# Patient Record
Sex: Male | Born: 1940 | Race: Black or African American | Hispanic: No | Marital: Married | Smoking: Former smoker
Health system: Southern US, Community
[De-identification: ages and names within clinical notes are randomized; demographics above are authoritative.]

## PROBLEM LIST (undated history)

## (undated) DIAGNOSIS — E785 Hyperlipidemia, unspecified: Secondary | ICD-10-CM

## (undated) DIAGNOSIS — I10 Essential (primary) hypertension: Secondary | ICD-10-CM

## (undated) DIAGNOSIS — I509 Heart failure, unspecified: Secondary | ICD-10-CM

## (undated) DIAGNOSIS — I2510 Atherosclerotic heart disease of native coronary artery without angina pectoris: Secondary | ICD-10-CM

## (undated) DIAGNOSIS — I4891 Unspecified atrial fibrillation: Secondary | ICD-10-CM

## (undated) DIAGNOSIS — D751 Secondary polycythemia: Secondary | ICD-10-CM

## (undated) DIAGNOSIS — I498 Other specified cardiac arrhythmias: Secondary | ICD-10-CM

## (undated) HISTORY — PX: CARDIAC CATHETERIZATION: SHX172

## (undated) HISTORY — DX: Atherosclerotic heart disease of native coronary artery without angina pectoris: I25.10

## (undated) HISTORY — PX: INSERT / REPLACE / REMOVE PACEMAKER: SUR710

## (undated) HISTORY — DX: Secondary polycythemia: D75.1

## (undated) HISTORY — DX: Unspecified atrial fibrillation: I48.91

## (undated) HISTORY — DX: Heart failure, unspecified: I50.9

## (undated) HISTORY — DX: Hyperlipidemia, unspecified: E78.5

## (undated) HISTORY — DX: Morbid (severe) obesity due to excess calories: E66.01

## (undated) HISTORY — DX: Other specified cardiac arrhythmias: I49.8

## (undated) HISTORY — DX: Essential (primary) hypertension: I10

---

## 2002-07-08 ENCOUNTER — Observation Stay (HOSPITAL_COMMUNITY): Admission: EM | Admit: 2002-07-08 | Discharge: 2002-07-08 | Payer: Self-pay | Admitting: Emergency Medicine

## 2002-07-08 ENCOUNTER — Encounter (INDEPENDENT_AMBULATORY_CARE_PROVIDER_SITE_OTHER): Payer: Self-pay | Admitting: Specialist

## 2002-07-08 ENCOUNTER — Encounter: Payer: Self-pay | Admitting: Emergency Medicine

## 2005-02-12 ENCOUNTER — Ambulatory Visit: Payer: Self-pay | Admitting: Internal Medicine

## 2005-02-19 ENCOUNTER — Ambulatory Visit: Payer: Self-pay

## 2005-03-06 ENCOUNTER — Ambulatory Visit: Payer: Self-pay | Admitting: Internal Medicine

## 2005-03-13 ENCOUNTER — Ambulatory Visit: Payer: Self-pay | Admitting: Cardiology

## 2005-03-20 ENCOUNTER — Ambulatory Visit (HOSPITAL_COMMUNITY): Admission: RE | Admit: 2005-03-20 | Discharge: 2005-03-20 | Payer: Self-pay | Admitting: Internal Medicine

## 2005-03-20 ENCOUNTER — Ambulatory Visit: Payer: Self-pay | Admitting: Internal Medicine

## 2005-03-28 ENCOUNTER — Ambulatory Visit: Payer: Self-pay | Admitting: Internal Medicine

## 2005-03-28 ENCOUNTER — Ambulatory Visit (HOSPITAL_BASED_OUTPATIENT_CLINIC_OR_DEPARTMENT_OTHER): Admission: RE | Admit: 2005-03-28 | Discharge: 2005-03-28 | Payer: Self-pay | Admitting: Internal Medicine

## 2005-04-02 ENCOUNTER — Ambulatory Visit: Payer: Self-pay | Admitting: Cardiology

## 2005-05-14 ENCOUNTER — Ambulatory Visit: Payer: Self-pay | Admitting: Internal Medicine

## 2005-05-28 ENCOUNTER — Ambulatory Visit: Payer: Self-pay | Admitting: Cardiology

## 2005-06-04 ENCOUNTER — Ambulatory Visit: Payer: Self-pay | Admitting: Internal Medicine

## 2005-06-10 ENCOUNTER — Inpatient Hospital Stay (HOSPITAL_COMMUNITY): Admission: RE | Admit: 2005-06-10 | Discharge: 2005-06-11 | Payer: Self-pay | Admitting: Internal Medicine

## 2005-06-11 ENCOUNTER — Ambulatory Visit: Payer: Self-pay | Admitting: Internal Medicine

## 2005-06-12 ENCOUNTER — Ambulatory Visit: Payer: Self-pay | Admitting: Internal Medicine

## 2005-06-19 ENCOUNTER — Ambulatory Visit: Payer: Self-pay

## 2005-06-25 ENCOUNTER — Ambulatory Visit: Payer: Self-pay | Admitting: Cardiology

## 2005-07-01 ENCOUNTER — Ambulatory Visit: Payer: Self-pay

## 2005-07-23 ENCOUNTER — Ambulatory Visit: Payer: Self-pay | Admitting: Internal Medicine

## 2005-08-13 ENCOUNTER — Ambulatory Visit: Payer: Self-pay | Admitting: Internal Medicine

## 2005-09-10 ENCOUNTER — Ambulatory Visit: Payer: Self-pay | Admitting: Internal Medicine

## 2005-10-04 ENCOUNTER — Ambulatory Visit: Payer: Self-pay | Admitting: Internal Medicine

## 2005-10-29 ENCOUNTER — Ambulatory Visit: Payer: Self-pay | Admitting: Internal Medicine

## 2006-01-28 ENCOUNTER — Ambulatory Visit: Payer: Self-pay | Admitting: Internal Medicine

## 2006-04-10 ENCOUNTER — Ambulatory Visit: Payer: Self-pay | Admitting: Internal Medicine

## 2006-04-23 ENCOUNTER — Ambulatory Visit: Payer: Self-pay

## 2006-04-23 ENCOUNTER — Encounter: Payer: Self-pay | Admitting: Cardiovascular Disease

## 2006-06-24 ENCOUNTER — Ambulatory Visit: Payer: Self-pay | Admitting: Internal Medicine

## 2006-07-01 ENCOUNTER — Ambulatory Visit: Payer: Self-pay | Admitting: Internal Medicine

## 2006-07-15 ENCOUNTER — Ambulatory Visit: Payer: Self-pay | Admitting: Internal Medicine

## 2006-08-12 ENCOUNTER — Ambulatory Visit: Payer: Self-pay | Admitting: Internal Medicine

## 2006-08-29 ENCOUNTER — Ambulatory Visit: Payer: Self-pay

## 2006-09-19 ENCOUNTER — Ambulatory Visit: Payer: Self-pay | Admitting: Cardiology

## 2006-11-17 ENCOUNTER — Ambulatory Visit: Payer: Self-pay | Admitting: Cardiology

## 2007-01-20 ENCOUNTER — Ambulatory Visit: Payer: Self-pay | Admitting: Cardiology

## 2007-01-20 LAB — CONVERTED CEMR LAB
ALT: 20 units/L (ref 0–40)
AST: 17 units/L (ref 0–37)
Albumin: 3.5 g/dL (ref 3.5–5.2)
Alkaline Phosphatase: 65 units/L (ref 39–117)
Calcium: 9.1 mg/dL (ref 8.4–10.5)
Chloride: 107 meq/L (ref 96–112)
Creatinine, Ser: 1.3 mg/dL (ref 0.4–1.5)
GFR calc non Af Amer: 59 mL/min
HDL: 44 mg/dL (ref >39.0)
Pro B Natriuretic peptide (BNP): 201 pg/mL — ABNORMAL HIGH (ref 0.0–100.0)
Sodium: 144 meq/L (ref 135–145)
Total Bilirubin: 0.6 mg/dL (ref 0.3–1.2)
VLDL: 17 mg/dL (ref 0–40)

## 2007-02-16 ENCOUNTER — Ambulatory Visit: Payer: Self-pay | Admitting: Internal Medicine

## 2007-02-25 ENCOUNTER — Ambulatory Visit: Payer: Self-pay | Admitting: Internal Medicine

## 2007-02-25 LAB — CONVERTED CEMR LAB
CO2: 35 meq/L — ABNORMAL HIGH (ref 19–32)
Calcium: 8.6 mg/dL (ref 8.4–10.5)
Chloride: 103 meq/L (ref 96–112)
Creatinine, Ser: 1.2 mg/dL (ref 0.4–1.5)
Glucose, Bld: 247 mg/dL — ABNORMAL HIGH (ref 70–99)
Sodium: 143 meq/L (ref 135–145)

## 2007-03-06 ENCOUNTER — Ambulatory Visit: Payer: Self-pay | Admitting: Internal Medicine

## 2007-03-06 LAB — CONVERTED CEMR LAB
CO2: 31 meq/L (ref 19–32)
Calcium: 8.6 mg/dL (ref 8.4–10.5)
Chloride: 104 meq/L (ref 96–112)
Creatinine, Ser: 1.4 mg/dL (ref 0.4–1.5)
GFR calc non Af Amer: 54 mL/min
Glucose, Bld: 190 mg/dL — ABNORMAL HIGH (ref 70–99)
Sodium: 142 meq/L (ref 135–145)

## 2007-05-19 ENCOUNTER — Ambulatory Visit: Payer: Self-pay | Admitting: Internal Medicine

## 2007-05-19 LAB — CONVERTED CEMR LAB
CO2: 30 meq/L (ref 19–32)
Chloride: 101 meq/L (ref 96–112)
Creatinine, Ser: 1.8 mg/dL — ABNORMAL HIGH (ref 0.4–1.5)
Potassium: 4 meq/L (ref 3.5–5.1)
Pro B Natriuretic peptide (BNP): 69 pg/mL (ref 0.0–100.0)
Sodium: 139 meq/L (ref 135–145)

## 2007-05-26 ENCOUNTER — Ambulatory Visit: Payer: Self-pay | Admitting: Internal Medicine

## 2007-05-26 ENCOUNTER — Ambulatory Visit (HOSPITAL_COMMUNITY): Admission: RE | Admit: 2007-05-26 | Discharge: 2007-05-26 | Payer: Self-pay | Admitting: Internal Medicine

## 2007-06-16 ENCOUNTER — Ambulatory Visit: Payer: Self-pay | Admitting: Internal Medicine

## 2007-06-17 ENCOUNTER — Ambulatory Visit: Payer: Self-pay | Admitting: Internal Medicine

## 2007-06-17 LAB — CONVERTED CEMR LAB
CO2: 31 meq/L (ref 19–32)
Calcium: 9 mg/dL (ref 8.4–10.5)
Chloride: 102 meq/L (ref 96–112)
Creatinine, Ser: 1.5 mg/dL (ref 0.4–1.5)
GFR calc non Af Amer: 50 mL/min
Glucose, Bld: 227 mg/dL — ABNORMAL HIGH (ref 70–99)
Sodium: 142 meq/L (ref 135–145)

## 2007-07-20 ENCOUNTER — Ambulatory Visit: Payer: Self-pay | Admitting: Internal Medicine

## 2007-07-21 ENCOUNTER — Ambulatory Visit: Payer: Self-pay | Admitting: Internal Medicine

## 2007-07-31 ENCOUNTER — Ambulatory Visit: Payer: Self-pay | Admitting: Cardiology

## 2007-08-07 ENCOUNTER — Ambulatory Visit: Payer: Self-pay | Admitting: Cardiology

## 2007-08-14 ENCOUNTER — Ambulatory Visit: Payer: Self-pay | Admitting: Cardiology

## 2007-08-20 ENCOUNTER — Ambulatory Visit: Payer: Self-pay

## 2007-08-26 ENCOUNTER — Ambulatory Visit: Payer: Self-pay | Admitting: Internal Medicine

## 2007-08-26 ENCOUNTER — Ambulatory Visit: Payer: Self-pay | Admitting: Cardiology

## 2007-09-08 ENCOUNTER — Encounter: Payer: Self-pay | Admitting: Internal Medicine

## 2007-09-08 ENCOUNTER — Ambulatory Visit: Payer: Self-pay

## 2007-09-09 ENCOUNTER — Ambulatory Visit: Payer: Self-pay | Admitting: Internal Medicine

## 2007-09-16 ENCOUNTER — Ambulatory Visit: Payer: Self-pay | Admitting: Cardiology

## 2007-10-14 ENCOUNTER — Ambulatory Visit: Payer: Self-pay | Admitting: Cardiovascular Disease

## 2007-10-20 ENCOUNTER — Ambulatory Visit: Payer: Self-pay | Admitting: Internal Medicine

## 2007-10-28 ENCOUNTER — Ambulatory Visit: Payer: Self-pay | Admitting: Internal Medicine

## 2007-11-18 ENCOUNTER — Ambulatory Visit: Payer: Self-pay | Admitting: Cardiology

## 2007-12-15 ENCOUNTER — Ambulatory Visit: Payer: Self-pay | Admitting: Internal Medicine

## 2008-01-05 ENCOUNTER — Ambulatory Visit: Payer: Self-pay | Admitting: Cardiology

## 2008-01-19 ENCOUNTER — Ambulatory Visit: Payer: Self-pay | Admitting: Internal Medicine

## 2008-01-26 ENCOUNTER — Ambulatory Visit: Payer: Self-pay | Admitting: Cardiology

## 2008-02-15 ENCOUNTER — Ambulatory Visit: Payer: Self-pay | Admitting: Cardiovascular Disease

## 2008-03-08 ENCOUNTER — Ambulatory Visit: Payer: Self-pay | Admitting: Cardiology

## 2008-03-29 ENCOUNTER — Ambulatory Visit: Payer: Self-pay | Admitting: Cardiology

## 2008-04-19 ENCOUNTER — Ambulatory Visit: Payer: Self-pay | Admitting: Internal Medicine

## 2008-04-19 ENCOUNTER — Ambulatory Visit: Payer: Self-pay | Admitting: Cardiology

## 2008-05-05 ENCOUNTER — Ambulatory Visit: Payer: Self-pay | Admitting: Internal Medicine

## 2008-05-17 ENCOUNTER — Ambulatory Visit: Payer: Self-pay | Admitting: Cardiology

## 2008-06-14 ENCOUNTER — Ambulatory Visit: Payer: Self-pay | Admitting: Cardiology

## 2008-07-12 ENCOUNTER — Ambulatory Visit: Payer: Self-pay | Admitting: Internal Medicine

## 2008-07-19 ENCOUNTER — Ambulatory Visit: Payer: Self-pay | Admitting: Internal Medicine

## 2008-08-09 ENCOUNTER — Ambulatory Visit: Payer: Self-pay | Admitting: Cardiology

## 2008-09-06 ENCOUNTER — Ambulatory Visit: Payer: Self-pay | Admitting: Cardiovascular Disease

## 2008-10-04 ENCOUNTER — Ambulatory Visit: Payer: Self-pay | Admitting: Cardiovascular Disease

## 2008-11-01 ENCOUNTER — Ambulatory Visit: Payer: Self-pay | Admitting: Cardiology

## 2008-11-22 ENCOUNTER — Encounter: Payer: Self-pay | Admitting: Internal Medicine

## 2008-11-22 ENCOUNTER — Ambulatory Visit: Payer: Self-pay | Admitting: Internal Medicine

## 2008-12-06 ENCOUNTER — Ambulatory Visit: Payer: Self-pay | Admitting: Cardiovascular Disease

## 2008-12-06 ENCOUNTER — Ambulatory Visit: Payer: Self-pay | Admitting: Internal Medicine

## 2008-12-06 ENCOUNTER — Encounter: Payer: Self-pay | Admitting: Internal Medicine

## 2008-12-12 ENCOUNTER — Encounter (INDEPENDENT_AMBULATORY_CARE_PROVIDER_SITE_OTHER): Payer: Self-pay | Admitting: *Deleted

## 2008-12-14 ENCOUNTER — Encounter: Payer: Self-pay | Admitting: Internal Medicine

## 2008-12-14 ENCOUNTER — Ambulatory Visit: Payer: Self-pay | Admitting: Internal Medicine

## 2008-12-14 DIAGNOSIS — I10 Essential (primary) hypertension: Secondary | ICD-10-CM | POA: Insufficient documentation

## 2008-12-14 DIAGNOSIS — I5022 Chronic systolic (congestive) heart failure: Secondary | ICD-10-CM

## 2008-12-14 DIAGNOSIS — I4891 Unspecified atrial fibrillation: Secondary | ICD-10-CM

## 2008-12-20 ENCOUNTER — Ambulatory Visit: Payer: Self-pay | Admitting: Cardiology

## 2008-12-23 ENCOUNTER — Ambulatory Visit: Payer: Self-pay

## 2008-12-23 ENCOUNTER — Encounter: Payer: Self-pay | Admitting: Internal Medicine

## 2009-01-03 ENCOUNTER — Ambulatory Visit: Payer: Self-pay | Admitting: Cardiovascular Disease

## 2009-01-17 ENCOUNTER — Ambulatory Visit: Payer: Self-pay | Admitting: Cardiovascular Disease

## 2009-02-01 ENCOUNTER — Ambulatory Visit: Payer: Self-pay | Admitting: Cardiology

## 2009-02-16 ENCOUNTER — Ambulatory Visit: Payer: Self-pay | Admitting: Cardiology

## 2009-03-07 ENCOUNTER — Ambulatory Visit: Payer: Self-pay | Admitting: Internal Medicine

## 2009-03-14 ENCOUNTER — Encounter: Payer: Self-pay | Admitting: *Deleted

## 2009-03-15 ENCOUNTER — Ambulatory Visit: Payer: Self-pay | Admitting: Cardiology

## 2009-03-15 LAB — CONVERTED CEMR LAB: POC INR: 2

## 2009-03-31 ENCOUNTER — Encounter: Payer: Self-pay | Admitting: Internal Medicine

## 2009-04-12 ENCOUNTER — Ambulatory Visit: Payer: Self-pay | Admitting: Cardiology

## 2009-04-12 LAB — CONVERTED CEMR LAB
POC INR: 3.3
Prothrombin Time: 22 s

## 2009-04-19 ENCOUNTER — Encounter: Payer: Self-pay | Admitting: *Deleted

## 2009-05-10 ENCOUNTER — Telehealth: Payer: Self-pay | Admitting: Internal Medicine

## 2009-05-10 ENCOUNTER — Ambulatory Visit: Payer: Self-pay | Admitting: Cardiovascular Disease

## 2009-05-10 LAB — CONVERTED CEMR LAB
POC INR: 3.4
Prothrombin Time: 22.3 s

## 2009-05-31 ENCOUNTER — Ambulatory Visit: Payer: Self-pay | Admitting: Internal Medicine

## 2009-06-06 ENCOUNTER — Ambulatory Visit: Payer: Self-pay | Admitting: Internal Medicine

## 2009-06-12 ENCOUNTER — Telehealth: Payer: Self-pay | Admitting: Internal Medicine

## 2009-06-21 ENCOUNTER — Encounter: Payer: Self-pay | Admitting: Internal Medicine

## 2009-06-21 ENCOUNTER — Ambulatory Visit: Payer: Self-pay

## 2009-07-19 ENCOUNTER — Ambulatory Visit: Payer: Self-pay | Admitting: Cardiology

## 2009-08-16 ENCOUNTER — Ambulatory Visit: Payer: Self-pay | Admitting: Cardiovascular Disease

## 2009-08-16 LAB — CONVERTED CEMR LAB: POC INR: 2.5

## 2009-09-12 ENCOUNTER — Ambulatory Visit: Payer: Self-pay | Admitting: Internal Medicine

## 2009-09-13 ENCOUNTER — Ambulatory Visit: Payer: Self-pay | Admitting: Cardiology

## 2009-09-18 ENCOUNTER — Encounter: Payer: Self-pay | Admitting: Internal Medicine

## 2009-09-21 ENCOUNTER — Ambulatory Visit: Payer: Self-pay | Admitting: Internal Medicine

## 2009-10-04 ENCOUNTER — Ambulatory Visit: Payer: Self-pay | Admitting: Cardiology

## 2009-10-04 ENCOUNTER — Ambulatory Visit: Payer: Self-pay

## 2009-10-04 ENCOUNTER — Encounter: Payer: Self-pay | Admitting: Internal Medicine

## 2009-10-04 ENCOUNTER — Ambulatory Visit (HOSPITAL_COMMUNITY): Admission: RE | Admit: 2009-10-04 | Discharge: 2009-10-04 | Payer: Self-pay | Admitting: Internal Medicine

## 2009-10-11 ENCOUNTER — Ambulatory Visit: Payer: Self-pay | Admitting: Cardiovascular Disease

## 2009-10-11 LAB — CONVERTED CEMR LAB: POC INR: 3

## 2009-11-01 ENCOUNTER — Encounter: Payer: Self-pay | Admitting: Internal Medicine

## 2009-11-02 ENCOUNTER — Ambulatory Visit: Payer: Self-pay | Admitting: Cardiology

## 2009-11-02 ENCOUNTER — Ambulatory Visit: Payer: Self-pay | Admitting: Internal Medicine

## 2009-11-02 LAB — CONVERTED CEMR LAB: POC INR: 1.9

## 2009-11-15 ENCOUNTER — Ambulatory Visit: Payer: Self-pay | Admitting: Internal Medicine

## 2009-11-15 ENCOUNTER — Encounter (INDEPENDENT_AMBULATORY_CARE_PROVIDER_SITE_OTHER): Payer: Self-pay

## 2009-11-15 LAB — CONVERTED CEMR LAB
BUN: 14 mg/dL (ref 6–23)
Basophils Absolute: 0.1 10*3/uL (ref 0.0–0.1)
CO2: 32 meq/L (ref 19–32)
Calcium: 8.4 mg/dL (ref 8.4–10.5)
Creatinine, Ser: 1.3 mg/dL (ref 0.4–1.5)
Eosinophils Absolute: 0.2 10*3/uL (ref 0.0–0.7)
Glucose, Bld: 237 mg/dL — ABNORMAL HIGH (ref 70–99)
INR: 1.9 — ABNORMAL HIGH (ref 0.8–1.0)
Lymphocytes Relative: 22.4 % (ref 12.0–46.0)
MCHC: 33 g/dL (ref 30.0–36.0)
MCV: 115 fL — ABNORMAL HIGH (ref 78.0–100.0)
Monocytes Absolute: 1 10*3/uL (ref 0.1–1.0)
Neutro Abs: 5.8 10*3/uL (ref 1.4–7.7)
Neutrophils Relative %: 63.4 % (ref 43.0–77.0)
RDW: 15.6 % — ABNORMAL HIGH (ref 11.5–14.6)
aPTT: 37.2 s — ABNORMAL HIGH (ref 21.7–28.8)

## 2009-11-22 ENCOUNTER — Ambulatory Visit (HOSPITAL_COMMUNITY): Admission: RE | Admit: 2009-11-22 | Discharge: 2009-11-23 | Payer: Self-pay | Admitting: Internal Medicine

## 2009-11-22 ENCOUNTER — Ambulatory Visit: Payer: Self-pay | Admitting: Internal Medicine

## 2009-11-30 ENCOUNTER — Ambulatory Visit: Payer: Self-pay | Admitting: Cardiovascular Disease

## 2009-12-06 ENCOUNTER — Encounter: Payer: Self-pay | Admitting: Internal Medicine

## 2009-12-06 ENCOUNTER — Ambulatory Visit: Payer: Self-pay

## 2009-12-28 ENCOUNTER — Ambulatory Visit: Payer: Self-pay | Admitting: Cardiology

## 2009-12-28 ENCOUNTER — Encounter (INDEPENDENT_AMBULATORY_CARE_PROVIDER_SITE_OTHER): Payer: Self-pay | Admitting: Cardiology

## 2010-01-25 ENCOUNTER — Ambulatory Visit: Payer: Self-pay | Admitting: Cardiology

## 2010-02-22 ENCOUNTER — Ambulatory Visit: Payer: Self-pay | Admitting: Internal Medicine

## 2010-02-22 LAB — CONVERTED CEMR LAB: POC INR: 2.2

## 2010-02-27 ENCOUNTER — Ambulatory Visit: Payer: Self-pay | Admitting: Internal Medicine

## 2010-02-27 DIAGNOSIS — Z950 Presence of cardiac pacemaker: Secondary | ICD-10-CM

## 2010-03-22 ENCOUNTER — Ambulatory Visit: Payer: Self-pay | Admitting: Cardiovascular Disease

## 2010-04-19 ENCOUNTER — Ambulatory Visit: Payer: Self-pay | Admitting: Internal Medicine

## 2010-04-19 LAB — CONVERTED CEMR LAB: POC INR: 2.1

## 2010-05-17 ENCOUNTER — Ambulatory Visit: Payer: Self-pay | Admitting: Internal Medicine

## 2010-05-17 LAB — CONVERTED CEMR LAB: POC INR: 2.7

## 2010-06-14 ENCOUNTER — Ambulatory Visit: Payer: Self-pay | Admitting: Cardiology

## 2010-06-14 LAB — CONVERTED CEMR LAB: POC INR: 2.2

## 2010-06-15 ENCOUNTER — Telehealth: Payer: Self-pay | Admitting: Internal Medicine

## 2010-06-20 ENCOUNTER — Telehealth: Payer: Self-pay | Admitting: Internal Medicine

## 2010-07-12 ENCOUNTER — Ambulatory Visit: Payer: Self-pay | Admitting: Cardiology

## 2010-07-18 ENCOUNTER — Ambulatory Visit: Payer: Self-pay | Admitting: Internal Medicine

## 2010-07-18 ENCOUNTER — Encounter: Payer: Self-pay | Admitting: Internal Medicine

## 2010-07-18 DIAGNOSIS — I959 Hypotension, unspecified: Secondary | ICD-10-CM

## 2010-07-19 ENCOUNTER — Encounter: Payer: Self-pay | Admitting: Internal Medicine

## 2010-08-09 ENCOUNTER — Ambulatory Visit: Payer: Self-pay | Admitting: Cardiology

## 2010-08-09 ENCOUNTER — Ambulatory Visit: Payer: Self-pay

## 2010-08-09 ENCOUNTER — Ambulatory Visit (HOSPITAL_COMMUNITY): Admission: RE | Admit: 2010-08-09 | Discharge: 2010-08-09 | Payer: Self-pay | Admitting: Cardiology

## 2010-08-09 ENCOUNTER — Encounter: Payer: Self-pay | Admitting: Cardiology

## 2010-08-16 ENCOUNTER — Ambulatory Visit: Payer: Self-pay | Admitting: Internal Medicine

## 2010-09-10 ENCOUNTER — Ambulatory Visit: Payer: Self-pay | Admitting: Internal Medicine

## 2010-09-10 LAB — CONVERTED CEMR LAB: POC INR: 2

## 2010-10-09 ENCOUNTER — Ambulatory Visit: Payer: Self-pay | Admitting: Cardiology

## 2010-10-09 LAB — CONVERTED CEMR LAB: POC INR: 2.5

## 2010-11-06 ENCOUNTER — Ambulatory Visit
Admission: RE | Admit: 2010-11-06 | Discharge: 2010-11-06 | Payer: Self-pay | Source: Home / Self Care | Attending: Cardiology | Admitting: Cardiology

## 2010-11-06 LAB — CONVERTED CEMR LAB: POC INR: 2.1

## 2010-11-11 LAB — CONVERTED CEMR LAB
Basophils Relative: 0.3 % (ref 0.0–3.0)
Calcium: 8.6 mg/dL (ref 8.4–10.5)
Creatinine, Ser: 1.4 mg/dL (ref 0.4–1.5)
Eosinophils Absolute: 0.2 10*3/uL (ref 0.0–0.7)
Eosinophils Relative: 1.8 % (ref 0.0–5.0)
GFR calc non Af Amer: 62.34 mL/min (ref >60)
Hemoglobin: 16.3 g/dL (ref 13.0–17.0)
Lymphocytes Relative: 20.6 % (ref 12.0–46.0)
Monocytes Relative: 11.2 % (ref 3.0–12.0)
Neutrophils Relative %: 66.1 % (ref 43.0–77.0)
Pro B Natriuretic peptide (BNP): 132.7 pg/mL — ABNORMAL HIGH (ref 0.0–100.0)
RBC: 4.17 M/uL — ABNORMAL LOW (ref 4.22–5.81)
Sodium: 140 meq/L (ref 135–145)
WBC: 11.4 10*3/uL — ABNORMAL HIGH (ref 4.5–10.5)

## 2010-11-15 ENCOUNTER — Encounter: Payer: Self-pay | Admitting: Internal Medicine

## 2010-11-15 ENCOUNTER — Ambulatory Visit: Admit: 2010-11-15 | Payer: Self-pay | Admitting: Internal Medicine

## 2010-11-15 NOTE — Medication Information (Signed)
Summary: rov/jb   Anticoagulant Therapy  Managed by: Louann Sjogren, PharmD Referring MD: Shirlee Limerick PCP: Augusto Garbe, MD Supervising MD: Eden Emms MD,Peter Indication 1: Atrial Fibrillation Lab Used: [facility] Site: [address] INR POC 2.1 INR RANGE 2 - 3  Dietary changes: no    Health status changes: no    Bleeding/hemorrhagic complications: no    Recent/future hospitalizations: no    Any changes in medication regimen? yes       Details: Cortisone shot 1 week ago in hip, did not stop coumadin  Recent/future dental: no  Any missed doses?: no       Is patient compliant with meds? yes       Allergies: No Known Drug Allergies  Anticoagulation Management History:      The patient is taking warfarin and comes in today for a routine follow up visit.  Positive risk factors for bleeding include an age of 70 years or older.  The bleeding index is 'intermediate risk'.  Positive CHADS2 values include History of CHF and History of HTN.  Negative CHADS2 values include Age > 31 years old.  The start date was 07/24/2007.  His last INR was 1.9 ratio and today's INR is 2.1.  Anticoagulation responsible provider: Eden Emms MD,Peter.  INR POC: 2.1.  Cuvette Lot#: [id number].  Exp: 09/2011.    Anticoagulation Management Assessment/Plan:      The patient's current anticoagulation dose is Coumadin 6 mg tabs: Take as directed by coumadin clinic..  The target INR is 2 - 3.  The next INR is due 12/04/2010.  Anticoagulation instructions were given to patient.  Results were reviewed/authorized by Louann Sjogren, PharmD.  He was notified by Louann Sjogren PharmD.         Prior Anticoagulation Instructions: Cont with current regimen Return to clinic on Jan 24th, at 845 am  Current Anticoagulation Instructions: INR 2.1 (goal 2-3)  Continue current regimen. Return to clinic in 4 weeks.

## 2010-11-15 NOTE — Medication Information (Signed)
Summary: [facility] Clinic  Anticoagulant Therapy  Managed by: Cloyde Reams, RN, BSN Referring MD: Shirlee Limerick PCP: Augusto Garbe, MD Supervising MD: Jens Som MD, Arlys John Indication 1: Atrial Fibrillation Lab Used: [facility] Site: [facility] INR POC 1.9 INR RANGE 2 - 3  Dietary changes: no    Health status changes: no    Bleeding/hemorrhagic complications: no    Recent/future hospitalizations: no    Any changes in medication regimen? no    Recent/future dental: no  Any missed doses?: no       Is patient compliant with meds? yes       Allergies: No Known Drug Allergies  Anticoagulation Management History:      The patient is taking warfarin and comes in today for a routine follow up visit.  Positive risk factors for bleeding include an age of 70 years or older.  The bleeding index is 'intermediate risk'.  Positive CHADS2 values include History of CHF and History of HTN.  Negative CHADS2 values include Age > 36 years old.  The start date was 07/24/2007.  His last INR was 1.9 ratio.  Anticoagulation responsible Candise Crabtree: Jens Som MD, Arlys John.  INR POC: 1.9.  Cuvette Lot#: [id number].  Exp: 09/2011.    Anticoagulation Management Assessment/Plan:      The patient's current anticoagulation dose is Coumadin 6 mg tabs: Take as directed by coumadin clinic..  The target INR is 2 - 3.  The next INR is due 09/10/2010.  Anticoagulation instructions were given to patient.  Results were reviewed/authorized by Cloyde Reams, RN, BSN.  He was notified by Cloyde Reams RN.         Prior Anticoagulation Instructions: INR 2.2  Continue taking 1 tablet everyday except take 1/2 tablet on Wednesdays. Recheck INR in 4 weeks.   Current Anticoagulation Instructions: INR 1.9  Take 1.5 tablets today, then resume same dosage 1 tablet daily except 1/2 tablet on Wednesdays.  Recheck in 4 weeks.

## 2010-11-15 NOTE — Letter (Signed)
Summary: Implantable Device Instructions  Architectural technologist, Main Office  [address]   [place], [place] [address]   Phone: [phone]  Fax: [phone]      Implantable Device Instructions  You are scheduled for:  Bi-V Pacemaker Change out  on 11/22/09 with Dr. Ladona Ridgel.  1.  Please arrive at the Short Stay Center at [facility] at 6:30am on the day of your procedure.  2.  Do not eat or drink after midnight the night before your procedure.  3.  Complete lab work on 11/15/09.  The lab at [facility] is open from 8:30 AM to 1:30 PM and from 2:30 PM to 5:00 PM.   You do not have to be fasting.  4.  Do NOT take these medications for the days of your procedure: Furosemide and Glipizide  Take your last dose of Coumadin on--will call if you need to hold  5.  Plan for an overnight stay.  Bring your insurance cards and a list of your medications.  6.  Wash your chest and neck with antibacterial soap (any brand) the evening before and the morning of your procedure.  Rinse well.  *If you have ANY questions after you get home, please call the office [phone].  Dennis Bast, RN  *Every attempt is made to prevent procedures from being rescheduled.  Due to the nauture of Electrophysiology, rescheduling can happen.  The physician is always aware and directs the staff when this occurs.

## 2010-11-15 NOTE — Medication Information (Signed)
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 no    Recent/future dental: no  Any missed doses?: no         Allergies: No Known Drug Allergies  Anticoagulation Management History:      The patient is taking warfarin and comes in today for a routine follow up visit.  Positive risk factors for bleeding include an age of 70 years or older.  The bleeding index is 'intermediate risk'.  Positive CHADS2 values include History of CHF and History of HTN.  Negative CHADS2 values include Age > 87 years old.  The start date was 07/24/2007.  His last INR was 1.9 ratio.  Anticoagulation responsible provider: Eden Emms MD, Theron Arista.  INR POC: 2.1.  Cuvette Lot#: [id number].  Exp: 01/2011.    Anticoagulation Management Assessment/Plan:      The patient's current anticoagulation dose is Coumadin 6 mg tabs: Take as directed by coumadin clinic..  The target INR is 2 - 3.  The next INR is due 12/28/2009.  Anticoagulation instructions were given to patient.  Results were reviewed/authorized by Eda Keys, PharmD.  He was notified by Eda Keys.         Prior Anticoagulation Instructions: INR 1.9 Today take 9mg ( 1.5 tablet ) then resume 6mg s everyday except 3mg s on Wednesdays. Recheck in 4 weeks.   Current Anticoagulation Instructions: INR 2.1  Continue current dosing schedule.  Take 1/2 tablet on Wednesday and take 1 tablet all other days.  Return to clinic in 4 weeks.

## 2010-11-15 NOTE — Medication Information (Signed)
Summary: rov/tm   Anticoagulant Therapy  Managed by: Weston Brass, PharmD Referring MD: Shirlee Limerick PCP: Augusto Garbe, MD Supervising MD: Jens Som MD, Arlys John Indication 1: Atrial Fibrillation Lab Used: [facility] Site: [facility] INR POC 2.2 INR RANGE 2 - 3  Dietary changes: no    Health status changes: no    Bleeding/hemorrhagic complications: no    Recent/future hospitalizations: no    Any changes in medication regimen? no    Recent/future dental: no  Any missed doses?: no       Is patient compliant with meds? yes       Allergies: No Known Drug Allergies  Anticoagulation Management History:      The patient is taking warfarin and comes in today for a routine follow up visit.  Positive risk factors for bleeding include an age of 70 years or older.  The bleeding index is 'intermediate risk'.  Positive CHADS2 values include History of CHF and History of HTN.  Negative CHADS2 values include Age > 59 years old.  The start date was 07/24/2007.  His last INR was 1.9 ratio.  Anticoagulation responsible Aarna Mihalko: Jens Som MD, Arlys John.  INR POC: 2.2.  Cuvette Lot#: [id number].  Exp: 08/2011.    Anticoagulation Management Assessment/Plan:      The patient's current anticoagulation dose is Coumadin 6 mg tabs: Take as directed by coumadin clinic..  The target INR is 2 - 3.  The next INR is due 08/09/2010.  Anticoagulation instructions were given to patient.  Results were reviewed/authorized by Weston Brass, PharmD.  He was notified by Harrel Carina, PharmD candidate.         Prior Anticoagulation Instructions: INR 2.2 Continue 6mg s everyday except 3mg s on Wednesdays. Recheck in 4 weeks.   Current Anticoagulation Instructions: INR 2.2  Continue taking 1 tablet everyday except take 1/2 tablet on Wednesdays. Recheck INR in 4 weeks.

## 2010-11-15 NOTE — Medication Information (Signed)
Summary: [name]   Anticoagulant Therapy  Managed by: Minerva Areola, RN Referring MD: Shirlee Limerick PCP: Augusto Garbe, MD Supervising MD: Eden Emms MD, Theron Arista Indication 1: Atrial Fibrillation Lab Used: [facility] Site: [address] INR POC 2.4 INR RANGE 2 - 3  Dietary changes: no    Health status changes: no    Bleeding/hemorrhagic complications: no    Recent/future hospitalizations: no    Any changes in medication regimen? no    Recent/future dental: no  Any missed doses?: no       Is patient compliant with meds? yes       Allergies: No Known Drug Allergies  Anticoagulation Management History:      The patient is taking warfarin and comes in today for a routine follow up visit.  Positive risk factors for bleeding include an age of 70 years or older.  The bleeding index is 'intermediate risk'.  Positive CHADS2 values include History of CHF and History of HTN.  Negative CHADS2 values include Age > 32 years old.  The start date was 07/24/2007.  His last INR was 1.9 ratio.  Anticoagulation responsible provider: Eden Emms MD, Theron Arista.  INR POC: 2.4.  Cuvette Lot#: [id number].  Exp: 05/2011.    Anticoagulation Management Assessment/Plan:      The patient's current anticoagulation dose is Coumadin 6 mg tabs: Take as directed by coumadin clinic..  The target INR is 2 - 3.  The next INR is due 04/19/2010.  Anticoagulation instructions were given to patient.  Results were reviewed/authorized by Minerva Areola, RN.  He was notified by Minerva Areola, RN, BSN.         Prior Anticoagulation Instructions: INR 2.2  Continue takign 1/2 tablet on Wednesday adn 1 tablet all other days.  Return to clinic in 4 weeks.    Current Anticoagulation Instructions: The patient is to continue with the same dose of coumadin.  This dosage includes: one tablet every day except a half a tablet on Wednesdays.

## 2010-11-15 NOTE — Medication Information (Signed)
Summary: rov/ewj  Anticoagulant Therapy  Managed by: Bethena Midget, RN, BSN Referring MD: Shirlee Limerick PCP: Augusto Garbe, MD Supervising MD: Jens Som MD, Arlys John Indication 1: Atrial Fibrillation Lab Used: [facility] Site: [address] INR POC 2.2 INR RANGE 2 - 3  Dietary changes: no    Health status changes: yes       Details: Severe left hip pain, Dx via X-[facility] at [facility] with Arthritis  Bleeding/hemorrhagic complications: no    Recent/future hospitalizations: no    Any changes in medication regimen? no    Recent/future dental: no  Any missed doses?: no       Is patient compliant with meds? yes       Allergies: No Known Drug Allergies  Anticoagulation Management History:      The patient is taking warfarin and comes in today for a routine follow up visit.  Positive risk factors for bleeding include an age of 70 years or older.  The bleeding index is 'intermediate risk'.  Positive CHADS2 values include History of CHF and History of HTN.  Negative CHADS2 values include Age > 71 years old.  The start date was 07/24/2007.  His last INR was 1.9 ratio.  Anticoagulation responsible provider: Jens Som MD, Arlys John.  INR POC: 2.2.  Cuvette Lot#: [id number].  Exp: 07/2011.    Anticoagulation Management Assessment/Plan:      The patient's current anticoagulation dose is Coumadin 6 mg tabs: Take as directed by coumadin clinic..  The target INR is 2 - 3.  The next INR is due 07/12/2010.  Anticoagulation instructions were given to patient.  Results were reviewed/authorized by Bethena Midget, RN, BSN.  He was notified by Bethena Midget, RN, BSN.         Prior Anticoagulation Instructions: INR 2.7  Continue on same dosage 1 tablet daily except 1/2 tablet on Wednesdays.  Recheck in 4 weeks.    Current Anticoagulation Instructions: INR 2.2 Continue 6mg s everyday except 3mg s on Wednesdays. Recheck in 4 weeks.

## 2010-11-15 NOTE — Medication Information (Signed)
Summary: rov/tm  Anticoagulant Therapy  Managed by: Bethena Midget, RN, BSN Referring MD: Shirlee Limerick Supervising MD: Riley Kill MD, Maisie Fus Indication 1: Atrial Fibrillation Lab Used: [facility] Site: [facility] INR POC 1.9 INR RANGE 2 - 3  Dietary changes: yes       Details: Has been eating more leafy green vegetables.   Health status changes: no    Bleeding/hemorrhagic complications: no    Recent/future hospitalizations: no    Any changes in medication regimen? no    Recent/future dental: no  Any missed doses?: no       Is patient compliant with meds? yes      Comments: Seeing Dr. Ladona Ridgel today.   Allergies: No Known Drug Allergies  Anticoagulation Management History:      The patient is taking warfarin and comes in today for a routine follow up visit.  Positive risk factors for bleeding include an age of 70 years or older.  The bleeding index is 'intermediate risk'.  Positive CHADS2 values include History of CHF and History of HTN.  Negative CHADS2 values include Age > 25 years old.  The start date was 07/24/2007.  His last INR was 2.3.  Anticoagulation responsible provider: Riley Kill MD, Maisie Fus.  INR POC: 1.9.  Cuvette Lot#: [id number].  Exp: 11/2010.    Anticoagulation Management Assessment/Plan:      The patient's current anticoagulation dose is Coumadin 6 mg tabs: Take as directed by coumadin clinic..  The target INR is 2 - 3.  The next INR is due 11/30/2009.  Anticoagulation instructions were given to patient.  Results were reviewed/authorized by Bethena Midget, RN, BSN.  He was notified by Bethena Midget, RN, BSN.         Prior Anticoagulation Instructions: INR 3.0 Continue 6mg s everyday except 3mg s on Wednesdays. Recheck in 4 weeks.   Current Anticoagulation Instructions: INR 1.9 Today take 9mg ( 1.5 tablet ) then resume 6mg s everyday except 3mg s on Wednesdays. Recheck in 4 weeks.

## 2010-11-15 NOTE — Medication Information (Signed)
Summary: rov/ewj   Anticoagulant Therapy  Managed by: Lyna Poser, PharmD Referring MD: Shirlee Limerick PCP: Augusto Garbe, MD Supervising MD: Tenny Craw MD,Paula Indication 1: Atrial Fibrillation Lab Used: [facility] Site: [address] INR POC 2 INR RANGE 2 - 3  Dietary changes: no    Health status changes: yes       Details: hip pain  Bleeding/hemorrhagic complications: no    Recent/future hospitalizations: no    Any changes in medication regimen? no    Recent/future dental: no  Any missed doses?: no       Is patient compliant with meds? yes       Allergies: No Known Drug Allergies  Anticoagulation Management History:      The patient is taking warfarin and comes in today for a routine follow up visit.  Positive risk factors for bleeding include an age of 70 years or older.  The bleeding index is 'intermediate risk'.  Positive CHADS2 values include History of CHF and History of HTN.  Negative CHADS2 values include Age > 78 years old.  The start date was 07/24/2007.  His last INR was 1.9 ratio.  Anticoagulation responsible provider: Tenny Craw MD,Paula.  INR POC: 2.  Cuvette Lot#: [id number].  Exp: 09/2011.    Anticoagulation Management Assessment/Plan:      The patient's current anticoagulation dose is Coumadin 6 mg tabs: Take as directed by coumadin clinic..  The target INR is 2 - 3.  The next INR is due 10/09/2010.  Anticoagulation instructions were given to patient.  Results were reviewed/authorized by Lyna Poser, PharmD.         Prior Anticoagulation Instructions: INR 1.9  Take 1.5 tablets today, then resume same dosage 1 tablet daily except 1/2 tablet on Wednesdays.  Recheck in 4 weeks.    Current Anticoagulation Instructions: INR 2 Continue taking a half tablet on wednesday. And 1 tablet all other days. Recheck in 4 weeks.

## 2010-11-15 NOTE — Medication Information (Signed)
Summary: rov/sp  Anticoagulant Therapy  Managed by: Eda Keys, PharmD Referring MD: Shirlee Limerick PCP: Augusto Garbe, MD Supervising MD: [name] MD, Reuel Boom Indication 1: Atrial Fibrillation Lab Used: [facility] Site: [address] INR POC 2.2 INR RANGE 2 - 3  Dietary changes: no    Health status changes: no    Bleeding/hemorrhagic complications: no    Recent/future hospitalizations: no    Any changes in medication regimen? no    Recent/future dental: no  Any missed doses?: no       Is patient compliant with meds? yes       Allergies: No Known Drug Allergies  Anticoagulation Management History:      The patient is taking warfarin and comes in today for a routine follow up visit.  Positive risk factors for bleeding include an age of 70 years or older.  The bleeding index is 'intermediate risk'.  Positive CHADS2 values include History of CHF and History of HTN.  Negative CHADS2 values include Age > 2 years old.  The start date was 07/24/2007.  His last INR was 1.9 ratio.  Anticoagulation responsible provider: Marsden Zaino MD, Reuel Boom.  INR POC: 2.2.  Cuvette Lot#: [id number].  Exp: 05/2011.    Anticoagulation Management Assessment/Plan:      The patient's current anticoagulation dose is Coumadin 6 mg tabs: Take as directed by coumadin clinic..  The target INR is 2 - 3.  The next INR is due 03/22/2010.  Anticoagulation instructions were given to patient.  Results were reviewed/authorized by Eda Keys, PharmD.  He was notified by Eda Keys.         Prior Anticoagulation Instructions: INR 2.1  Continue same dose of 1 tablet every day except 1/2 tablet on Wednesday   Current Anticoagulation Instructions: INR 2.2  Continue takign 1/2 tablet on Wednesday adn 1 tablet all other days.  Return to clinic in 4 weeks.    Appended Document: [facility]     CXR  Procedure date:  11/23/2009  Findings:        Clinical Data: Left ventricular dysfunction.  Post  pacemaker   insertion.    CHEST - 2 VIEW    Comparison: 06/11/2005.    Findings: In the interval since the prior exam, the pacemaker power   pack has been changed and an additional right ventricular apex   pacing lead has been placed.  Old AICD leads and coronary sinus   lead remain unchanged.  Right atrial appendage lead is also   unchanged.  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  Mild right basilar atelectasis.  Mediastinal contours   unchanged.  No pneumothorax.    IMPRESSION:   Uncomplicated revision of left subclavian pacemaker.  No   pneumothorax.    Read By:  Wynn Banker   Released By:  Wynn Banker

## 2010-11-15 NOTE — Assessment & Plan Note (Signed)
Summary: 1 YR F/U [employer]      Allergies Added: NKDA  Visit Type:  Follow-up Primary Provider:  Augusto Garbe, MD   History of Present Illness: [name] returns today for followup.  He is a pleasant 70 yo man with a h/o DCM, CHF, HTN, and obesity.  The patient initially had an EF of 20-25% but after BiV ICD implant and uptitration of meds, has an EF of 40-50%.  His CHF remains class 1-2.  He remains active.  He has experienced no intercurrent ICD therapies.  No peripheral edema. His ICD has reached ERI.  Current Medications (verified): 1)  Digoxin 0.125 Mg Tabs (Digoxin) .... Take 1  Tablet By Mouth Daily 2)  Carvedilol 25 Mg Tabs (Carvedilol) .... Take 2 Tablet By Mouth Twice A Day 3)  Aspirin 81 Mg Tbec (Aspirin) .... Take One Tablet By Mouth Daily 4)  Simvastatin 20 Mg Tabs (Simvastatin) .... Take One Tablet By Mouth Daily At Bedtime 5)  Furosemide 80 Mg Tabs (Furosemide) .... Take One Tablet By Mouth Two Times A Day 6)  Allopurinol 300 Mg Tabs (Allopurinol) .... Take 1 Tab Every Day 7)  Glipizide Xl 5 Mg Tb24 (Glipizide) .... 2  Tablet By Mouth Two Times A Day 8)  Benazepril Hcl 40 Mg  Tabs (Benazepril Hcl) .Marland Kitchen.. 1 Tablet By Mouth Daily 9)  Terazosin Hcl 2 Mg  Caps (Terazosin Hcl) .Marland Kitchen.. 1 By Mouth At Bedtime 10)  Coumadin 6 Mg Tabs (Warfarin Sodium) .... Take As Directed By Coumadin Clinic. 11)  Viagra 100 Mg Tabs (Sildenafil Citrate) .... As Needed  Allergies (verified): No Known Drug Allergies  Past History:  Past Medical History: Last updated: 09/21/2009  1. Congestive heart failure secondary to nonischemic cardiomyopathy       a. EF currently 50% by ECHO 11/08 (previous 30-35%)       b. status post Medtronict biventricular ICD placement.(with Sprint Fidelis lead)       c. CPX with peak VO2 9.5  2. Morbid obesity.  3. Hypertension.  4. Hyperlipidemia.  5. Diabetes.  6. 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  [facility]  7. History of wandering atrial pacemaker.  8. Chronic atrial fibrillation  Review of Systems  The patient denies chest pain, syncope, dyspnea on exertion, and peripheral edema.    Vital Signs:  Patient profile:   70 year old male Height:      75 inches Weight:      274 pounds BMI:     34.37 Pulse rate:   76 / minute BP sitting:   96 / 82  Vitals Entered By: Laurance Flatten CMA (November 02, 2009 11:24 AM)  Physical Exam  General:  Gen: well appearing. no resp difficulty HEENT: normal Neck: supple. no JVD. Carotids 2+ bilat; not bruits. No lymphadenopathy or thryomegaly appreciated. Cor: PMI nonpalpabe. Regular rate & rhythm. No rubs, gallops, murmur. Lungs: clear Abdomen: soft, nontender, nondistended. No hepatosplenomegaly. No bruits or masses. Good bowel sounds. Extremities: no cyanosis, clubbing, rash, edema Neuro: alert & orientedx3, cranial nerves grossly intact. moves all 4 extremities w/o difficulty. affect pleasant     ICD Specifications ICD Vendor:  Medtronic     ICD Model Number:  7304     ICD Serial Number:  [id number] H ICD DOI:  06/10/2005      Lead 1:    Location: RA  DOI: 06/10/2005     Model #: 2130     Serial #: [id number]     Status: active Lead 2:    Location: RV     DOI: 06/10/2005     Model #: 9528     Serial #: [id number]     Status: active Lead 3:    Location: LV     DOI: 06/10/2005     Model #: 7253     Serial #: [id number]     Status: active  Indications::  NICM  Explantation Comments: LIA [facility] MD Comments:  Review of old records demonstrates that his only ICD shock was for double counting of the T wave.  Impression & Recommendations:  Problem # 1:  AUTOMATIC IMPLANTABLE CARDIAC DEFIBRILLATOR SITU (ICD-V45.02) His device is working normally but is at [employer].  AS his LV function has almost normalized, his CHF is well controlled and he has had no sustained VT, I have recommended BiV PPM.  This will be  scheduled in several weeks.  Problem # 2:  ATRIAL FIBRILLATION (ICD-427.31) No syptoms and none seen on interogation of his device. His updated medication list for this problem includes:    Digoxin 0.125 Mg Tabs (Digoxin) .Marland Kitchen... Take 1  tablet by mouth daily    Carvedilol 25 Mg Tabs (Carvedilol) .Marland Kitchen... Take 2 tablet by mouth twice a day    Aspirin 81 Mg Tbec (Aspirin) .Marland Kitchen... Take one tablet by mouth daily    Coumadin 6 Mg Tabs (Warfarin sodium) .Marland Kitchen... Take as directed by coumadin clinic.  Problem # 3:  SYSTOLIC HEART FAILURE, CHRONIC (ICD-428.22) His symptoms are well controlled.  Will continue current meds and a low sodium diet is recommended. His updated medication list for this problem includes:    Digoxin 0.125 Mg Tabs (Digoxin) .Marland Kitchen... Take 1  tablet by mouth daily    Carvedilol 25 Mg Tabs (Carvedilol) .Marland Kitchen... Take 2 tablet by mouth twice a day    Aspirin 81 Mg Tbec (Aspirin) .Marland Kitchen... Take one tablet by mouth daily    Furosemide 80 Mg Tabs (Furosemide) .Marland Kitchen... Take one tablet by mouth two times a day    Benazepril Hcl 40 Mg Tabs (Benazepril hcl) .Marland Kitchen... 1 tablet by mouth daily    Coumadin 6 Mg Tabs (Warfarin sodium) .Marland Kitchen... Take as directed by coumadin clinic.

## 2010-11-15 NOTE — Assessment & Plan Note (Signed)
Summary: 9 mo f/u  Medications Added CARVEDILOL 25 MG TABS (CARVEDILOL) ONE TWICE A DAY FUROSEMIDE 20 MG TABS (FUROSEMIDE) ONE DAILY TRAMADOL HCL 50 MG TABS (TRAMADOL HCL) 1 by mouth as needed      Allergies Added: NKDA  Primary Provider:  Augusto Garbe, MD   History of Present Illness: [name] returns today for followup.  He is a pleasant 70 yo man with a h/o DCM, CHF, HTN, atrial arrythmias and obesity.  The patient initially had an EF of 20-25% but after BiV ICD implant and uptitration of meds, has an EF of 40-50%.  His CHF remains class 1-2. With improvement in his LV function, he had a BiV PM placed earlier this year.    Says overall he is doing well.  Denies c/p.  No significant edema. No PND or orthopnea. Wife says he is very sedentary and she thinks he is not doing as well. Main problems is pain in left hip. When he goes out he is using a wheelchair. No syncope. presyncope or palpitations.   Preventive Screening-Counseling & Management  Comments: BS 117 IV NS infusing into right anticubal without difficulty Repeat BP 129/88 with large cuff, left arm.  Current Medications (verified): 1)  Digoxin 0.125 Mg Tabs (Digoxin) .... Take 1  Tablet By Mouth Daily 2)  Carvedilol 25 Mg Tabs (Carvedilol) .... Take 2 Tablet By Mouth Twice A Day 3)  Aspirin 81 Mg Tbec (Aspirin) .... Take One Tablet By Mouth Daily 4)  Simvastatin 20 Mg Tabs (Simvastatin) .... Take One Tablet By Mouth Daily At Bedtime 5)  Furosemide 80 Mg Tabs (Furosemide) .... Take One Tablet By Mouth Two Times A Day 6)  Allopurinol 300 Mg Tabs (Allopurinol) .... Take 1 Tab Every Day 7)  Glipizide Xl 5 Mg Tb24 (Glipizide) .... 2  Tablet By Mouth Two Times A Day 8)  Benazepril Hcl 40 Mg  Tabs (Benazepril Hcl) .Marland Kitchen.. 1 Tablet By Mouth Daily 9)  Terazosin Hcl 2 Mg  Caps (Terazosin Hcl) .Marland Kitchen.. 1 By Mouth At Bedtime 10)  Coumadin 6 Mg Tabs (Warfarin Sodium) .... Take As Directed By Coumadin Clinic. 11)  Viagra 100 Mg Tabs (Sildenafil  Citrate) .... As Needed 12)  Bayer Breeze 2 Test  Disk (Glucose Blood) .... Test Sugar Three Times A Day 13)  Tramadol Hcl 50 Mg Tabs (Tramadol Hcl) .Marland Kitchen.. 1 By Mouth As Needed  Allergies (verified): No Known Drug Allergies  Past History:  Past Medical History: Last updated: 09/21/2009  1. Congestive heart failure secondary to nonischemic cardiomyopathy       a. EF currently 50% by ECHO 11/08 (previous 30-35%)       b. status post Medtronict biventricular ICD placement.(with Sprint Fidelis lead)       c. CPX with peak VO2 9.5  2. Morbid obesity.  3. Hypertension.  4. Hyperlipidemia.  5. Diabetes.  6. 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  [facility]  7. History of wandering atrial pacemaker.  8. Chronic atrial fibrillation  Review of Systems       As per HPI and past medical history; otherwise all systems negative.   Vital Signs:  Patient profile:   70 year old male Height:      75 inches Weight:      264 pounds BMI:     33.12 Pulse rate:   72 / minute Resp:  18 per minute BP sitting:   60 /   Vitals Entered By: Chad Coy, CNA (July 18, 2010 2:18 PM)   Serial Vital Signs/Assessments:  Time      Position  BP       Pulse  Resp  Temp     By                     60/                            Chad Coy, CNA  Comments: Dopplered By: Chad Coy, CNA    Physical Exam  General:  weak appearing. no resp difficulty. skin damp HEENT: normal Neck: supple. no JVD. Carotids 2+ bilat; not bruits. No lymphadenopathy or thryomegaly appreciated. Cor: PMI nonpalpabe. Regular rate & rhythm. No rubs, gallops, murmur. Lungs: clear. Abdomen: soft, nontender, nondistended. No hepatosplenomegaly. No bruits or masses. Good bowel sounds. Extremities: no cyanosis, clubbing, rash, edema Neuro: alert & orientedx3, cranial nerves grossly intact. moves all 4 extremities w/o  difficulty. affect pleasant    PPM Specifications Following MD:  Chad Bunting, MD     PPM Vendor:  St Jude     PPM Model Number:  [phone]     PPM Serial Number:  [id number] PPM DOI:  11/22/2009     PPM Implanting MD:  Chad Bunting, MD  Lead 1    Location: RA     DOI: 06/10/2005     Model #: 4782     Serial #: [id number]     Status: active Lead 2    Location: RV     DOI: 11/22/2009     Model #: [id number]     Serial #: [id number]     Status: active Lead 3    Location: LV     DOI: 06/10/2005     Model #: 4194     Serial #: [id number]     Status: active  Magnet Response Rate:  BOL 100 ERI  85  Indications:  CM   PPM Follow Up Pacer Dependent:  No      Episodes Coumadin:  Yes  Parameters Mode:  DDIR     Lower Rate Limit:  70     Upper Rate Limit:  120 Paced AV Delay:  170      ICD Specifications Following MD:  Chad Bunting, MD     ICD Vendor:  Medtronic     ICD Model Number:  7304     ICD Serial Number:  [id number] H ICD DOI:  06/10/2005      Lead 1:    Location: RA     DOI: 06/10/2005     Model #: 4259     Serial #: [id number]     Status: active Lead 2:    Location: RV     DOI: 06/10/2005     Model #: 2951     Serial #: [id number]     Status: active Lead 3:    Location: LV     DOI: 06/10/2005     Model #: 4194     Serial #: [id number]     Status: active  Indications::  NICM  Explantation Comments: 11/22/2009 Medtronic [id number] h explanted  Episodes Coumadin:  Yes  Brady Parameters Mode DDIR     Lower Rate Limit:  70     PAV 170     Impression & Recommendations:  Problem # 1:  HYPOTENSION, UNSPECIFIED (ICD-458.9) SBP in 60s by Doppler. Will place IV and give 2 liters NS. Cut lasix back from 40 once daily to 20 once daily. Decrease carvedilol to 25 two times a day. Check BMET and CBC. Will recehck echo.  Problem # 2:  SYSTOLIC HEART FAILURE, CHRONIC (ICD-428.22) EF improved. Hard to assess functional capacity due to ortho problems and his minimizing symptoms. suspect he is class  III.   Problem # 3:  ATRIAL FIBRILLATION (ICD-427.31) Chronic. Rate controlled. Continue coumadin.   Other Orders: EKG w/ Interpretation (93000) T-Digoxin (11914-78295) TLB-BNP (B-Natriuretic Peptide) (83880-BNPR) TLB-BMP (Basic Metabolic Panel-BMET) (80048-METABOL) TLB-CBC Platelet - w/Differential (85025-CBCD) Echocardiogram (Echo)  Patient Instructions: 1)  Your physician recommends that you schedule a follow-up appointment in: 1 month with Dr [name] 2)  Your physician recommends that you  have  lab work today BNP,BMP, dig level and CBC 3)  Your physician has recommended you make the following change in your medication: Decrease Furosemide to 20 mg a day  and carvediolol 25 mg twice a day 4)  Your physician has requested that you have an echocardiogram.  Echocardiography is a painless test that uses sound waves to create images of your heart. It provides your doctor with information about the size and shape of your heart and how well your heart's chambers and valves are working.  This procedure takes approximately one hour. There are no restrictions for this procedure. Prescriptions: FUROSEMIDE 20 MG TABS (FUROSEMIDE) ONE DAILY  #30 x 6   Entered by:   Charolotte Capuchin, RN   Authorized by:   Dolores Patty, MD, [facility]   Signed by:   Charolotte Capuchin, RN on 07/18/2010   Method used:   Electronically to        [facility] [phone]* (retail)       [address]       [place], [place]  [address]       Ph: [phone] or [phone]       Fax: [phone]   RxID:   [id number]

## 2010-11-15 NOTE — Medication Information (Signed)
Summary: rov/ln  Anticoagulant Therapy  Managed by: Cloyde Reams, RN, BSN Referring MD: Shirlee Limerick PCP: Augusto Garbe, MD Supervising MD: Tenny Craw MD, Gunnar Fusi Indication 1: Atrial Fibrillation Lab Used: [facility] Site: [facility] INR POC 2.7 INR RANGE 2 - 3  Dietary changes: no    Health status changes: no    Bleeding/hemorrhagic complications: no    Recent/future hospitalizations: no    Any changes in medication regimen? no    Recent/future dental: no  Any missed doses?: no       Is patient compliant with meds? yes       Allergies: No Known Drug Allergies  Anticoagulation Management History:      The patient is taking warfarin and comes in today for a routine follow up visit.  Positive risk factors for bleeding include an age of 71 years or older.  The bleeding index is 'intermediate risk'.  Positive CHADS2 values include History of CHF and History of HTN.  Negative CHADS2 values include Age > 71 years old.  The start date was 07/24/2007.  His last INR was 1.9 ratio.  Anticoagulation responsible Yunis Voorheis: Tenny Craw MD, Gunnar Fusi.  INR POC: 2.7.  Cuvette Lot#: [id number].  Exp: 07/2011.    Anticoagulation Management Assessment/Plan:      The patient's current anticoagulation dose is Coumadin 6 mg tabs: Take as directed by coumadin clinic..  The target INR is 2 - 3.  The next INR is due 06/14/2010.  Anticoagulation instructions were given to patient.  Results were reviewed/authorized by Cloyde Reams, RN, BSN.  He was notified by Cloyde Reams RN.         Prior Anticoagulation Instructions: INR 2.1  Continue same dose of 0.5 tab on Wednesday and 1 tab all other days.    Current Anticoagulation Instructions: INR 2.7  Continue on same dosage 1 tablet daily except 1/2 tablet on Wednesdays.  Recheck in 4 weeks.

## 2010-11-15 NOTE — Progress Notes (Signed)
Summary: talk to nurse   Phone Note Call from Patient   Caller: Patient Reason for Call: Talk to Nurse Summary of Call: pt wants to add a med to his med list-tramadol 50 mg 1 tab 3 x a day-also wants a nurse call-pls call [phone] Initial call taken by: Glynda Jaeger,  June 15, 2010 9:43 AM  Follow-up for Phone Call        Spoke with pt. Pt called to make sure tramadol 50 mg is safe for him to take. Pt's PCP prescrived the medication yesterday for back pain. I re-assured pt. that Tramadol is a mild pain medication it works in the muscle pain and is safe to take. Pt. verbalized understanding Follow-up by: Ollen Gross, RN, BSN,  June 15, 2010 10:01 AM

## 2010-11-15 NOTE — Medication Information (Signed)
Summary: rov/eac  Anticoagulant Therapy  Managed by: [name], PharmD, BCPS, CPP Referring MD: Shirlee Limerick PCP: Augusto Garbe, MD Supervising MD: Shirlee Latch MD, Dalton Indication 1: Atrial Fibrillation Lab Used: [facility] Site: [facility] INR POC 2.2 INR RANGE 2 - 3  Dietary changes: no    Health status changes: no    Bleeding/hemorrhagic complications: no    Recent/future hospitalizations: no    Any changes in medication regimen? no    Recent/future dental: no  Any missed doses?: no       Is patient compliant with meds? yes       Allergies (verified): No Known Drug Allergies  Anticoagulation Management History:      The patient is taking warfarin and comes in today for a routine follow up visit.  Positive risk factors for bleeding include an age of 35 years or older.  The bleeding index is 'intermediate risk'.  Positive CHADS2 values include History of CHF and History of HTN.  Negative CHADS2 values include Age > 37 years old.  The start date was 07/24/2007.  His last INR was 1.9 ratio.  Anticoagulation responsible provider: Shirlee Latch MD, Dalton.  INR POC: 2.2.  Cuvette Lot#: [id number].  Exp: 02/2011.    Anticoagulation Management Assessment/Plan:      The patient's current anticoagulation dose is Coumadin 6 mg tabs: Take as directed by coumadin clinic..  The target INR is 2 - 3.  The next INR is due 01/25/2010.  Anticoagulation instructions were given to patient.  Results were reviewed/authorized by [name], PharmD, BCPS, CPP.  He was notified by [name] PharmD, BCPS, CPP.         Prior Anticoagulation Instructions: INR 2.1  Continue current dosing schedule.  Take 1/2 tablet on Wednesday and take 1 tablet all other days.  Return to clinic in 4 weeks.   Current Anticoagulation Instructions: INR 2.2  Take 1 tab daily except 0.5 tab Wednesday.  Recheck 4 weeks.

## 2010-11-15 NOTE — Medication Information (Signed)
Summary: rov  js  Anticoagulant Therapy  Managed by: Weston Brass, PharmD Referring MD: Shirlee Limerick PCP: Augusto Garbe, MD Supervising MD: Tenny Craw MD, Gunnar Fusi Indication 1: Atrial Fibrillation Lab Used: [facility] Site: [facility] INR POC 2.1 INR RANGE 2 - 3  Dietary changes: no    Health status changes: no    Bleeding/hemorrhagic complications: no    Recent/future hospitalizations: no    Any changes in medication regimen? no    Recent/future dental: no  Any missed doses?: no       Is patient compliant with meds? yes       Allergies: No Known Drug Allergies  Anticoagulation Management History:      Positive risk factors for bleeding include an age of 71 years or older.  The bleeding index is 'intermediate risk'.  Positive CHADS2 values include History of CHF and History of HTN.  Negative CHADS2 values include Age > 71 years old.  The start date was 07/24/2007.  His last INR was 1.9 ratio.  Anticoagulation responsible provider: Tenny Craw MD, Gunnar Fusi.  INR POC: 2.1.  Cuvette Lot#: [id number].  Exp: 06/2011.    Anticoagulation Management Assessment/Plan:      The patient's current anticoagulation dose is Coumadin 6 mg tabs: Take as directed by coumadin clinic..  The target INR is 2 - 3.  The next INR is due 05/17/2010.  Anticoagulation instructions were given to patient.  Results were reviewed/authorized by Weston Brass, PharmD.  He was notified by Weston Brass PharmD.         Prior Anticoagulation Instructions: The patient is to continue with the same dose of coumadin.  This dosage includes: one tablet every day except a half a tablet on Wednesdays.  Current Anticoagulation Instructions: INR 2.1  Continue same dose of 0.5 tab on Wednesday and 1 tab all other days.

## 2010-11-15 NOTE — Procedures (Signed)
Summary: Cardiology Device Clinic      Allergies Added: NKDA  Current Medications (verified): 1)  Digoxin 0.125 Mg Tabs (Digoxin) .... Take 1  Tablet By Mouth Daily 2)  Carvedilol 25 Mg Tabs (Carvedilol) .... Take 2 Tablet By Mouth Twice A Day 3)  Aspirin 81 Mg Tbec (Aspirin) .... Take One Tablet By Mouth Daily 4)  Simvastatin 20 Mg Tabs (Simvastatin) .... Take One Tablet By Mouth Daily At Bedtime 5)  Furosemide 20 Mg Tabs (Furosemide) .... One Daily 6)  Allopurinol 300 Mg Tabs (Allopurinol) .... Take 1 Tab Every Day 7)  Glipizide Xl 5 Mg Tb24 (Glipizide) .... 2  Tablet By Mouth Two Times A Day 8)  Benazepril Hcl 40 Mg  Tabs (Benazepril Hcl) .Marland Kitchen.. 1 Tablet By Mouth Daily 9)  Terazosin Hcl 2 Mg  Caps (Terazosin Hcl) .Marland Kitchen.. 1 By Mouth At Bedtime 10)  Coumadin 6 Mg Tabs (Warfarin Sodium) .... Take As Directed By Coumadin Clinic. 11)  Viagra 100 Mg Tabs (Sildenafil Citrate) .... As Needed 12)  Bayer Breeze 2 Test  Disk (Glucose Blood) .... Test Sugar Three Times A Day  Allergies (verified): No Known Drug Allergies  PPM Specifications Following MD:  Lewayne Bunting, MD     PPM Vendor:  St Jude     PPM Model Number:  [phone]     PPM Serial Number:  [id number] PPM DOI:  11/22/2009     PPM Implanting MD:  Lewayne Bunting, MD  Lead 1    Location: RA     DOI: 06/10/2005     Model #: 8413     Serial #: [id number]     Status: active Lead 2    Location: RV     DOI: 11/22/2009     Model #: [id number]     Serial #: [id number]     Status: active Lead 3    Location: LV     DOI: 06/10/2005     Model #: 3875     Serial #: [id number]     Status: active  Magnet Response Rate:  BOL 100 ERI  85  Indications:  CM   PPM Follow Up Battery Voltage:  2.96 V     Battery Est. Longevity:  6.6-6.9 yrs     Pacer Dependent:  No       PPM Device Measurements Atrium  Amplitude: 1.6 mV, Impedance: 580 ohms,  Right Ventricle  Amplitude: 12.0 mV, Impedance: 530 ohms, Threshold: 0.75 V at 0.5 msec Left Ventricle  Impedance: 730  ohms, Threshold: 1.25 V at 0.5 msec  Episodes MS Episodes:  1     Percent Mode Switch:  100%     Coumadin:  Yes Ventricular High Rate:  0     Atrial Pacing:  2.8%     Ventricular Pacing:  98%  Parameters Mode:  DDIR     Lower Rate Limit:  70     Upper Rate Limit:  120 Paced AV Delay:  170     Next Cardiology Appt Due:  11/14/2010 Tech Comments:  PT IN AF SINCE 02-27-10. + COUMADIN.  NORMAL DEVICE FUNCTION.  NO CHANGES MADE. ROV IN FEB 2012 W/GT. Vella Kohler  July 19, 2010 3:04 PM  ICD Specifications Following MD:  Lewayne Bunting, MD     ICD Vendor:  Medtronic     ICD Model Number:  7304     ICD Serial Number:  [id number] ICD DOI:  06/10/2005      Lead 1:  Location: RA     DOI: 06/10/2005     Model #: 1610     Serial #: [id number]     Status: active Lead 2:    Location: RV     DOI: 06/10/2005     Model #: 1914     Serial #: [id number]     Status: active Lead 3:    Location: LV     DOI: 06/10/2005     Model #: 4194     Serial #: [id number]     Status: active  Indications::  NICM  Explantation Comments: 11/22/2009 Medtronic [id number] h explanted  Episodes Coumadin:  Yes  Brady Parameters Mode DDIR     Lower Rate Limit:  70     PAV 170

## 2010-11-15 NOTE — Letter (Signed)
Summary: Handout Printed  Printed Handout:  - Coumadin Instructions-w/out Meds 

## 2010-11-15 NOTE — Assessment & Plan Note (Signed)
Summary: pc2/jml      Allergies Added: NKDA  Visit Type:  Follow-up Primary Provider:  Augusto Garbe, MD   History of Present Illness: [name] returns today for followup.  He is a pleasant 70 yo man with a h/o DCM, CHF, HTN, and obesity.  The patient initially had an EF of 20-25% but after BiV ICD implant and uptitration of meds, has an EF of 40-50%.  His CHF remains class 1-2. With improvement in his LV function, he had a BiV PM placed several months ago.  He denies c/p.  He has minimal peripheral edema. No PND or orthopnea.  Current Medications (verified): 1)  Digoxin 0.125 Mg Tabs (Digoxin) .... Take 1  Tablet By Mouth Daily 2)  Carvedilol 25 Mg Tabs (Carvedilol) .... Take 2 Tablet By Mouth Twice A Day 3)  Aspirin 81 Mg Tbec (Aspirin) .... Take One Tablet By Mouth Daily 4)  Simvastatin 20 Mg Tabs (Simvastatin) .... Take One Tablet By Mouth Daily At Bedtime 5)  Furosemide 80 Mg Tabs (Furosemide) .... Take One Tablet By Mouth Two Times A Day 6)  Allopurinol 300 Mg Tabs (Allopurinol) .... Take 1 Tab Every Day 7)  Glipizide Xl 5 Mg Tb24 (Glipizide) .... 2  Tablet By Mouth Two Times A Day 8)  Benazepril Hcl 40 Mg  Tabs (Benazepril Hcl) .Marland Kitchen.. 1 Tablet By Mouth Daily 9)  Terazosin Hcl 2 Mg  Caps (Terazosin Hcl) .Marland Kitchen.. 1 By Mouth At Bedtime 10)  Coumadin 6 Mg Tabs (Warfarin Sodium) .... Take As Directed By Coumadin Clinic. 11)  Viagra 100 Mg Tabs (Sildenafil Citrate) .... As Needed  Allergies (verified): No Known Drug Allergies  Past History:  Past Medical History: Last updated: 09/21/2009  1. Congestive heart failure secondary to nonischemic cardiomyopathy       a. EF currently 50% by ECHO 11/08 (previous 30-35%)       b. status post Medtronict biventricular ICD placement.(with Sprint Fidelis lead)       c. CPX with peak VO2 9.5  2. Morbid obesity.  3. Hypertension.  4. Hyperlipidemia.  5. Diabetes.  6. 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  [facility]  7. History of wandering atrial pacemaker.  8. Chronic atrial fibrillation  Review of Systems  The patient denies chest pain, syncope, dyspnea on exertion, and peripheral edema.    Vital Signs:  Patient profile:   70 year old male Height:      75 inches Weight:      272 pounds BMI:     34.12 Pulse rate:   71 / minute BP sitting:   122 / 80  (left arm)  Vitals Entered By: Laurance Flatten CMA (Feb 27, 2010 9:30 AM) Comments Pt thinks that he may have something in his eye.  Pt also wants his throat looked at.   Physical Exam  General:  Gen: well appearing. no resp difficulty HEENT: normal Neck: supple. no JVD. Carotids 2+ bilat; not bruits. No lymphadenopathy or thryomegaly appreciated. Cor: PMI nonpalpabe. Regular rate & rhythm. No rubs, gallops, murmur. Lungs: clear. Well healed PPM incision. Abdomen: soft, nontender, nondistended. No hepatosplenomegaly. No bruits or masses. Good bowel sounds. Extremities: no cyanosis, clubbing, rash, edema Neuro: alert & orientedx3, cranial nerves grossly intact. moves all 4 extremities w/o difficulty. affect pleasant    PPM Specifications Following MD:  Lewayne Bunting, MD  PPM Vendor:  St Jude     PPM Model Number:  [id number]     PPM Serial Number:  [id number] PPM DOI:  11/22/2009     PPM Implanting MD:  Lewayne Bunting, MD  Lead 1    Location: RA     DOI: 06/10/2005     Model #: 5784     Serial #: [id number]     Status: active Lead 2    Location: RV     DOI: 11/22/2009     Model #: [id number]     Serial #: [id number]     Status: active Lead 3    Location: LV     DOI: 06/10/2005     Model #: 4034     Serial #: [id number]     Status: active  Magnet Response Rate:  BOL 100 ERI  85  Indications:  CM   PPM Follow Up Pacer Dependent:  No      Episodes Coumadin:  Yes  Parameters Mode:  DDIR     Lower Rate Limit:  70     Upper Rate Limit:  120 Paced AV Delay:  170     MD Comments:  Agree  with above. 98% V. Paced.  ICD Specifications Following MD:  Lewayne Bunting, MD     ICD Vendor:  Medtronic     ICD Model Number:  7304     ICD Serial Number:  [id number] H ICD DOI:  06/10/2005      Lead 1:    Location: RA     DOI: 06/10/2005     Model #: 5076     Serial #: [id number]     Status: active Lead 2:    Location: RV     DOI: 06/10/2005     Model #: 0160     Serial #: [id number]     Status: active Lead 3:    Location: LV     DOI: 06/10/2005     Model #: 4194     Serial #: [id number]     Status: active  Indications::  NICM  Explantation Comments: 11/22/2009 Medtronic [id number] h explanted  ICD Follow Up Battery Voltage:  2.98 V     Battery Est. Longevity:  7.4-7.18yrs Underlying rhythm:  AFIB   ICD Device Measurements Atrium:  Amplitude: 3.1 mV, Impedance: 490 ohms,  Right Ventricle:  Amplitude: 12.0 mV, Impedance: 460 ohms, Threshold: 0.75 V at 0.5 msec Left Ventricle:  Impedance: 660 ohms, Threshold: 1.125 V at 0.5 msec  Episodes MS Episodes:  0     Percent Mode Switch:  0     Coumadin:  Yes Shock:  0     ATP:  0     Nonsustained:  0     Atrial Therapies:  0 Atrial Pacing:  2.8%     Ventricular Pacing:  98%  Brady Parameters Mode DDIR     Lower Rate Limit:  70     PAV 170     Next Cardiology Appt Due:  11/14/2010 Tech Comments:  NORMAL DEVICE FUNCTION.  NO CHANGES MADE.   PT IN AFIB. +COUMADIN.  NEXT CHECK IN FEB 2012. Vella Kohler  Feb 27, 2010 9:51 AM  Impression & Recommendations:  Problem # 1:  SYSTOLIC HEART FAILURE, CHRONIC (ICD-428.22) His CHF symptoms remain class 1-2. He is non-compliant with his diet and I have encouraged him to maintain a low sodium diet.  Will recheck in several months. His updated medication list for this problem includes:  Digoxin 0.125 Mg Tabs (Digoxin) .Marland Kitchen... Take 1  tablet by mouth daily    Carvedilol 25 Mg Tabs (Carvedilol) .Marland Kitchen... Take 2 tablet by mouth twice a day    Aspirin 81 Mg Tbec (Aspirin) .Marland Kitchen... Take one tablet by mouth  daily    Furosemide 80 Mg Tabs (Furosemide) .Marland Kitchen... Take one tablet by mouth two times a day    Benazepril Hcl 40 Mg Tabs (Benazepril hcl) .Marland Kitchen... 1 tablet by mouth daily    Coumadin 6 Mg Tabs (Warfarin sodium) .Marland Kitchen... Take as directed by coumadin clinic.  Problem # 2:  CARDIAC PACEMAKER IN SITU (ICD-V45.01) His device is working normally.  Will recheck in several months.  Problem # 3:  ATRIAL FIBRILLATION (ICD-427.31) At this point he will remain in atrial fib.  His rate is well controlled.  Continue current meds. His updated medication list for this problem includes:    Digoxin 0.125 Mg Tabs (Digoxin) .Marland Kitchen... Take 1  tablet by mouth daily    Carvedilol 25 Mg Tabs (Carvedilol) .Marland Kitchen... Take 2 tablet by mouth twice a day    Aspirin 81 Mg Tbec (Aspirin) .Marland Kitchen... Take one tablet by mouth daily    Coumadin 6 Mg Tabs (Warfarin sodium) .Marland Kitchen... Take as directed by coumadin clinic.  Patient Instructions: 1)  Your physician recommends that you schedule a follow-up appointment in: 12 months with Dr Ladona Ridgel

## 2010-11-15 NOTE — Procedures (Signed)
Summary: wound check      Allergies Added: NKDA  Current Medications (verified): 1)  Digoxin 0.125 Mg Tabs (Digoxin) .... Take 1  Tablet By Mouth Daily 2)  Carvedilol 25 Mg Tabs (Carvedilol) .... Take 2 Tablet By Mouth Twice A Day 3)  Aspirin 81 Mg Tbec (Aspirin) .... Take One Tablet By Mouth Daily 4)  Simvastatin 20 Mg Tabs (Simvastatin) .... Take One Tablet By Mouth Daily At Bedtime 5)  Furosemide 80 Mg Tabs (Furosemide) .... Take One Tablet By Mouth Two Times A Day 6)  Allopurinol 300 Mg Tabs (Allopurinol) .... Take 1 Tab Every Day 7)  Glipizide Xl 5 Mg Tb24 (Glipizide) .... 2  Tablet By Mouth Two Times A Day 8)  Benazepril Hcl 40 Mg  Tabs (Benazepril Hcl) .Marland Kitchen.. 1 Tablet By Mouth Daily 9)  Terazosin Hcl 2 Mg  Caps (Terazosin Hcl) .Marland Kitchen.. 1 By Mouth At Bedtime 10)  Coumadin 6 Mg Tabs (Warfarin Sodium) .... Take As Directed By Coumadin Clinic. 11)  Viagra 100 Mg Tabs (Sildenafil Citrate) .... As Needed  Allergies (verified): No Known Drug Allergies  PPM Specifications Following MD:  Lewayne Bunting, MD     PPM Vendor:  St Jude     PPM Model Number:  [phone]     PPM Serial Number:  [id number] PPM DOI:  11/22/2009     PPM Implanting MD:  Lewayne Bunting, MD  Lead 1    Location: RA     DOI: 06/10/2005     Model #: 9811     Serial #: [id number]     Status: active Lead 2    Location: RV     DOI: 11/22/2009     Model #: [id number]     Serial #: [id number]     Status: active Lead 3    Location: LV     DOI: 06/10/2005     Model #: 2841     Serial #: [id number]     Status: active  Magnet Response Rate:  BOL 100 ERI  85  Indications:  CM   PPM Follow Up Remote Check?  No Battery Voltage:  3.13 V     Battery Est. Longevity:  8.1 years     Pacer Dependent:  No       PPM Device Measurements Atrium  Amplitude: 0.6 mV, Impedance: 450 ohms,  Right Ventricle  Amplitude: 12 mV, Impedance: 580 ohms, Threshold: 0.625 V at 0.5 msec Left Ventricle  Impedance: 660 ohms, Threshold: 1.0 V at 0.5  msec  Episodes Percent Mode Switch:  100%     Coumadin:  Yes Atrial Pacing:  2.6%     Ventricular Pacing:  98%  Parameters Mode:  DDIR     Lower Rate Limit:  70     Upper Rate Limit:  120 Paced AV Delay:  170     Next Cardiology Appt Due:  02/11/2010 Tech Comments:  Steri strips removed, no redness, some edema noted.   A-fib , + coumadin.  No parameter changes.  Activity restrictions reviewed with the patient and he is in agreement.  ROV 3 months Dr. Ladona Ridgel. Altha Harm, LPN  December 06, 2009 11:56 AM   ICD Specifications Following MD:  Lewayne Bunting, MD     ICD Vendor:  Medtronic     ICD Model Number:  7304     ICD Serial Number:  [id number] ICD DOI:  06/10/2005      Lead 1:    Location: RA  DOI: 06/10/2005     Model #: 0454     Serial #: [id number]     Status: active Lead 2:    Location: RV     DOI: 06/10/2005     Model #: 2956     Serial #: [id number]     Status: active Lead 3:    Location: LV     DOI: 06/10/2005     Model #: 4194     Serial #: [id number]     Status: active  Indications::  NICM  Explantation Comments: 11/22/2009 Medtronic [id number] h explanted  ICD Follow Up Remote Check?  No

## 2010-11-15 NOTE — Progress Notes (Signed)
Summary: rx request   Phone Note Refill Request   Refills Requested: Medication #1:  DIGOXIN 0.125 MG TABS Take 1  tablet by mouth daily  Medication #2:  SIMVASTATIN 20 MG TABS Take one tablet by mouth daily at bedtime pt states [facility] [address] says they still do not have these refills    Method Requested: Telephone to Pharmacy Initial call taken by: Glynda Jaeger,  June 20, 2010 9:55 AM  Follow-up for Phone Call        spoke with Autumn at [facility] -- she says they did not recieve electronic response, refilled digoxin and simva x 3  told them to send the test strips to his PCP. They do not have one on file so I told her that when I talked to the Pt I would tell him he needs to call them in regards to this. Hardin Negus, RMA  June 20, 2010 1:27 PM   Pt says that we always give his test strips script, told him I would call Herbert Seta and ask her to call him back and let him know what we will do. Follow-up by: Hardin Negus, RMA,  June 20, 2010 1:35 PM  Additional Follow-up for Phone Call Additional follow up Details #1::        Will do breeze scripts Additional Follow-up by: Hardin Negus, RMA,  June 25, 2010 12:59 PM

## 2010-11-15 NOTE — Miscellaneous (Signed)
Summary: Sweaty in lobby, here  for labs, pt wants BS checked  On arrival here in lobby for pre-op lab work the patient complaining of feeling sweaty and wants his BS checked to make sure it isn't low. CBG checked was 203, and the patient had taken his insulin 3 hrs ago. The patient notified of his BS, and not having any other complaints. The triage nurse(Melanie) notified, and flag sent to Dr. Rosette Reveal. P. Kameisha Malicki,RN.

## 2010-11-15 NOTE — Medication Information (Signed)
Summary: [email or web address]  Anticoagulant Therapy  Managed by: Weston Brass, PharmD Referring MD: Shirlee Limerick PCP: Augusto Garbe, MD Supervising MD: Jens Som MD, Arlys John Indication 1: Atrial Fibrillation Lab Used: [facility] Site: [facility] INR POC 2.1 INR RANGE 2 - 3  Dietary changes: no    Health status changes: no    Bleeding/hemorrhagic complications: no    Recent/future hospitalizations: no    Any changes in medication regimen? no    Recent/future dental: no  Any missed doses?: no       Is patient compliant with meds? yes       Allergies: No Known Drug Allergies  Anticoagulation Management History:      The patient is taking warfarin and comes in today for a routine follow up visit.  Positive risk factors for bleeding include an age of 71 years or older.  The bleeding index is 'intermediate risk'.  Positive CHADS2 values include History of CHF and History of HTN.  Negative CHADS2 values include Age > 71 years old.  The start date was 07/24/2007.  His last INR was 1.9 ratio.  Anticoagulation responsible provider: Jens Som MD, Arlys John.  INR POC: 2.1.  Cuvette Lot#: [id number].  Exp: 02/2011.    Anticoagulation Management Assessment/Plan:      The patient's current anticoagulation dose is Coumadin 6 mg tabs: Take as directed by coumadin clinic..  The target INR is 2 - 3.  The next INR is due 02/22/2010.  Anticoagulation instructions were given to patient.  Results were reviewed/authorized by Weston Brass, PharmD.  He was notified by Weston Brass PharmD.         Prior Anticoagulation Instructions: INR 2.2  Take 1 tab daily except 0.5 tab Wednesday.  Recheck 4 weeks.    Current Anticoagulation Instructions: INR 2.1  Continue same dose of 1 tablet every day except 1/2 tablet on Wednesday

## 2010-11-15 NOTE — Cardiovascular Report (Signed)
Summary: Office Visit   Office Visit   Imported By: Roderic Ovens 07/26/2010 15:18:43  _____________________________________________________________________  External Attachment:    Type:   Image     Comment:   External Document

## 2010-11-15 NOTE — Assessment & Plan Note (Signed)
Summary: 1 month rov  with 2 D Echo 428.22      Allergies Added: NKDA  Primary Provider:  Augusto Garbe, MD   History of Present Illness: [name] returns today for followup.  He is a pleasant 70 yo man with a h/o DCM, CHF, HTN, atrial arrythmias (now chronic AF) and obesity.  The patient initially had an EF of 20-25% and single chamber ICD was implanted. EF then recovered to 50% and had ICD explanted and BiV pacer placed due to bradycardia.  We saw last month in clinic and was hypotenisve with SBP in 60s. Given IVFs and lasix and coreg cut in half.  Follow-up echo showed EF back down to 25-30%.   Says overall he is feelinng much better. Takes BP every day. SBP typically 120-130 but up as high as 170.  No significant edema. No PND or orthopnea. He remains very sedentary and uses wheelchair when he is up a long while. Hip some better.  No syncope. presyncope or palpitations.   Current Medications (verified): 1)  Digoxin 0.125 Mg Tabs (Digoxin) .... Take 1  Tablet By Mouth Daily 2)  Carvedilol 25 Mg Tabs (Carvedilol) .... One Twice A Day 3)  Aspirin 81 Mg Tbec (Aspirin) .... Take One Tablet By Mouth Daily 4)  Simvastatin 20 Mg Tabs (Simvastatin) .... Take One Tablet By Mouth Daily At Bedtime 5)  Furosemide 20 Mg Tabs (Furosemide) .... One Daily 6)  Allopurinol 300 Mg Tabs (Allopurinol) .... Take 1 Tab Every Day 7)  Glipizide Xl 5 Mg Tb24 (Glipizide) .... 2  Tablet By Mouth Two Times A Day 8)  Benazepril Hcl 40 Mg  Tabs (Benazepril Hcl) .Marland Kitchen.. 1 Tablet By Mouth Daily 9)  Terazosin Hcl 2 Mg  Caps (Terazosin Hcl) .Marland Kitchen.. 1 By Mouth At Bedtime 10)  Coumadin 6 Mg Tabs (Warfarin Sodium) .... Take As Directed By Coumadin Clinic. 11)  Viagra 100 Mg Tabs (Sildenafil Citrate) .... As Needed 12)  Bayer Breeze 2 Test  Disk (Glucose Blood) .... Test Sugar Three Times A Day 13)  Tramadol Hcl 50 Mg Tabs (Tramadol Hcl) .Marland Kitchen.. 1 By Mouth As Needed  Allergies (verified): No Known Drug Allergies  Past  History:  Past Medical History:  1. Congestive heart failure secondary to nonischemic cardiomyopathy       a. EF currently 50% by ECHO 11/08 (previous 30-35%)       b. status post Medtronict biventricular ICD placement.(with Sprint Fidelis lead)       c. CPX with peak VO2 9.5       d. ECHO 10/11 EF 25-30%  2. Morbid obesity.  3. Hypertension.  4. Hyperlipidemia.  5. Diabetes.  6. 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  [facility]  7. History of wandering atrial pacemaker.  8. Chronic atrial fibrillation  Review of Systems       As per HPI and past medical history; otherwise all systems negative.   Vital Signs:  Patient profile:   70 year old male Height:      75 inches Weight:      267 pounds BMI:     33.49 Pulse rate:   102 / minute Pulse rhythm:   regular Resp:     18 per minute BP sitting:   132 / 92  (right arm)  Vitals Entered By: Marrion Coy, CNA (August 16, 2010 9:44  AM)  Physical Exam  General:  well appearing. no resp difficulty. skin damp HEENT: normal Neck: supple. no JVD. Carotids 2+ bilat; not bruits. No lymphadenopathy or thryomegaly appreciated. Cor: PMI nonpalpabe. Regular rate & rhythm. No rubs, gallops, murmur. Lungs: clear. Abdomen: soft, nontender, nondistended. No hepatosplenomegaly. No bruits or masses. Good bowel sounds. Extremities: no cyanosis, clubbing, rash, edema Neuro: alert & orientedx3, cranial nerves grossly intact. moves all 4 extremities w/o difficulty. affect pleasant    PPM Specifications Following MD:  Lewayne Bunting, MD     PPM Vendor:  St Jude     PPM Model Number:  [id number]     PPM Serial Number:  [id number] PPM DOI:  11/22/2009     PPM Implanting MD:  Lewayne Bunting, MD  Lead 1    Location: RA     DOI: 06/10/2005     Model #: 5621     Serial #: [id number]     Status: active Lead 2    Location: RV     DOI: 11/22/2009     Model #: 6295MW      Serial #: [id number]     Status: active Lead 3    Location: LV     DOI: 06/10/2005     Model #: 4194     Serial #: [id number]     Status: active  Magnet Response Rate:  BOL 100 ERI  85  Indications:  CM   PPM Follow Up Pacer Dependent:  No      Episodes Coumadin:  Yes  Parameters Mode:  DDIR     Lower Rate Limit:  70     Upper Rate Limit:  120 Paced AV Delay:  170      ICD Specifications Following MD:  Lewayne Bunting, MD     ICD Vendor:  Medtronic     ICD Model Number:  7304     ICD Serial Number:  [id number] H ICD DOI:  06/10/2005      Lead 1:    Location: RA     DOI: 06/10/2005     Model #: 6387     Serial #: [id number]     Status: active Lead 2:    Location: RV     DOI: 06/10/2005     Model #: 8416     Serial #: [id number]     Status: active Lead 3:    Location: LV     DOI: 06/10/2005     Model #: 4194     Serial #: [id number]     Status: active  Indications::  NICM  Explantation Comments: 11/22/2009 Medtronic [id number] h explanted  Episodes Coumadin:  Yes  Brady Parameters Mode DDIR     Lower Rate Limit:  70     PAV 170     Impression & Recommendations:  Problem # 1:  SYSTOLIC HEART FAILURE, CHRONIC (ICD-428.22) EF back down for unclear reasons. I am wondering if it may be due to underlying AF but this seems unlikely as his ventricular rates have been well controlled but temporally the two seem correlated. Currently NYHA class III. Volume status ok. Remains on very good meds. Will repeat echo in 3 months and if EF remains depressed will need to consider upgrading to BiV ICD. Previous CPX showed Vo2 9.5 but overall fairly poor candidate for advanced therapies but if fx status drops dramatically may have to consider LVAD.  Problem # 2:  HYPERTENSION, BENIGN (ICD-401.1) BP labile. Will continue to follow. May need ambulatory BP  montior at some point.   Problem # 3:  ATRIAL FIBRILLATION (ICD-427.31) Chronic. Continue coumadin.   Patient Instructions: 1)  Your physician  recommends that you schedule a follow-up appointment in: 3 months 2)  Your physician recommends that you continue on your current medications as directed. Please refer to the Current Medication list given to you today.

## 2010-11-15 NOTE — Medication Information (Signed)
Summary: [name]      Allergies Added: NKDA Anticoagulant Therapy  Managed by: Samantha Crimes, PharmD Referring MD: Shirlee Limerick PCP: Augusto Garbe, MD Supervising MD: Myrtis Ser MD, Tinnie Gens Indication 1: Atrial Fibrillation Lab Used: [facility] Site: [address] INR POC 2.5 INR RANGE 2 - 3  Dietary changes: no    Health status changes: no    Bleeding/hemorrhagic complications: no    Recent/future hospitalizations: no    Any changes in medication regimen? no    Recent/future dental: no  Any missed doses?: no       Is patient compliant with meds? yes      Comments: possible hip surgery before next visit, will check with [place] after appointment in a week or so  Current Medications (verified): 1)  Digoxin 0.125 Mg Tabs (Digoxin) .... Take 1  Tablet By Mouth Daily 2)  Carvedilol 25 Mg Tabs (Carvedilol) .... One Twice A Day 3)  Aspirin 81 Mg Tbec (Aspirin) .... Take One Tablet By Mouth Daily 4)  Simvastatin 20 Mg Tabs (Simvastatin) .... Take One Tablet By Mouth Daily At Bedtime 5)  Furosemide 20 Mg Tabs (Furosemide) .... One Daily 6)  Allopurinol 300 Mg Tabs (Allopurinol) .... Take 1 Tab Every Day 7)  Glipizide Xl 5 Mg Tb24 (Glipizide) .... 2  Tablet By Mouth Two Times A Day 8)  Benazepril Hcl 40 Mg  Tabs (Benazepril Hcl) .Marland Kitchen.. 1 Tablet By Mouth Daily 9)  Terazosin Hcl 2 Mg  Caps (Terazosin Hcl) .Marland Kitchen.. 1 By Mouth At Bedtime 10)  Coumadin 6 Mg Tabs (Warfarin Sodium) .... Take As Directed By Coumadin Clinic. 11)  Viagra 100 Mg Tabs (Sildenafil Citrate) .... As Needed 12)  Bayer Breeze 2 Test  Disk (Glucose Blood) .... Test Sugar Three Times A Day 13)  Tramadol Hcl 50 Mg Tabs (Tramadol Hcl) .Marland Kitchen.. 1 By Mouth As Needed  Allergies (verified): No Known Drug Allergies  Anticoagulation Management History:      Positive risk factors for bleeding include an age of 72 years or older.  The bleeding index is 'intermediate risk'.  Positive CHADS2 values include History of CHF and History of HTN.   Negative CHADS2 values include Age > 4 years old.  The start date was 07/24/2007.  His last INR was 1.9 ratio.  Anticoagulation responsible provider: Myrtis Ser MD, Tinnie Gens.  INR POC: 2.5.  Exp: 09/2011.    Anticoagulation Management Assessment/Plan:      The patient's current anticoagulation dose is Coumadin 6 mg tabs: Take as directed by coumadin clinic..  The target INR is 2 - 3.  The next INR is due 11/06/2010.  Anticoagulation instructions were given to patient.  Results were reviewed/authorized by Samantha Crimes, PharmD.         Prior Anticoagulation Instructions: INR 2 Continue taking a half tablet on wednesday. And 1 tablet all other days. Recheck in 4 weeks.  Current Anticoagulation Instructions: Cont with current regimen Return to clinic on Jan 24th, at 845 am

## 2010-11-15 NOTE — Cardiovascular Report (Signed)
Summary: [facility]    Cardiology   Imported By: Roderic Ovens 03/16/2010 15:44:10  _____________________________________________________________________  External Attachment:    Type:   Image     Comment:   External Document

## 2010-11-15 NOTE — Cardiovascular Report (Signed)
Summary: Pre Op Orders  Pre Op Orders   Imported By: Roderic Ovens 11/08/2009 10:34:57  _____________________________________________________________________  External Attachment:    Type:   Image     Comment:   External Document

## 2010-11-15 NOTE — Cardiovascular Report (Signed)
Summary: Office Visit   Office Visit   Imported By: Roderic Ovens 12/22/2009 12:10:07  _____________________________________________________________________  External Attachment:    Type:   Image     Comment:   External Document

## 2010-11-15 NOTE — Cardiovascular Report (Signed)
Summary: Office Visit   Office Visit   Imported By: Roderic Ovens 11/08/2009 14:02:11  _____________________________________________________________________  External Attachment:    Type:   Image     Comment:   External Document

## 2010-11-21 ENCOUNTER — Encounter (INDEPENDENT_AMBULATORY_CARE_PROVIDER_SITE_OTHER): Payer: Medicare Other

## 2010-11-21 ENCOUNTER — Encounter: Payer: Self-pay | Admitting: Internal Medicine

## 2010-11-21 DIAGNOSIS — I428 Other cardiomyopathies: Secondary | ICD-10-CM

## 2010-12-04 ENCOUNTER — Encounter: Payer: Self-pay | Admitting: Cardiology

## 2010-12-04 ENCOUNTER — Encounter (INDEPENDENT_AMBULATORY_CARE_PROVIDER_SITE_OTHER): Payer: Medicare Other

## 2010-12-04 DIAGNOSIS — I4891 Unspecified atrial fibrillation: Secondary | ICD-10-CM

## 2010-12-04 DIAGNOSIS — Z7901 Long term (current) use of anticoagulants: Secondary | ICD-10-CM

## 2010-12-04 LAB — CONVERTED CEMR LAB: POC INR: 2.1

## 2010-12-05 ENCOUNTER — Encounter: Payer: Self-pay | Admitting: Internal Medicine

## 2010-12-05 ENCOUNTER — Ambulatory Visit (INDEPENDENT_AMBULATORY_CARE_PROVIDER_SITE_OTHER): Payer: Medicare Other | Admitting: Internal Medicine

## 2010-12-05 ENCOUNTER — Other Ambulatory Visit: Payer: Self-pay | Admitting: Internal Medicine

## 2010-12-05 DIAGNOSIS — I4891 Unspecified atrial fibrillation: Secondary | ICD-10-CM

## 2010-12-05 DIAGNOSIS — R0602 Shortness of breath: Secondary | ICD-10-CM

## 2010-12-05 DIAGNOSIS — I5022 Chronic systolic (congestive) heart failure: Secondary | ICD-10-CM

## 2010-12-05 LAB — CBC WITH DIFFERENTIAL/PLATELET
Basophils Absolute: 0.1 10*3/uL (ref 0.0–0.1)
Hemoglobin: 12.8 g/dL — ABNORMAL LOW (ref 13.0–17.0)
Lymphocytes Relative: 25.2 % (ref 12.0–46.0)
Monocytes Relative: 11 % (ref 3.0–12.0)
Neutro Abs: 4.6 10*3/uL (ref 1.4–7.7)
Neutrophils Relative %: 61.5 % (ref 43.0–77.0)
RDW: 16.9 % — ABNORMAL HIGH (ref 11.5–14.6)

## 2010-12-05 LAB — BASIC METABOLIC PANEL
BUN: 12 mg/dL (ref 6–23)
Calcium: 8.6 mg/dL (ref 8.4–10.5)
GFR: 77.6 mL/min (ref >60.00)
Potassium: 3.6 mEq/L (ref 3.5–5.1)
Sodium: 142 mEq/L (ref 135–145)

## 2010-12-05 LAB — BRAIN NATRIURETIC PEPTIDE: Pro B Natriuretic peptide (BNP): 634.1 pg/mL — ABNORMAL HIGH (ref 0.0–100.0)

## 2010-12-05 NOTE — Procedures (Signed)
Summary: pacer ck moved from [facility] schedule /mt      Allergies Added: NKDA  Current Medications (verified): 1)  Digoxin 0.125 Mg Tabs (Digoxin) .... Take 1  Tablet By Mouth Daily 2)  Carvedilol 25 Mg Tabs (Carvedilol) .... One Twice A Day 3)  Aspirin 81 Mg Tbec (Aspirin) .... Take One Tablet By Mouth Daily 4)  Simvastatin 20 Mg Tabs (Simvastatin) .... Take One Tablet By Mouth Daily At Bedtime 5)  Furosemide 20 Mg Tabs (Furosemide) .... One Daily 6)  Allopurinol 300 Mg Tabs (Allopurinol) .... Take 1 Tab Every Day 7)  Glipizide Xl 5 Mg Tb24 (Glipizide) .... 2  Tablet By Mouth Two Times A Day 8)  Benazepril Hcl 40 Mg  Tabs (Benazepril Hcl) .Marland Kitchen.. 1 Tablet By Mouth Daily 9)  Terazosin Hcl 2 Mg  Caps (Terazosin Hcl) .Marland Kitchen.. 1 By Mouth At Bedtime 10)  Coumadin 6 Mg Tabs (Warfarin Sodium) .... Take As Directed By [facility] Clinic. 11)  Viagra 100 Mg Tabs (Sildenafil Citrate) .... As Needed 12)  Bayer Breeze 2 Test  Disk (Glucose Blood) .... Test Sugar Three Times A Day 13)  Tramadol Hcl 50 Mg Tabs (Tramadol Hcl) .Marland Kitchen.. 1 By Mouth As Needed  Allergies (verified): No Known Drug Allergies  PPM Specifications Following MD:  Lewayne Bunting, MD     PPM Vendor:  St Jude     PPM Model Number:  [id number]     PPM Serial Number:  [id number] PPM DOI:  11/22/2009     PPM Implanting MD:  Lewayne Bunting, MD  Lead 1    Location: RA     DOI: 06/10/2005     Model #: [id number]     Serial #: [id number]     Status: active Lead 2    Location: RV     DOI: 11/22/2009     Model #: [id number]     Serial #: [id number]     Status: active Lead 3    Location: LV     DOI: 06/10/2005     Model #: 2841     Serial #: [id number]     Status: active  Magnet Response Rate:  BOL 100 ERI  85  Indications:  CM   PPM Follow Up Remote Check?  No Battery Voltage:  2.95 V     Battery Est. Longevity:  5.6 years     Pacer Dependent:  No       PPM Device Measurements Atrium  Amplitude: 1.0 mV, Impedance: 460 ohms,  Right Ventricle  Amplitude: 12 mV,  Impedance: 450 ohms, Threshold: 0.75 V at 0.5 msec Left Ventricle  Impedance: 600 ohms, Threshold: 1.125 V at 0.5 msec  Episodes Percent Mode Switch:  100%     Coumadin:  Yes Atrial Pacing:  2.7%     Ventricular Pacing:  98%  Parameters Mode:  DDIR     Lower Rate Limit:  70     Upper Rate Limit:  120 Paced AV Delay:  170     Next Cardiology Appt Due:  02/12/2011 Tech Comments:  No parameter changes. Device function normal.  A-fib 100%, + coumadin.  ROV 3 months with Dr. Ladona Ridgel. Altha Harm, LPN  November 21, 2010 3:54 PM   ICD Specifications Following MD:  Lewayne Bunting, MD     ICD Vendor:  Medtronic     ICD Model Number:  7304     ICD Serial Number:  [id number] ICD DOI:  06/10/2005      Lead 1:  Location: RA     DOI: 06/10/2005     Model #: 9629     Serial #: [id number]     Status: active Lead 2:    Location: RV     DOI: 06/10/2005     Model #: 1027     Serial #: [id number]     Status: active Lead 3:    Location: LV     DOI: 06/10/2005     Model #: 4194     Serial #: [id number]     Status: active  Indications::  NICM  Explantation Comments: 11/22/2009 Medtronic [id number] h explanted  Episodes Coumadin:  Yes  Brady Parameters Mode DDIR     Lower Rate Limit:  70     PAV 170

## 2010-12-06 ENCOUNTER — Ambulatory Visit (HOSPITAL_COMMUNITY): Payer: Medicare Other | Attending: Internal Medicine

## 2010-12-06 DIAGNOSIS — I4891 Unspecified atrial fibrillation: Secondary | ICD-10-CM | POA: Insufficient documentation

## 2010-12-06 DIAGNOSIS — E669 Obesity, unspecified: Secondary | ICD-10-CM | POA: Insufficient documentation

## 2010-12-06 DIAGNOSIS — I509 Heart failure, unspecified: Secondary | ICD-10-CM | POA: Insufficient documentation

## 2010-12-06 DIAGNOSIS — I10 Essential (primary) hypertension: Secondary | ICD-10-CM | POA: Insufficient documentation

## 2010-12-06 DIAGNOSIS — I059 Rheumatic mitral valve disease, unspecified: Secondary | ICD-10-CM | POA: Insufficient documentation

## 2010-12-06 DIAGNOSIS — I079 Rheumatic tricuspid valve disease, unspecified: Secondary | ICD-10-CM | POA: Insufficient documentation

## 2010-12-07 ENCOUNTER — Telehealth: Payer: Self-pay | Admitting: Internal Medicine

## 2010-12-11 NOTE — Medication Information (Signed)
Summary: rov/tm   Anticoagulant Therapy  Managed by: Weston Brass, PharmD Referring MD: Shirlee Limerick PCP: Augusto Garbe, MD Supervising MD: Patty Sermons Indication 1: Atrial Fibrillation Lab Used: [facility] Site: [facility] INR POC 2.1 INR RANGE 2 - 3  Dietary changes: no    Health status changes: no    Bleeding/hemorrhagic complications: no    Recent/future hospitalizations: no    Any changes in medication regimen? yes       Details: recent changes to diabetes medications but patient is not sure of exact changes off the top of his head  Recent/future dental: no  Any missed doses?: no       Is patient compliant with meds? yes       Allergies: No Known Drug Allergies  Anticoagulation Management History:      The patient is taking warfarin and comes in today for a routine follow up visit.  Positive risk factors for bleeding include an age of 71 years or older.  The bleeding index is 'intermediate risk'.  Positive CHADS2 values include History of CHF and History of HTN.  Negative CHADS2 values include Age > 13 years old.  The start date was 07/24/2007.  His last INR was 2.1.  Anticoagulation responsible provider: Trula Frede.  INR POC: 2.1.  Cuvette Lot#: [id number].  Exp: 10/2011.    Anticoagulation Management Assessment/Plan:      The patient's current anticoagulation dose is Coumadin 6 mg tabs: Take as directed by coumadin clinic..  The target INR is 2 - 3.  The next INR is due 01/01/2011.  Anticoagulation instructions were given to patient.  Results were reviewed/authorized by Weston Brass, PharmD.  He was notified by Margot Chimes PharmD Candidate.         Prior Anticoagulation Instructions: INR 2.1 (goal 2-3)  Continue current regimen. Return to clinic in 4 weeks.  Current Anticoagulation Instructions: INR 2.1   Continue to take 1 tablet everyday except on Wednesdays when you only take 1/2 tablet.  Recheck INR in 4 weeks.

## 2010-12-11 NOTE — Progress Notes (Addendum)
Summary: question re meds   Phone Note Call from Patient Call back at Home Phone [phone]   Caller: Patient Reason for Call: Talk to Nurse Summary of Call: pt has question re new meds. Initial call taken by: Roe Coombs,  December 07, 2010 3:46 PM  Follow-up for Phone Call        Phone Call Completed PT WANTING TO KNOW IF HE CAN TAKE OMEGA XL  WILL DISCUSS WITH DR [name] PT AWARE. Follow-up by: Scherrie Bateman, LPN,  December 07, 2010 4:07 PM     Appended Document: question re meds ok. especially if he likes to spend money.   Appended Document: question re meds pt aware

## 2010-12-12 ENCOUNTER — Encounter: Payer: Self-pay | Admitting: Internal Medicine

## 2010-12-12 ENCOUNTER — Telehealth: Payer: Self-pay | Admitting: Internal Medicine

## 2010-12-17 ENCOUNTER — Telehealth: Payer: Self-pay | Admitting: Internal Medicine

## 2010-12-18 ENCOUNTER — Telehealth: Payer: Self-pay | Admitting: Internal Medicine

## 2010-12-20 ENCOUNTER — Other Ambulatory Visit (INDEPENDENT_AMBULATORY_CARE_PROVIDER_SITE_OTHER): Payer: Medicare Other

## 2010-12-20 ENCOUNTER — Other Ambulatory Visit: Payer: Self-pay | Admitting: Internal Medicine

## 2010-12-20 ENCOUNTER — Encounter: Payer: Self-pay | Admitting: Internal Medicine

## 2010-12-20 DIAGNOSIS — I5022 Chronic systolic (congestive) heart failure: Secondary | ICD-10-CM

## 2010-12-20 DIAGNOSIS — Z01810 Encounter for preprocedural cardiovascular examination: Secondary | ICD-10-CM

## 2010-12-20 LAB — CBC WITH DIFFERENTIAL/PLATELET
Basophils Absolute: 0.1 10*3/uL (ref 0.0–0.1)
Basophils Relative: 0.6 % (ref 0.0–3.0)
Eosinophils Absolute: 0.1 10*3/uL (ref 0.0–0.7)
Eosinophils Relative: 0.9 % (ref 0.0–5.0)
HCT: 39.5 % (ref 39.0–52.0)
Hemoglobin: 12.6 g/dL — ABNORMAL LOW (ref 13.0–17.0)
Lymphocytes Relative: 19.2 % (ref 12.0–46.0)
Lymphs Abs: 1.5 10*3/uL (ref 0.7–4.0)
MCHC: 31.8 g/dL (ref 30.0–36.0)
MCV: 111.6 fl — ABNORMAL HIGH (ref 78.0–100.0)
Monocytes Absolute: 0.9 10*3/uL (ref 0.1–1.0)
Monocytes Relative: 11.3 % (ref 3.0–12.0)
Neutro Abs: 5.4 10*3/uL (ref 1.4–7.7)
Neutrophils Relative %: 68 % (ref 43.0–77.0)
Platelets: 370 10*3/uL (ref 150.0–400.0)
RBC: 3.54 Mil/uL — ABNORMAL LOW (ref 4.22–5.81)
RDW: 16.5 % — ABNORMAL HIGH (ref 11.5–14.6)
WBC: 8 10*3/uL (ref 4.5–10.5)

## 2010-12-20 LAB — PROTIME-INR: INR: 1.5 ratio — ABNORMAL HIGH (ref 0.8–1.0)

## 2010-12-20 LAB — BASIC METABOLIC PANEL
Chloride: 104 mEq/L (ref 96–112)
Potassium: 4.3 mEq/L (ref 3.5–5.1)
Sodium: 141 mEq/L (ref 135–145)

## 2010-12-20 NOTE — Cardiovascular Report (Signed)
Summary: Office Visit   Office Visit   Imported By: Roderic Ovens 12/10/2010 15:54:16  _____________________________________________________________________  External Attachment:    Type:   Image     Comment:   External Document

## 2010-12-20 NOTE — Assessment & Plan Note (Signed)
Summary: per checkout/sf  Medications Added GLIPIZIDE 10 MG TABS (GLIPIZIDE) take two times a day NOVOLIN N 100 UNIT/ML SUSP (INSULIN ISOPHANE HUMAN) take as directed      Allergies Added: NKDA  Visit Type:  Follow-up Primary Provider:  Augusto Garbe, MD  CC:  sob.  History of Present Illness: [name] returns today for followup.  He is a pleasant 70 yo man with a h/o DCM, CHF, HTN, atrial arrythmias (now chronic AF) and obesity.  The patient initially had an EF of 20-25% and single chamber ICD was implanted. EF then recovered to 50% and had ICD explanted and BiV pacer placed due to bradycardia.  Had routine follow-up echo in 08/09/10 and showed EF back down to 25-30%. At last visit was feeling OK. We planned to repeat echo at this visit.   Has gained 24 pounds as his blood sugar is running low and having to eat frequently. Now more SOB.  No CP. Still with severe hip arthritis pain. He gets SOB if lies on left side. No CP. SBP typically 120-130. No significant edema. He remains very sedentary and uses wheelchair when he is up a long while - hip is limiting factor.  No syncope, presyncope or palpitations. On ICD is in AF 100% of time with good rate control.    Current Medications (verified): 1)  Digoxin 0.125 Mg Tabs (Digoxin) .... Take 1  Tablet By Mouth Daily 2)  Carvedilol 25 Mg Tabs (Carvedilol) .... One Twice A Day 3)  Aspirin 81 Mg Tbec (Aspirin) .... Take One Tablet By Mouth Daily 4)  Simvastatin 20 Mg Tabs (Simvastatin) .... Take One Tablet By Mouth Daily At Bedtime 5)  Furosemide 20 Mg Tabs (Furosemide) .... One Daily 6)  Allopurinol 300 Mg Tabs (Allopurinol) .... Take 1 Tab Every Day 7)  Glipizide 10 Mg Tabs (Glipizide) .... Take Two Times A Day 8)  Benazepril Hcl 40 Mg  Tabs (Benazepril Hcl) .Marland Kitchen.. 1 Tablet By Mouth Daily 9)  Terazosin Hcl 2 Mg  Caps (Terazosin Hcl) .Marland Kitchen.. 1 By Mouth At Bedtime 10)  Coumadin 6 Mg Tabs (Warfarin Sodium) .... Take As Directed By Coumadin Clinic. 11)   Viagra 100 Mg Tabs (Sildenafil Citrate) .... As Needed 12)  Bayer Breeze 2 Test  Disk (Glucose Blood) .... Test Sugar Three Times A Day 13)  Tramadol Hcl 50 Mg Tabs (Tramadol Hcl) .Marland Kitchen.. 1 By Mouth As Needed 14)  Novolin N 100 Unit/ml Susp (Insulin Isophane Human) .... Take As Directed  Allergies (verified): No Known Drug Allergies  Past History:  Past Medical History:  1. Congestive heart failure secondary to nonischemic cardiomyopathy       a. EF currently 50% by ECHO 11/08 (previous 30-35%)       b. status post Medtronic biventricular ICD placement.(with Sprint Fidelis lead) -explanted             --now with St. Jude Anthem BivPM       c. CPX with peak VO2 9.5       d. ECHO 10/11 EF 25-30%       e. reported cardiac cath in late 90s without significant CAD  2. Morbid obesity.  3. Hypertension.  4. Hyperlipidemia.  5. Diabetes.  6. 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  [facility]  7. History of wandering atrial pacemaker.  8. Chronic atrial fibrillation  Review of  Systems       As per HPI and past medical history; otherwise all systems negative.   Vital Signs:  Patient profile:   70 year old male Height:      75 inches Weight:      291 pounds Pulse rate:   64 / minute Pulse rhythm:   regular BP sitting:   128 / 80  (right arm)  Vitals Entered By: Jacquelin Hawking, CMA (December 05, 2010 9:07 AM)  Physical Exam  General:  well appearing. no resp difficulty.  HEENT: normal Neck: supple. no obvious JVD (hard to exam given neck size). Carotids 2+ bilat; not bruits. No lymphadenopathy or thryomegaly appreciated. Cor: PMI nonpalpabe. Regular rate & rhythm. No rubs, gallops, murmur. Lungs: clear. Abdomen: soft, nontender, nondistended. No hepatosplenomegaly. No bruits or masses. Good bowel sounds. Extremities: no cyanosis, clubbing, rash, edema Neuro: alert & orientedx3, cranial  nerves grossly intact. moves all 4 extremities w/o difficulty. affect pleasant    PPM Specifications Following MD:  Chad Bunting, MD     PPM Vendor:  St Jude     PPM Model Number:  [id number]     PPM Serial Number:  [id number] PPM DOI:  11/22/2009     PPM Implanting MD:  Chad Bunting, MD  Lead 1    Location: RA     DOI: 06/10/2005     Model #: 8413     Serial #: [id number]     Status: active Lead 2    Location: RV     DOI: 11/22/2009     Model #: [id number]     Serial #: [id number]     Status: active Lead 3    Location: LV     DOI: 06/10/2005     Model #: 4194     Serial #: [id number]     Status: active  Magnet Response Rate:  BOL 100 ERI  85  Indications:  CM   PPM Follow Up Pacer Dependent:  No      Episodes Coumadin:  Yes  Parameters Mode:  DDIR     Lower Rate Limit:  70     Upper Rate Limit:  120 Paced AV Delay:  170      ICD Specifications Following MD:  Chad Bunting, MD     ICD Vendor:  Medtronic     ICD Model Number:  7304     ICD Serial Number:  [id number] ICD DOI:  06/10/2005      Lead 1:    Location: RA     DOI: 06/10/2005     Model #: 0630     Serial #: [id number]     Status: active Lead 2:    Location: RV     DOI: 06/10/2005     Model #: 5732     Serial #: [id number]     Status: active Lead 3:    Location: LV     DOI: 06/10/2005     Model #: 4194     Serial #: [id number]     Status: active  Indications::  NICM  Explantation Comments: 11/22/2009 Medtronic [id number] h explanted  Episodes Coumadin:  Yes  Brady Parameters Mode DDIR     Lower Rate Limit:  70     PAV 170     Impression & Recommendations:  Problem # 1:  SYSTOLIC HEART FAILURE, CHRONIC (ICD-428.22) I suspect his increased dyspnea is more related to weight gain. No obvious fluid overload. Functional capacity limited mostly by hip pain.  Will recheck echo. If EF remains depressd may need to consider R and LHC (vs nuke study) to further evaluate. Emphasized need for weight loss.  Check BMET and BNP.  Problem #  2:  ATRIAL FIBRILLATION (ICD-427.31) Chronic. Continue coumadin.   Other Orders: EKG w/ Interpretation (93000) TLB-BMP (Basic Metabolic Panel-BMET) (80048-METABOL) TLB-BNP (B-Natriuretic Peptide) (83880-BNPR) TLB-CBC Platelet - w/Differential (85025-CBCD) Echocardiogram (Echo)  Patient Instructions: 1)  Labs today 2)  Your physician has requested that you have an echocardiogram.  Echocardiography is a painless test that uses sound waves to create images of your heart. It provides your doctor with information about the size and shape of your heart and how well your heart's chambers and valves are working.  This procedure takes approximately one hour. There are no restrictions for this procedure. 3)  Follow up in 1 month

## 2010-12-20 NOTE — Progress Notes (Signed)
Summary: calling regarding cath   Phone Note Call from Patient Call back at Home Phone [phone]   Caller: Patient Summary of Call: Pt calling regarding a cath Initial call taken by: Judie Grieve,  December 12, 2010 1:22 PM  Follow-up for Phone Call        spoke w/pt cath sch 3/9 at 1:30 instuctions reviewed w/pt and mailed to him.  Will discuss w/Dr Bensimhon on Monday how long he wants pt to be off coumadin and call him back then, will arrange labs either Wed or Thur next week Meredith Staggers, RN  December 12, 2010 4:03 PM

## 2010-12-20 NOTE — Letter (Signed)
Summary: Cardiac Catheterization Instructions- [facility]  [facility], Main Office  [address]   [place], [place] [address]   Phone: [phone]  Fax: [phone]     12/12/2010 MRN: [id number]  [facility] [address] [place], [place]  [address]  [place]  Dear Mr. Giarrusso,   You are scheduled for a Cardiac Catheterization on Friday 12/21/10 with Dr. [name]  Please arrive to the 1st floor of the [facility] and V[facility] Center at [facility] at 12:30 pm on the day of your procedure. Please do not arrive before 6:30 a.m. Call the Heart and Vascular Center at [phone] if you are unable to make your appointmnet. The Code to get into the parking garage under the building is 3000. Take the elevators to the 1st floor. You must have someone to drive you home. Someone must be with you for the first 24 hours after you arrive home. Please wear clothes that are easy to get on and off and wear slip-on shoes. Do not eat or drink after midnight except water with your medications that morning. Bring all your medications and current insurance cards with you.  _X__ DO NOT take these medications before your procedure: _______Glipizide and Novolin Insulin    I will call you on Monday regarding when to stop your coumadin _________________________________________________________  ___ Make sure you take your aspirin.  ___ You may take ALL of your medications with water that morning. ________________________________________________________________________________________________________________________________  ___ DO NOT take ANY medications before your procedure.  ___ Pre-med instructions:  ________________________________________________________________________________________________________________________________  The usual length of stay after your procedure is 2 to 3 hours. This can vary.  If you have any questions, please call the office at the number listed  above.   Meredith Staggers, RN  Appended Document: Cardiac Catheterization Instructions- [facility] instructinos reviewed w/pt via phone and copy mailed

## 2010-12-21 ENCOUNTER — Encounter: Payer: Self-pay | Admitting: Internal Medicine

## 2010-12-21 ENCOUNTER — Inpatient Hospital Stay (HOSPITAL_COMMUNITY)
Admission: AD | Admit: 2010-12-21 | Discharge: 2010-12-24 | DRG: 287 | Disposition: A | Discharge status: Home / Self Care | Payer: Medicare Other | Source: Ambulatory Visit | Attending: Internal Medicine | Admitting: Internal Medicine

## 2010-12-21 ENCOUNTER — Inpatient Hospital Stay (HOSPITAL_BASED_OUTPATIENT_CLINIC_OR_DEPARTMENT_OTHER)
Admission: RE | Admit: 2010-12-21 | Discharge: 2010-12-21 | Disposition: A | Discharge status: Hospice | Payer: Medicare Other | Source: Ambulatory Visit | Attending: Internal Medicine | Admitting: Internal Medicine

## 2010-12-21 ENCOUNTER — Inpatient Hospital Stay (HOSPITAL_COMMUNITY): Payer: Medicare Other

## 2010-12-21 DIAGNOSIS — Z95810 Presence of automatic (implantable) cardiac defibrillator: Secondary | ICD-10-CM

## 2010-12-21 DIAGNOSIS — E876 Hypokalemia: Secondary | ICD-10-CM | POA: Diagnosis present

## 2010-12-21 DIAGNOSIS — Z01810 Encounter for preprocedural cardiovascular examination: Secondary | ICD-10-CM

## 2010-12-21 DIAGNOSIS — Z01812 Encounter for preprocedural laboratory examination: Secondary | ICD-10-CM

## 2010-12-21 DIAGNOSIS — I4891 Unspecified atrial fibrillation: Secondary | ICD-10-CM | POA: Diagnosis present

## 2010-12-21 DIAGNOSIS — I2510 Atherosclerotic heart disease of native coronary artery without angina pectoris: Secondary | ICD-10-CM | POA: Insufficient documentation

## 2010-12-21 DIAGNOSIS — I5023 Acute on chronic systolic (congestive) heart failure: Principal | ICD-10-CM | POA: Diagnosis present

## 2010-12-21 DIAGNOSIS — N183 Chronic kidney disease, stage 3 unspecified: Secondary | ICD-10-CM | POA: Diagnosis present

## 2010-12-21 DIAGNOSIS — I428 Other cardiomyopathies: Secondary | ICD-10-CM | POA: Diagnosis present

## 2010-12-21 DIAGNOSIS — I509 Heart failure, unspecified: Secondary | ICD-10-CM | POA: Diagnosis present

## 2010-12-21 DIAGNOSIS — I129 Hypertensive chronic kidney disease with stage 1 through stage 4 chronic kidney disease, or unspecified chronic kidney disease: Secondary | ICD-10-CM | POA: Diagnosis present

## 2010-12-21 DIAGNOSIS — I2789 Other specified pulmonary heart diseases: Secondary | ICD-10-CM | POA: Insufficient documentation

## 2010-12-21 DIAGNOSIS — E119 Type 2 diabetes mellitus without complications: Secondary | ICD-10-CM | POA: Insufficient documentation

## 2010-12-21 DIAGNOSIS — Z01818 Encounter for other preprocedural examination: Secondary | ICD-10-CM

## 2010-12-21 DIAGNOSIS — Z7901 Long term (current) use of anticoagulants: Secondary | ICD-10-CM

## 2010-12-21 DIAGNOSIS — I10 Essential (primary) hypertension: Secondary | ICD-10-CM | POA: Insufficient documentation

## 2010-12-21 DIAGNOSIS — E785 Hyperlipidemia, unspecified: Secondary | ICD-10-CM | POA: Diagnosis present

## 2010-12-21 LAB — POCT I-STAT 3, ART BLOOD GAS (G3+)
TCO2: 26 mmol/L (ref 0–100)
pCO2 arterial: 39.1 mmHg (ref 35.0–45.0)
pH, Arterial: 7.413 (ref 7.350–7.450)

## 2010-12-21 LAB — POCT I-STAT 3, VENOUS BLOOD GAS (G3P V)
Acid-Base Excess: 1 mmol/L (ref 0.0–2.0)
Acid-base deficit: 1 mmol/L (ref 0.0–2.0)
Bicarbonate: 24.5 mEq/L — ABNORMAL HIGH (ref 20.0–24.0)
O2 Saturation: 43 %
TCO2: 26 mmol/L (ref 0–100)
TCO2: 28 mmol/L (ref 0–100)
pCO2, Ven: 44.5 mmHg — ABNORMAL LOW (ref 45.0–50.0)
pO2, Ven: 27 mmHg — CL (ref 30.0–45.0)
pO2, Ven: 25 mmHg — CL (ref 30.0–45.0)

## 2010-12-21 LAB — COMPREHENSIVE METABOLIC PANEL
ALT: 20 U/L (ref 0–53)
AST: 16 U/L (ref 0–37)
Alkaline Phosphatase: 57 U/L (ref 39–117)
CO2: 26 mEq/L (ref 19–32)
Chloride: 105 mEq/L (ref 96–112)
GFR calc Af Amer: >60 mL/min (ref >60)
GFR calc non Af Amer: >60 mL/min (ref >60)
Potassium: 3.7 mEq/L (ref 3.5–5.1)
Sodium: 139 mEq/L (ref 135–145)
Total Bilirubin: 1.1 mg/dL (ref 0.3–1.2)

## 2010-12-21 LAB — CBC
MCH: 34.3 pg — ABNORMAL HIGH (ref 26.0–34.0)
MCV: 106.1 fL — ABNORMAL HIGH (ref 78.0–100.0)
Platelets: 392 10*3/uL (ref 150–400)
RDW: 14.6 % (ref 11.5–15.5)

## 2010-12-21 LAB — DIFFERENTIAL
Basophils Relative: 1 % (ref 0–1)
Eosinophils Absolute: 0.1 10*3/uL (ref 0.0–0.7)
Eosinophils Relative: 2 % (ref 0–5)
Lymphs Abs: 2 10*3/uL (ref 0.7–4.0)
Monocytes Relative: 14 % — ABNORMAL HIGH (ref 3–12)
Neutrophils Relative %: 53 % (ref 43–77)

## 2010-12-21 LAB — POCT I-STAT GLUCOSE: Glucose, Bld: 173 mg/dL — ABNORMAL HIGH (ref 70–99)

## 2010-12-22 DIAGNOSIS — I5023 Acute on chronic systolic (congestive) heart failure: Secondary | ICD-10-CM

## 2010-12-22 LAB — GLUCOSE, CAPILLARY
Glucose-Capillary: 106 mg/dL — ABNORMAL HIGH (ref 70–99)
Glucose-Capillary: 138 mg/dL — ABNORMAL HIGH (ref 70–99)
Glucose-Capillary: 57 mg/dL — ABNORMAL LOW (ref 70–99)

## 2010-12-22 LAB — BASIC METABOLIC PANEL
CO2: 27 mEq/L (ref 19–32)
Calcium: 8.4 mg/dL (ref 8.4–10.5)
Chloride: 106 mEq/L (ref 96–112)
GFR calc Af Amer: >60 mL/min (ref >60)
Sodium: 141 mEq/L (ref 135–145)

## 2010-12-22 LAB — PROTIME-INR: INR: 1.7 — ABNORMAL HIGH (ref 0.00–1.49)

## 2010-12-22 LAB — CBC
HCT: 37.1 % — ABNORMAL LOW (ref 39.0–52.0)
MCV: 105.7 fL — ABNORMAL HIGH (ref 78.0–100.0)
RBC: 3.51 MIL/uL — ABNORMAL LOW (ref 4.22–5.81)
WBC: 6.4 10*3/uL (ref 4.0–10.5)

## 2010-12-22 LAB — BRAIN NATRIURETIC PEPTIDE: Pro B Natriuretic peptide (BNP): 309 pg/mL — ABNORMAL HIGH (ref 0.0–100.0)

## 2010-12-23 LAB — CBC
Hemoglobin: 11.9 g/dL — ABNORMAL LOW (ref 13.0–17.0)
MCH: 34.5 pg — ABNORMAL HIGH (ref 26.0–34.0)
MCHC: 33 g/dL (ref 30.0–36.0)
Platelets: 387 10*3/uL (ref 150–400)
RDW: 14.4 % (ref 11.5–15.5)

## 2010-12-23 LAB — PROTIME-INR: INR: 1.75 — ABNORMAL HIGH (ref 0.00–1.49)

## 2010-12-23 LAB — GLUCOSE, CAPILLARY
Glucose-Capillary: 54 mg/dL — ABNORMAL LOW (ref 70–99)
Glucose-Capillary: 145 mg/dL — ABNORMAL HIGH (ref 70–99)
Glucose-Capillary: 114 mg/dL — ABNORMAL HIGH (ref 70–99)
Glucose-Capillary: 202 mg/dL — ABNORMAL HIGH (ref 70–99)

## 2010-12-23 LAB — BASIC METABOLIC PANEL
CO2: 30 mEq/L (ref 19–32)
Calcium: 8.2 mg/dL — ABNORMAL LOW (ref 8.4–10.5)
Creatinine, Ser: 1.59 mg/dL — ABNORMAL HIGH (ref 0.4–1.5)
GFR calc Af Amer: 52 mL/min — ABNORMAL LOW (ref >60)

## 2010-12-24 LAB — PROTIME-INR: INR: 2.04 — ABNORMAL HIGH (ref 0.00–1.49)

## 2010-12-24 LAB — CBC
HCT: 36.9 % — ABNORMAL LOW (ref 39.0–52.0)
Hemoglobin: 11.9 g/dL — ABNORMAL LOW (ref 13.0–17.0)
MCHC: 32.2 g/dL (ref 30.0–36.0)

## 2010-12-24 LAB — BASIC METABOLIC PANEL
BUN: 20 mg/dL (ref 6–23)
Chloride: 97 mEq/L (ref 96–112)
Potassium: 3.4 mEq/L — ABNORMAL LOW (ref 3.5–5.1)
Sodium: 138 mEq/L (ref 135–145)

## 2010-12-24 LAB — GLUCOSE, CAPILLARY: Glucose-Capillary: 146 mg/dL — ABNORMAL HIGH (ref 70–99)

## 2010-12-25 NOTE — Progress Notes (Signed)
Summary: has more question re meds  Medications Added HYDREA 500 MG CAPS (HYDROXYUREA) Take 1 capsule by mouth three times a day ACARBOSE 50 MG TABS (ACARBOSE) 1/2 tab three times a day       Phone Note Call from Patient   Caller: Patient Reason for Call: Talk to Nurse Summary of Call: pt has more question re meds. Initial call taken by: Roe Coombs,  December 18, 2010 9:01 AM  Follow-up for Phone Call        spoke w/pt he states he is on Hydroxyurea 500mg  three times a day and acarbose 50mg  1/2 tab three times a day and wanted to know about holding these meds before his cath.  [facility] said he can [facility] med so he will do so, advised to hold acarbose AM of cath, will add meds to med list and advised pt to bring all meds to next visit to ensure his med list accurate he is agreeable Meredith Staggers, RN  December 18, 2010 9:23 AM     New/Updated Medications: HYDREA 500 MG CAPS (HYDROXYUREA) Take 1 capsule by mouth three times a day ACARBOSE 50 MG TABS (ACARBOSE) 1/2 tab three times a day

## 2010-12-25 NOTE — Progress Notes (Signed)
Summary: pt calling re lab work   Phone Note Call from Patient   Caller: Patient [phone] Reason for Call: Talk to Nurse Summary of Call: pt thought he was to come in today for lab work,emr note states he was to be called today re coumadin and labs wed or thurs, but he said he was called back re coming in today Initial call taken by: Glynda Jaeger,  December 17, 2010 8:10 AM  Follow-up for Phone Call        I talked with pt--he is aware that Herbert Seta will call him back today about lab appt(I told him probably will be Wed or Thur according to note) and Coumadin instructions after reviewing with Dr [name] today

## 2010-12-25 NOTE — Progress Notes (Signed)
Summary: pt wants to talk to you/2nd call   Phone Note Call from Patient Call back at Home Phone [phone]   Caller: Patient Reason for Call: Talk to Nurse, Talk to Doctor Summary of Call: pt rtn call and wants you to call him back Initial call taken by: Omer Jack,  December 17, 2010 2:22 PM  Follow-up for Phone Call        per Dr [name] pt can take coumadin tonight and then hold, pt is aware he will come for labs on Thurs. AM Follow-up by: Meredith Staggers, RN,  December 17, 2010 6:27 PM

## 2010-12-26 ENCOUNTER — Encounter: Payer: Self-pay | Admitting: Internal Medicine

## 2010-12-26 ENCOUNTER — Other Ambulatory Visit (INDEPENDENT_AMBULATORY_CARE_PROVIDER_SITE_OTHER): Payer: Medicare Other

## 2010-12-26 ENCOUNTER — Other Ambulatory Visit: Payer: Self-pay | Admitting: Internal Medicine

## 2010-12-26 DIAGNOSIS — I5022 Chronic systolic (congestive) heart failure: Secondary | ICD-10-CM

## 2010-12-26 LAB — BASIC METABOLIC PANEL
CO2: 34 mEq/L — ABNORMAL HIGH (ref 19–32)
Chloride: 93 mEq/L — ABNORMAL LOW (ref 96–112)
Creatinine, Ser: 2 mg/dL — ABNORMAL HIGH (ref 0.4–1.5)
Potassium: 4.4 mEq/L (ref 3.5–5.1)
Sodium: 137 mEq/L (ref 135–145)

## 2010-12-27 ENCOUNTER — Encounter: Payer: Self-pay | Admitting: Physician Assistant

## 2010-12-27 ENCOUNTER — Ambulatory Visit (INDEPENDENT_AMBULATORY_CARE_PROVIDER_SITE_OTHER): Payer: Medicare Other | Admitting: Physician Assistant

## 2010-12-27 DIAGNOSIS — N179 Acute kidney failure, unspecified: Secondary | ICD-10-CM

## 2010-12-27 DIAGNOSIS — E860 Dehydration: Secondary | ICD-10-CM

## 2010-12-27 DIAGNOSIS — I5022 Chronic systolic (congestive) heart failure: Secondary | ICD-10-CM

## 2010-12-31 ENCOUNTER — Other Ambulatory Visit: Payer: Medicare Other

## 2011-01-01 NOTE — Assessment & Plan Note (Signed)
Summary: abnormal lab/per DOD/jt  Medications Added DIGOXIN 0.125 MG TABS (DIGOXIN) 1 tab two times a day FUROSEMIDE 80 MG TABS (FUROSEMIDE) HOLD FOR 2 DAYS....THEN RESTART AS 1/2 (HALF) TABLET two times a day FUROSEMIDE 80 MG TABS (FUROSEMIDE) 1 tab two times a day GLUCOTROL 5 MG TABS (GLIPIZIDE) 2 tabs two times a day BENAZEPRIL HCL 40 MG  TABS (BENAZEPRIL HCL) HOLD FOR 2 DAYS...THEN RESTART 1/2 (HALF) TAB once daily HYDRALAZINE HCL 25 MG TABS (HYDRALAZINE HCL) take 1 and 1/2 tab three times a day IMDUR 30 MG XR24H-TAB (ISOSORBIDE MONONITRATE) 1 tab once daily POTASSIUM CHLORIDE CRYS CR 20 MEQ CR-TABS (POTASSIUM CHLORIDE CRYS CR) HOLD FOR 2 DAYS THEN RESTART 1 TAB  once daily POTASSIUM CHLORIDE CRYS CR 20 MEQ CR-TABS (POTASSIUM CHLORIDE CRYS CR) 1 tab two times a day      Allergies Added: NKDA  Visit Type:  ov Primary Provider:  Augusto Garbe, MD  CC:  fatigue.......  History of Present Illness: Primary Cardiologist:  Dr. Arvilla Leon Primary Electrophysiologist:  Dr. Lewayne Leon  [name] is a 70 yo male with a h/o  DCM, CHF, HTN, atrial arrythmias (now chronic AF) and obesity.  The patient initially had an EF of 20-25% and single chamber ICD was implanted. EF then recovered to 50% and had ICD explanted and BiV pacer placed due to bradycardia.  Had routine follow-up echo in 08/09/10 and showed EF back down to 25-30%.  He was noted to be volume overloaded at his last visit with [name] and was set up for R + L heart cath.  A repeat echo on 12/06/10 demonstrated EF 20-25%; mod LVH; mild MR; mod LAE; mild-mod decreased RVF and PASP 34.  Cath on 12/21/10 demonstrated mod nonobs. CAD (mLAD 70%, m-dLAD 40%; oDx 60%; mCFX 30-40%; pRCA 30%) and depressed CO, mixed pulmo HTN and evidence of severe volume overload.  He was admitted for diuresis and CHF management.  He diuresed 6L and his d/c weight was 277 lbs.  His d/c creat. was 1.65.  He was brought back yesterday for repeat bmet (Na 137,  K 4.4, BUN 34, Creat 2).  Due to his elevated creat. he was brought back today to see me for evaluation.  Before I entered the room today, he had vomited times one.  He appeared to be weak.  His blood pressure was in the 70s systolically.  His orthostatic vital signs were not all that positive.  He denied any significant nausea prior to this.  He denies any mental status changes.  He denies any chest pain or shortness of breath.  He states he's lost about 3 pounds a day since coming home from the hospital.    Current Medications (verified): 1)  Digoxin 0.125 Mg Tabs (Digoxin) .Marland Kitchen.. 1 Tab Two Times A Day 2)  Carvedilol 25 Mg Tabs (Carvedilol) .... One Twice A Day 3)  Aspirin 81 Mg Tbec (Aspirin) .... Take One Tablet By Mouth Daily 4)  Simvastatin 20 Mg Tabs (Simvastatin) .... Take One Tablet By Mouth Daily At Bedtime 5)  Furosemide 80 Mg Tabs (Furosemide) .Marland Kitchen.. 1 Tab Two Times A Day 6)  Allopurinol 300 Mg Tabs (Allopurinol) .... Take 1 Tab Every Day 7)  Glucotrol 5 Mg Tabs (Glipizide) .... 2 Tabs Two Times A Day 8)  Benazepril Hcl 40 Mg  Tabs (Benazepril Hcl) .Marland Kitchen.. 1 Tablet By Mouth Daily 9)  Terazosin Hcl 2 Mg  Caps (Terazosin Hcl) .Marland Kitchen.. 1 By Mouth At Bedtime 10)  Coumadin  6 Mg Tabs (Warfarin Sodium) .... Take As Directed By [facility]. 11)  Viagra 100 Mg Tabs (Sildenafil Citrate) .... As Needed 12)  Bayer Breeze 2 Test  Disk (Glucose Blood) .... Test Sugar Three Times A Day 13)  Tramadol Hcl 50 Mg Tabs (Tramadol Hcl) .Marland Kitchen.. 1 By Mouth As Needed 14)  Novolin N 100 Unit/ml Susp (Insulin Isophane Human) .... Take As Directed 15)  Hydrea 500 Mg Caps (Hydroxyurea) .... Take 1 Capsule By Mouth Three Times A Day 16)  Hydralazine Hcl 25 Mg Tabs (Hydralazine Hcl) .... Take 1 and 1/2 Tab Three Times A Day 17)  Imdur 30 Mg Xr24h-Tab (Isosorbide Mononitrate) .Marland Kitchen.. 1 Tab Once Daily 18)  Potassium Chloride Crys Cr 20 Meq Cr-Tabs (Potassium Chloride Crys Cr) .Marland Kitchen.. 1 Tab Two Times A Day  Allergies  (verified): No Known Drug Allergies  Past History:  Past Medical History:  1. Congestive heart failure secondary to nonischemic cardiomyopathy       a. EF currently 50% by ECHO 11/08 (previous 30-35%)       b. status post Medtronic biventricular ICD placement.(with Sprint Fidelis lead) -explanted             --now with [employer]. Jude Anthem BivPM       c. CPX with peak VO2 9.5       d. ECHO 10/11 EF 25-30%. . . . Echo 2/12: EF 20-25%       e. reported cardiac cath in late 90s without significant CAD  2. Morbid obesity.  3. Hypertension.  4. Hyperlipidemia.  5. Diabetes.  6. 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  [facility]  7. History of wandering atrial pacemaker.  8. Chronic atrial fibrillation  9. Mod. Nonobs CAD       a.  cath 3/12:  mLAD 70%, m-dLAD 40%; oDx 60%; mCFX 30-40%; pRCA 30%  Review of Systems       As per  the HPI.  All other systems reviewed and negative.   Vital Signs:  Patient profile:   70 year old male Height:      75 inches Weight:      259 pounds BMI:     32.49 Pulse rate:   74 / minute Pulse (ortho):   100 / minute Pulse rhythm:   regular Resp:     18 per minute BP sitting:   72 / 54  (right arm) BP standing:   90 / 70 Cuff size:   large  Vitals Entered By: [name], CMA (December 27, 2010 11:16 AM)  Serial Vital Signs/Assessments:  Time      Position  BP       Pulse  Resp  Temp     By 11:49 AM  Lying LA  99/71    69                    [name] 11:50 AM  Sitting   93/57    48                    [name] 11:56 AM  Standing  90/70    100                   [name]   Physical Exam  General:  WN/WD male; mildly lethargic Head:  normocephalic and atraumatic  Eyes:  Sclerae are clear Mouth:  Lips are dry Neck:  No JVD Lungs:  Clear to auscultation; no wheezing or rales. Heart:  Normal S1, S2; Irreg Irreg; no murmur Abdomen:  Soft,  nontender, no organomegaly. Msk:  No deformity. Extremities:  No edema. Neurologic:  CNs 2-12 intact. He is alert to person and place; responds "2010, 2011" when asked date. . . Marland Kitchenthen corrects to "2012" Skin:  Dry Psych:  Normal affect.   EKG  Procedure date:  12/27/2010  Findings:      underlying atrial fibrillation Heart rate 74 Ventricular paced Occasional PVC  PPM Specifications Following MD:  Chad Bunting, MD     PPM Vendor:  St Jude     PPM Model Number:  [id number]     PPM Serial Number:  [id number] PPM DOI:  11/22/2009     PPM Implanting MD:  Chad Bunting, MD  Lead 1    Location: RA     DOI: 06/10/2005     Model #: [id number]     Serial #: [id number]     Status: active Lead 2    Location: RV     DOI: 11/22/2009     Model #: [id number]     Serial #: [id number]     Status: active Lead 3    Location: LV     DOI: 06/10/2005     Model #: [id number]     Serial #: [id number]     Status: active  Magnet Response Rate:  BOL 100 ERI  85  Indications:  CM   PPM Follow Up Pacer Dependent:  No      Episodes Coumadin:  Yes  Parameters Mode:  DDIR     Lower Rate Limit:  70     Upper Rate Limit:  120 Paced AV Delay:  170      ICD Specifications Following MD:  Chad Bunting, MD     ICD Vendor:  Medtronic     ICD Model Number:  7304     ICD Serial Number:  [id number] ICD DOI:  06/10/2005      Lead 1:    Location: RA     DOI: 06/10/2005     Model #: [id number]     Serial #: [id number]     Status: active Lead 2:    Location: RV     DOI: 06/10/2005     Model #: [id number]     Serial #: [id number]     Status: active Lead 3:    Location: LV     DOI: 06/10/2005     Model #: [id number]     Serial #: [id number]     Status: active  Indications::  NICM  Explantation Comments: 11/22/2009 Medtronic [id number] h explanted  Episodes Coumadin:  Yes  Brady Parameters Mode DDIR     Lower Rate Limit:  70     PAV 170     Impression & Recommendations:  Problem # 1:  DEHYDRATION (ICD-276.51) He is over diuresed.  He is  hypotensive in relation to this as well as developing increased creatinine.  I discussed his case further with Dr. Ladona Ridgel.  At this point we have decided to adjust his medications and bring them back in early followup.  He will hold his Lasix and his Benazepril and potassium for the next 48 hours.  He will restart his Lasix at 40 mg twice a day, Benazepril 20 mg a day and potassium 20 mEq once a day.  He will  have a follow up basic metabolic panel on Monday 3/19.  He's been asked to drink plenty of fluids over the weekend.  He's been advised to go to the emergency room should he begin to feel worse or continues to have problems with vomiting or nausea.  Problem # 2:  RENAL FAILURE, ACUTE (ICD-584.9) Secondary to #1.  Followup basic metabolic panel Monday, March 19.  Problem # 3:  SYSTOLIC HEART FAILURE, CHRONIC (ICD-428.22) As noted, he is over diuresed.  His weight is down 18 pounds by our scales since discharge from the hospital.  He endorses a 9 pound weight loss since being discharged from the hospital.  As noted above, we will adjust his medications and see him back in early followup.  He will see [name] next Thursday, March 22.  Problem # 4:  ATRIAL FIBRILLATION (ICD-427.31) Rate controlled.  Patient Instructions: 1)  Your physician recommends that you schedule a follow-up appointment in: PT WAS SUPPOSED TO SEE [name] 01/03/11 BUT APPT WAS CANCELLED BECAUSE PT CAME IN TODAY TO SEE PA..AS PER Laurissa Cowper, PA-C PT NEEDS TO BE PUT BACK ON [name] SCHEDULE FOR 01/03/11 IF THIS IS NOT POSSIBLE THEN PUT ON Skai Lickteig, PA-C SCHEDULE  01/03/11 SAME DAY AS DR. Leory Plowman IN OFFICE.... 2)  Your physician recommends that you return for lab work in: 12/31/10 BMET .Marland Kitchen..A HAND WRITTEN RX HAS BEEN GIVEN TO YOU TODAY FOR LAB WORK IF YOU [facility]. 3)  Your physician has recommended you make the following change in your medication: HOLD LASIX (FUROSEMIDE) FOR 2 DAYS...HOLD  BENAZEPRIL FOR 2 DAYS...HOLD POTASSIUM FOR 2 DAYS............... 4)  THEN RESTART FUROSEMIDE 80 MG 1/2 (HALF) TABLET two times a day......BENAZEPRIL 40 MG 1/2 (HALF) TABLET once daily....Marland KitchenPOTASSIUM 20 mEq 1 TABLET once daily...Marland KitchenMarland Kitchen

## 2011-01-02 LAB — GLUCOSE, CAPILLARY
Glucose-Capillary: 172 mg/dL — ABNORMAL HIGH (ref 70–99)
Glucose-Capillary: 205 mg/dL — ABNORMAL HIGH (ref 70–99)
Glucose-Capillary: 259 mg/dL — ABNORMAL HIGH (ref 70–99)

## 2011-01-02 LAB — PROTIME-INR: INR: 2.21 — ABNORMAL HIGH (ref 0.00–1.49)

## 2011-01-03 ENCOUNTER — Ambulatory Visit (INDEPENDENT_AMBULATORY_CARE_PROVIDER_SITE_OTHER): Payer: Medicare Other | Admitting: *Deleted

## 2011-01-03 ENCOUNTER — Ambulatory Visit: Payer: Medicare Other | Admitting: Internal Medicine

## 2011-01-03 ENCOUNTER — Ambulatory Visit (INDEPENDENT_AMBULATORY_CARE_PROVIDER_SITE_OTHER): Payer: Medicare Other | Admitting: Internal Medicine

## 2011-01-03 ENCOUNTER — Encounter: Payer: Self-pay | Admitting: Internal Medicine

## 2011-01-03 ENCOUNTER — Telehealth: Payer: Self-pay | Admitting: *Deleted

## 2011-01-03 VITALS — BP 132/80 | HR 70 | Wt 263.0 lb

## 2011-01-03 DIAGNOSIS — I2510 Atherosclerotic heart disease of native coronary artery without angina pectoris: Secondary | ICD-10-CM

## 2011-01-03 DIAGNOSIS — Z7901 Long term (current) use of anticoagulants: Secondary | ICD-10-CM

## 2011-01-03 DIAGNOSIS — I5022 Chronic systolic (congestive) heart failure: Secondary | ICD-10-CM

## 2011-01-03 DIAGNOSIS — I4891 Unspecified atrial fibrillation: Secondary | ICD-10-CM

## 2011-01-03 DIAGNOSIS — R0609 Other forms of dyspnea: Secondary | ICD-10-CM

## 2011-01-03 DIAGNOSIS — N529 Male erectile dysfunction, unspecified: Secondary | ICD-10-CM

## 2011-01-03 LAB — BASIC METABOLIC PANEL
CO2: 31 mEq/L (ref 19–32)
Glucose, Bld: 196 mg/dL — ABNORMAL HIGH (ref 70–99)
Potassium: 3.7 mEq/L (ref 3.5–5.1)
Sodium: 136 mEq/L (ref 135–145)

## 2011-01-03 LAB — POCT INR: INR: 2.1

## 2011-01-03 NOTE — Patient Instructions (Signed)
INR 2.1  Continue same dose of 1 tablet every day except 1/2 tablet on Wednesday.  Recheck INR in 4 weeks.

## 2011-01-03 NOTE — Telephone Encounter (Signed)
Called and spoke with pt to verify hydralazine and imdur dosages. meds and dosages verified Lisabeth Devoid, RN

## 2011-01-03 NOTE — Progress Notes (Signed)
HPI:   [name] is a 70 yo male with a h/o  DCM, CHF, HTN, atrial arrythmias (now chronic AF) and obesity.  He initially had an EF of 20-25% and single chamber ICD was implanted. EF then recovered to 50% (echo 2008) and had ICD explanted and BiV pacer placed due to bradycardia.  Had routine follow-up echo in 08/09/10 and showed EF back down to 25-30%. Medcial therapy intensified but f/u echo in 12/06/10 showed worsening EF of 20-25%. So patient underwent R&L cath on 12/21/10  Cath results: 1) Mod nonobs CAD (mLAD 70%, m-dLAD 40%; oDx 60%; mCFX 30-40%; pRCA 30%)  2) RA 21  RV 81/16/24  PA 81/36 (56) PCWP 33 (v to 43) PA sat 43% 48%, Fick 4.2/1.6  After cath admitted for diuresis and titration of hydralazine/nitrates. Apparently d/c'd on hydralazine 37.5 tid and Imdur 30. Lost about 6L of fluid and d/c weight 277. Lasix increased from 20 qd to 80 bid. Saw [name] in f/u last week and was hypontensive with increased Cr due to overdiuresis. Weight down to 257. Lasix and ACE-I held for 2 days. Lasix restarted at 40 bid.  Returns for f/u. Doing MUCH better. Weight now stable at 260-263. Breathing much better. No CP, orthopnea, PND, LE edema. BP stable 120-130. Had repeat labs recently with PCP.   ROS: All systems negative except as listed in HPI, PMH and Problem List.  Past Medical History  Diagnosis Date  . Obesities, morbid   . Hypertension   . Hyperlipidemia   . Diabetes mellitus   . Polycythemia     vera requiring phlebotomy and treatment with hydroxyurea on a chronic bais. [facility].  . CHF (congestive heart failure)     secondary to nonischemic cardiomyopathy. EF initially 20-25% up to 50% in 2008. Echo 2/12 EF 20-25% . RHC 3/12: RA 21  RV 81/16/24  PA 81/36 (56) PCWP 33 (v to 43) PA sat 43% 48%.status post Medtronic biventricular ICD placement.(with Sprint Fidelis leads)-explanted--now with [employer] Anthem BivPM   . 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 Atrial fibrillation     Chronic. On coumadin.  . Coronary artery disease, non-occlusive     Cath 3/12: mLAD 70%, m-dLAD 40%; Dx 60%; mCFX 30-40%; pRCA 30%)    Current Outpatient Prescriptions  Medication Sig Dispense Refill  . acarbose (PRECOSE) 50 MG tablet Take 50 mg by mouth. Take 1/2 tablet 3 times daily       . allopurinol (ZYLOPRIM) 300 MG tablet Take 300 mg by mouth daily.        Marland Kitchen aspirin 81 MG tablet Take 81 mg by mouth daily.        . benazepril (LOTENSIN) 20 MG tablet Take 10 mg by mouth daily.       . carvedilol (COREG) 25 MG tablet Take 25 mg by mouth 2 (two) times daily.        . digoxin (LANOXIN) 0.125 MG tablet Take 125 mcg by mouth daily.        . furosemide (LASIX) 20 MG tablet Take 40 mg by mouth 2 (two) times daily.       Marland Kitchen glipiZIDE (GLUCOTROL) 10 MG tablet Take 10 mg by mouth 2 (two) times daily.        . Glucose Blood (BAYER BREEZE 2 TEST) DISK by In Vitro route. Test sugar 3 times a day       . hydroxyurea (  HYDREA) 500 MG capsule Take 500 mg by mouth 3 (three) times daily. May take with food to minimize GI side effects.       . insulin NPH (HUMULIN N,NOVOLIN N) 100 UNIT/ML injection Inject into the skin. Use as directed       . sildenafil (VIAGRA) 100 MG tablet Take 100 mg by mouth as needed.        . simvastatin (ZOCOR) 20 MG tablet Take 20 mg by mouth at bedtime.        Marland Kitchen terazosin (HYTRIN) 2 MG capsule Take 2 mg by mouth at bedtime.        . traMADol (ULTRAM) 50 MG tablet Take 50 mg by mouth as needed.        . warfarin (COUMADIN) 6 MG tablet Take by mouth as directed.           PHYSICAL EXAM: Filed Vitals:   01/03/11 1137  BP: 132/80  Pulse: 70  General:  Well appearing. No resp difficulty HEENT: normal Neck: supple. JVP flat. Carotids 2+ bilaterally; no bruits. No lymphadenopathy or thryomegaly appreciated. Cor: PMI laterally displaced. Regular rate & rhythm. No rubs, gallops or murmurs. Lungs: clear Abdomen: soft,  nontender, nondistended. No hepatosplenomegaly. No bruits or masses. Good bowel sounds. Extremities: no cyanosis, clubbing, rash, edema Neuro: alert & orientedx3, cranial nerves grossly intact. Moves all 4 extremities w/o difficulty. Affect pleasant.    ECG: Atrial fib with V-pacing 70 bpm   ASSESSMENT & PLAN:

## 2011-01-03 NOTE — Patient Instructions (Addendum)
Your physician recommends that you schedule a follow-up appointment in: 1 month with Arvilla Meres, MD Your physician recommends that you return for lab work in: today.

## 2011-01-04 LAB — DIGOXIN LEVEL: Digoxin Level: 0.6 ng/mL — ABNORMAL LOW (ref 0.8–2.0)

## 2011-01-07 ENCOUNTER — Encounter: Payer: Self-pay | Admitting: Internal Medicine

## 2011-01-07 ENCOUNTER — Other Ambulatory Visit: Payer: Self-pay | Admitting: Internal Medicine

## 2011-01-07 DIAGNOSIS — I2510 Atherosclerotic heart disease of native coronary artery without angina pectoris: Secondary | ICD-10-CM | POA: Insufficient documentation

## 2011-01-07 DIAGNOSIS — N529 Male erectile dysfunction, unspecified: Secondary | ICD-10-CM | POA: Insufficient documentation

## 2011-01-07 NOTE — Assessment & Plan Note (Signed)
Much improved after diuresis. However still NYHA III. Trick will be to get right dose of diuretic and then bring back and titrate his other HF meds as tolerated. Continue lasix 40 bid. Discussed sliding scale lasix. Check labs again today.

## 2011-01-07 NOTE — Assessment & Plan Note (Signed)
Chronic. Continue coumadin.  

## 2011-01-07 NOTE — Assessment & Plan Note (Signed)
Stable. Nonobstructive. Continue RF modification.

## 2011-01-07 NOTE — Assessment & Plan Note (Signed)
Stop sildenafil in setting of Imdur use.

## 2011-01-10 NOTE — Procedures (Signed)
[name], [name]               ACCOUNT NO.:  [id number]  MEDICAL RECORD NO.:  [id number]           PATIENT TYPE:  I  LOCATION:  2928                         FACILITY:  [facility]  PHYSICIAN:  [name], MDDATE OF BIRTH:  21-Apr-1941  DATE OF PROCEDURE:  12/21/2010 DATE OF DISCHARGE:                           CARDIAC CATHETERIZATION   PRIMARY CARE PHYSICIAN:  [name], [place]  PATIENT IDENTIFICATION:  [name] is a 70 year old male with a history of nonischemic cardiomyopathy as well as diabetes who I recently saw in clinic.  He previously had an ejection fraction in the 20% range; however, this recovered to 50%, but then most recent echo showed his EF was back down to the 25% range.  We thus brought him in for right and left heart catheterization.  PROCEDURES PERFORMED: 1. Right heart catheterization. 2. Selective coronary angiography. 3. Left heart catheterization. 4. Left ventriculogram.  DESCRIPTION OF PROCEDURE:  The risks and indications were explained. Consent was signed and placed on the chart.  A 4-French arterial sheath was initially placed in the right femoral artery using a modified Seldinger technique; however, we were not able to opacify his coronary arteries well enough due to their size so we then upgraded to a 5-French arterial sheath.  Standard catheters including JL-4, JR-4, and angled pigtail were used.  All catheter exchanges were made over wire.  A 7- French venous sheath was also placed in the right femoral vein using modified Seldinger technique.  Standard Swan-Ganz catheter was used. There were no apparent complications.  FINDINGS:  Right atrial pressure mean of 21, RV pressure 81/16 with an EDP of 24, PA pressure 81/36 with a mean of 56.  Pulmonary capillary wedge pressure mean of 33 with a V-wave to 43.  Central aortic pressure 171/89 with a mean of 119.  LV pressure 164/28 with EDP of 34.  There was no aortic stenosis on pullback.  Fick  cardiac output 4.2.  Cardiac index 1.6.  Pulmonary vascular resistance was 5.4 Woods units.  SVR was 1892.  Aortic saturation was 90% on room air.  Pulmonary artery saturation was 43% and 48%.  Left main was large, angiographically normal.  LAD was a large vessel coursing to the apex.  It gave off a proximal large diagonal.  In the mid LAD, there was a long 70% stenosis.  This was smooth and thick walled.  In the mid-to-distal LAD, there was diffuse disease with a tandem 40% lesions.  In the ostium of the first diagonal, there appeared to be 60% lesion.  There was moderate disease throughout the diagonal, but nothing high-grade.  Left circumflex is a large vessel, gave off 3 marginals and an OM and posterolateral branch.  There was mild diffuse disease with 30% to 40% focal lesion in the midsection.  RCA was a large dominant vessel that gave off a large RV branch, PDA, and posterolateral.  There was a 30% lesion proximally and mild disease distally.  Left ventriculogram done in the RAO position showed markedly dilated LV with an EF of approximately 20% and global hypokinesis.  ASSESSMENT: 1. Moderate nonobstructive coronary artery disease as  described above. 2. Severe nonischemic cardiomyopathy with marked volume overload and     low cardiac output. 3. Mixed pulmonary hypertension. 4. Severe hypertension.  PLAN:  We will admit him for diuresis and aggressive titration of afterload by reduction with hydralazine and nitrates.  We can start milrinone as needed to support diuresis.     Chad Buckles. Bensimhon, MD     DRB/MEDQ  D:  12/21/2010  T:  12/22/2010  Job:  [id number]  Electronically Signed by Arvilla Meres MD on 01/10/2011 06:45:47 PM

## 2011-01-10 NOTE — Telephone Encounter (Signed)
[address] °

## 2011-01-17 ENCOUNTER — Ambulatory Visit: Payer: Medicare Other | Admitting: Internal Medicine

## 2011-01-17 ENCOUNTER — Other Ambulatory Visit (INDEPENDENT_AMBULATORY_CARE_PROVIDER_SITE_OTHER): Payer: Medicare Other | Admitting: *Deleted

## 2011-01-17 DIAGNOSIS — I5022 Chronic systolic (congestive) heart failure: Secondary | ICD-10-CM

## 2011-01-17 LAB — BASIC METABOLIC PANEL
CO2: 29 mEq/L (ref 19–32)
GFR: 70.05 mL/min (ref >60.00)
Glucose, Bld: 116 mg/dL — ABNORMAL HIGH (ref 70–99)
Potassium: 4.2 mEq/L (ref 3.5–5.1)
Sodium: 141 mEq/L (ref 135–145)

## 2011-01-31 ENCOUNTER — Ambulatory Visit (INDEPENDENT_AMBULATORY_CARE_PROVIDER_SITE_OTHER): Payer: Medicare Other | Admitting: *Deleted

## 2011-01-31 DIAGNOSIS — I4891 Unspecified atrial fibrillation: Secondary | ICD-10-CM

## 2011-02-03 ENCOUNTER — Other Ambulatory Visit: Payer: Self-pay | Admitting: Internal Medicine

## 2011-02-06 ENCOUNTER — Encounter: Payer: Self-pay | Admitting: Internal Medicine

## 2011-02-06 ENCOUNTER — Ambulatory Visit (INDEPENDENT_AMBULATORY_CARE_PROVIDER_SITE_OTHER): Payer: Medicare Other | Admitting: Internal Medicine

## 2011-02-06 ENCOUNTER — Telehealth: Payer: Self-pay | Admitting: Internal Medicine

## 2011-02-06 VITALS — BP 140/90 | HR 76 | Resp 18 | Ht 75.0 in | Wt 260.0 lb

## 2011-02-06 DIAGNOSIS — N529 Male erectile dysfunction, unspecified: Secondary | ICD-10-CM

## 2011-02-06 DIAGNOSIS — M25552 Pain in left hip: Secondary | ICD-10-CM | POA: Insufficient documentation

## 2011-02-06 DIAGNOSIS — M25559 Pain in unspecified hip: Secondary | ICD-10-CM

## 2011-02-06 DIAGNOSIS — I5022 Chronic systolic (congestive) heart failure: Secondary | ICD-10-CM

## 2011-02-06 DIAGNOSIS — I4891 Unspecified atrial fibrillation: Secondary | ICD-10-CM

## 2011-02-06 MED ORDER — SILDENAFIL CITRATE 100 MG PO TABS
100.0000 mg | ORAL_TABLET | Freq: Every day | ORAL | Status: DC | PRN
Start: 2011-02-06 — End: 2012-09-15

## 2011-02-06 MED ORDER — HYDRALAZINE HCL 50 MG PO TABS: 50.0000 mg | ORAL_TABLET | Freq: Three times a day (TID) | ORAL | Status: DC

## 2011-02-06 NOTE — Progress Notes (Signed)
HPI:   [name] is a 70 yo male with a h/o  DCM, CHF, HTN, atrial arrythmias (now chronic AF) and obesity.  He initially had an EF of 20-25% and single chamber ICD was implanted. EF then recovered to 50% (echo 2008) and had ICD explanted and BiV pacer placed due to bradycardia.  Had routine follow-up echo in 08/09/10 and showed EF back down to 25-30%. Medcial therapy intensified but f/u echo in 12/06/10 showed worsening EF of 20-25%. So patient underwent R&L cath on 12/21/10  Cath results: 1) Mod nonobs CAD (mLAD 70%, m-dLAD 40%; oDx 60%; mCFX 30-40%; pRCA 30%)  2) RA 21  RV 81/16/24  PA 81/36 (56) PCWP 33 (v to 43) PA sat 43% 48%, Fick 4.2/1.6  After cath admitted for diuresis and titration of hydralazine/nitrates.  Returns for f/u. Doing very well. Weight now stable at 255-260. Breathing much better. No CP, orthopnea, PND, LE edema. BP stable 120-140. Only complaint is severe pain in L hip. Barely able to walk. Saw chiropractor a few days ago and felt better for a few days but now in a lot of pain. No bleeding with coumadin. Wants to stop Imdur so he can restart sildenafil.   ROS: All systems negative except as listed in HPI, PMH and Problem List.  Past Medical History  Diagnosis Date  . Obesities, morbid   . Hypertension   . Hyperlipidemia   . Diabetes mellitus   . Polycythemia     vera requiring phlebotomy and treatment with hydroxyurea on a chronic bais. [facility].  . CHF (congestive heart failure)     secondary to nonischemic cardiomyopathy. EF initially 20-25% up to 50% in 2008. Echo 2/12 EF 20-25% . RHC 3/12: RA 21  RV 81/16/24  PA 81/36 (56) PCWP 33 (v to 43) PA sat 43% 48%.status post Medtronic biventricular ICD placement.(with Sprint Fidelis leads)-explanted--now with [employer] Anthem BivPM   . 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 Atrial fibrillation     Chronic. On coumadin.  . Coronary artery disease, non-occlusive    Cath 3/12: mLAD 70%, m-dLAD 40%; Dx 60%; mCFX 30-40%; pRCA 30%)    Current Outpatient Prescriptions  Medication Sig Dispense Refill  . acarbose (PRECOSE) 50 MG tablet Take 50 mg by mouth. Take 1/2 tablet 3 times daily       . allopurinol (ZYLOPRIM) 300 MG tablet Take 300 mg by mouth daily.        Marland Kitchen aspirin 81 MG tablet Take 81 mg by mouth daily.        . benazepril (LOTENSIN) 20 MG tablet Take 10 mg by mouth daily.       . carvedilol (COREG) 25 MG tablet Take 25 mg by mouth 2 (two) times daily.        . digoxin (LANOXIN) 0.125 MG tablet Take 125 mcg by mouth daily.        . furosemide (LASIX) 20 MG tablet Take 40 mg by mouth 2 (two) times daily.       Marland Kitchen glipiZIDE (GLUCOTROL) 10 MG tablet Take 10 mg by mouth 2 (two) times daily.        . Glucose Blood (BAYER BREEZE 2 TEST) DISK by In Vitro route. Test sugar 3 times a day       . hydrALAZINE (APRESOLINE) 25 MG tablet Take 25 mg by mouth 3 (three) times daily. Take 1 1/2 tablets three times a day       .  hydroxyurea (HYDREA) 500 MG capsule Take 500 mg by mouth 3 (three) times daily. May take with food to minimize GI side effects.       . insulin NPH (HUMULIN N,NOVOLIN N) 100 UNIT/ML injection Inject into the skin. Use as directed       . isosorbide mononitrate (IMDUR) 30 MG 24 hr tablet Take 30 mg by mouth daily.        . simvastatin (ZOCOR) 20 MG tablet TAKE 1 TABLET AT BEDTIME  30 tablet  2  . terazosin (HYTRIN) 2 MG capsule Take 2 mg by mouth at bedtime.        . traMADol (ULTRAM) 50 MG tablet Take 50 mg by mouth as needed.        . warfarin (COUMADIN) 6 MG tablet Take by mouth as directed.        Marland Kitchen DISCONTD: sildenafil (VIAGRA) 100 MG tablet Take 100 mg by mouth as needed.           PHYSICAL EXAM: Filed Vitals:   02/06/11 1031  BP: 140/90  Pulse: 76  Resp: 18  General:  Well appearing. No resp difficulty HEENT: normal Neck: supple. JVP flat. Carotids 2+ bilaterally; no bruits. No lymphadenopathy or thryomegaly appreciated. Cor:  PMI laterally displaced. Regular rate & rhythm. No rubs, gallops or murmurs. Lungs: clear Abdomen: obese soft, nontender, nondistended. No hepatosplenomegaly. No bruits or masses. Good bowel sounds. Extremities: no cyanosis, clubbing, rash, edema Neuro: alert & orientedx3, cranial nerves grossly intact. Moves all 4 extremities. Affect pleasant.    ECG: Atrial fib with V-pacing 76 bpm   ASSESSMENT & PLAN:

## 2011-02-06 NOTE — Assessment & Plan Note (Signed)
Chronic. Continue coumadin.  

## 2011-02-06 NOTE — Patient Instructions (Signed)
Stop Imdur Increase Hydralazine to 50mg  three times a day We have given you a prescription for Viagra Your physician recommends that you schedule a follow-up appointment in: 3 months

## 2011-02-06 NOTE — Assessment & Plan Note (Signed)
Restarting viagra. Knows not to take today but can start tomorrow once of Imdur > 24 hrs.

## 2011-02-06 NOTE — Telephone Encounter (Signed)
Pt has question re meds. °

## 2011-02-06 NOTE — Assessment & Plan Note (Signed)
I have asked him to make an appt with orthopedics at the [place].

## 2011-02-06 NOTE — Assessment & Plan Note (Signed)
Much improved. NYHA III. Volume status looks good. Will stop Imdur as he wants to get back on Viagra. Increase hydralazine to 50 tid.

## 2011-02-06 NOTE — Telephone Encounter (Signed)
Wanted to make sure what med he was suppose to stop, he pulled the imdur out of his meds

## 2011-02-06 NOTE — Progress Notes (Signed)
Addended by: Meredith Staggers on: 02/06/2011 11:40 AM   Modules accepted: Orders

## 2011-02-08 NOTE — Discharge Summary (Signed)
[name], [name]               ACCOUNT NO.:  [id number]  MEDICAL RECORD NO.:  [id number]           PATIENT TYPE:  I  LOCATION:  2928                         FACILITY:  [facility]  PHYSICIAN:  Rollene Rotunda, MD, FACCDATE OF BIRTH:  October 26, 1940  DATE OF ADMISSION:  12/21/2010 DATE OF DISCHARGE:  12/24/2010                              DISCHARGE SUMMARY   PRIMARY CARDIOLOGIST:  Bevelyn Buckles. 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  SECONDARY DIAGNOSES: 1. 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 2. Morbid obesity. 3. Hypertension 4. Hyperlipidemia. 5. Diabetes mellitus. 6. Polycythemia vera requiring phlebotomy and treatment with     hydroxyurea on a chronic basis. 7. History of wandering atrial pacemaker. 8. Chronic atrial fibrillation on Coumadin therapy. 9. Hypokalemia, requiring supplementation. 10.Stage III chronic kidney disease.  ALLERGIES:  No known drug allergies.  PROCEDURES:  Right and left heart cardiac catheterization performed on December 21, 2010, revealing moderate nonobstructive coronary artery disease with an EF of 20% and dilated left ventricle.  Right heart pressures as follows:  RA 21, RV 81/16/24, PA 81/37 (56), PCWP 33 with a V-wave of 43.  Fick cardiac output of 4.2 with an index of 1.6.  Peripheral vascular resistance 5.4 Wood units.  Systemic vascular resistance 1892 dynes.  Pulmonary saturation 43%.  HISTORY OF PRESENT ILLNESS:  A 70 year old male with prior history of nonischemic cardiomyopathy and biventricular ICD placement.  The patient previously had an EF of 20% to 25% which improved to 50% but on most recent echo in October 2011 was shown to be back down in a 25% to 30% range.  When seen in clinic on December 05, 2010, the patient's weight was up and he had more dyspnea than usual.  Decision was made to pursue right  and left heart cardiac catheterization.  HOSPITAL COURSE:  The patient presented to the [facility] on December 21, 2010, where he underwent right and left heart cardiac catheterization showing significantly elevated right heart pressures along with pulmonary capillary wedge pressure of 33.  His cardiac index was only 1.6.  His left heart catheterization showed moderate nonobstructive CAD.  EF was 20%.  Given severe nonischemic cardiomyopathy with marked volume overload and elevated filling pressures, decision was made to admit him for diuresis and titration of his medication.  The patient was monitored in the coronary step-down unit where he was aggressively diuresed with addition of hydralazine and nitrates.  His hydralazine has been titrated up to 37.5 mg t.i.d. with improved blood pressure control.  He has tolerated this well.  With diuresis, the patient has had a negative of approximately 6 L during this admission. His discharge weight today is 277 kg.  We will plan outpatient basic metabolic panel, reevaluate it this Wednesday, December 26, 2010.  He already has follow up with Dr. [name] as well as [facility] on January 03, 2011.  He is being discharged home today in fair condition.  DISCHARGE LABORATORY DATA:  Hemoglobin 11.9, hematocrit 36.9, WBC 9.3, platelets 393,000, INR 2.04.  Sodium  138, potassium 3.4 (replaced prior to discharge) chloride 97, CO2 31, BUN 20, creatinine 1.65, glucose 134. Total bilirubin 1.1, alkaline phosphatase 57, AST 16, ALT 20.  Total protein 6.2, albumin 3.1, calcium 8.4, magnesium 2.0.  BNP 309.  TSH 0.676.  MRSA screen was negative.  FOLLOWUP PLANS AND APPOINTMENTS:  The patient will follow up at [facility] for a basic metabolic panel on December 26, 2010.  He is to follow up with Dr. Arvilla Meres on January 03, 2011, at 12:15 p.m.  He will follow up on the same day with [facility] at  12:30 p.m.  DISCHARGE MEDICATIONS: 1. Allopurinol 300 mg daily. 2. Hydralazine 25 mg 1-1/2 tablet t.i.d. 3. Imdur 30 mg daily. 4. Potassium chloride 20 mEq b.i.d. 5. Terazosin 2 mg at bedtime. 6. Coreg 25 mg b.i.d. 7. Aspirin 81 mg daily. 8. Clear Eyes ophthalmic solution 1 drop both eyes daily p.r.n. 9. Coumadin 6 mg half a tablet on Wednesday, 1 tablet every other day     of the week. 10.Digoxin 0.125 mg b.i.d. 11.Glucotrol 5 mg 2 tablets b.i.d. 12.Lasix 80 mg b.i.d. 13.Lotensin 40 mg daily. 14.Novolin N 20 units the morning, 18 units at bedtime. 15.Zocor 20 mg daily.  STUDIES:  Followup basic metabolic panel on December 26, 2010, followup INR on January 03, 2011.  DURATION OF DISCHARGE ENCOUNTER:  Sixty minutes including physician time.     Nicolasa Ducking, ANP   ______________________________ Rollene Rotunda, MD, [facility]    CB/MEDQ  D:  12/24/2010  T:  12/25/2010  Job:  [id number]  Electronically Signed by Nicolasa Ducking ANP on 12/31/2010 12:03:51 PM Electronically Signed by Rollene Rotunda MD [facility] on 02/08/2011 11:42:08 AM

## 2011-02-26 NOTE — Assessment & Plan Note (Signed)
[facility]                         ELECTROPHYSIOLOGY OFFICE NOTE   HERSH, MINNEY                      MRN:          [id number]  DATE:07/21/2007                            DOB:          Dec 08, 1940    HISTORY:  Mr. Gaertner returns today for followup.  He is a very pleasant  middle-aged man with congestive heart failure and non-ischemic  cardiomyopathy, status post BiV ICD insertion approximately two years  ago.  The patient returns today for followup.  He has been found to have  paroxysmal atrial fibrillation.  The patient denies chest pain.  He  denies shortness of breath.  His heart failure is class 2.  He denies  peripheral edema.   PHYSICAL EXAMINATION:  GENERAL:  He is a pleasant, well-appearing,  middle-aged man, in no distress.  VITAL SIGNS:  Blood pressure 144/88, pulse 60 and regular, respirations  16, weight 300 pounds.  NECK:  No jugular venous distention.  LUNGS:  Clear bilaterally to auscultation.  There are rales in the  bases.  CARDIOVASCULAR:  A regular rate and rhythm with normal S1 and S2.  EXTREMITIES:  Demonstrated trace peripheral edema bilaterally.   MEDICATIONS:  1. Furosemide 80 mg daily.  2. Coreg 25 mg, two tab twice daily.  3. Insulin as directed.  4. Allopurinol 300 mg daily.  5. Glipizide 10 mg, two tab twice daily.  6. Simvastatin 20 mg daily.  7. Benazepril 30 mg daily.  8. Potassium supplements.   Interrogation of his pacemaker demonstrates a [employer].  The patient went back into sinus rhythm, back on July 17, 2007.  He  has had no VT.  His impedance were 432 in the atrium and 456 in the  right ventricle, 696 in the left ventricle, with P-waves of 3.  There  were no R-waves obtained today.  He was V-pacing approximately 88% of  the time.   IMMUNIZATIONS:  1. Non-ischemic cardiomyopathy.  2. Congestive heart failure.  3. Left bundle branch block.  4. New onset paroxysmal atrial  fibrillation.   DISCUSSION:  Today we spent an extensive amount of time discussing the  importance of Coumadin therapy with Mr. Montalto.  His [place] score is 3.  I have recommended that he start on Coumadin.  I have given him a  prescription and a referral to our Coumadin Clinic.   FOLLOWUP:  I will see him back in several months.     Doylene Canning. Ladona Ridgel, MD  Electronically Signed    GWT/MedQ  DD: 07/21/2007  DT: 07/21/2007  Job #: [id number]

## 2011-02-26 NOTE — Assessment & Plan Note (Signed)
[facility]                            CARDIOLOGY OFFICE NOTE   [name], [name]                      MRN:          [id number]  DATE:05/05/2008                            DOB:          Feb 03, 1941    INTERVAL HISTORY:  [name] is a 70 year old male with a history of  nonischemic cardiomyopathy and congestive heart failure, previous  ejection fraction was 30%, but now it is back up to 50%.  He had a  previous CPX test, which showed poor functional capacity due to heart  failure and chronotropic incompetence.  However, he is on good medical  therapy.  The remainder of his past medical history is notable for  morbid obesity, chronic atrial fibrillation, diabetes, hypertension,  polycythemia for which he takes hydroxyurea, and also gets occasional  phlebotomies.  He is followed up at the [place].   He returns today for routine followup.  He is doing well.  He denies any  chest pain or shortness of breath.  He states he gets around slowly, but  could not do anything he really needs to do.  He has not had any chest  pain or shortness of breath.  No lower extremity edema.   CURRENT MEDICATIONS:  1. Digoxin 0.125 a day.  2. Piroxicam.  3. Lasix 80 b.i.d.  4. Coreg 25 b.i.d.  5. Insulin.  6. Allopurinol 300 a day.  7. Hydroxyurea 500 t.i.d.  8. Simvastatin 20 a day.  9. Aspirin 81 a day.  10.Benazepril 30 mg a day (per question dose).  11.Potassium 10 b.i.d.  12.Coumadin.  13.Acarbose 50 mg half t.i.d.  14.Glipizide 10 b.i.d.  15.Terazosin 2 mg q.h.s.   PHYSICAL EXAMINATION:  GENERAL:  He is in no acute distress, ambulatory  around the clinic slowly with a cane and in no respiratory difficulty.  VITAL SIGNS:  Blood pressure is 115/72, heart rate 73, weight is 270  pounds, which is down about 23 pounds over the last 8 months.  HEENT:  Normal.  NECK:  No obvious JVD.  Carotids are 2+ bilaterally without bruits.  There is no lymphadenopathy or  thyromegaly.  CARDIAC:  PMI is nonpalpable.  He sounds regular, although EKG is mildly  irregular.  There are no murmurs, rubs, or gallops.  LUNGS:  Clear.  ABDOMEN:  Soft, obese, nontender, and nondistended.  No appreciable  hepatosplenomegaly.  No bruits.  No masses.  EXTREMITIES:  Warm with no cyanosis, clubbing, or edema.  No rash.  NEURO:  Alert and oriented x3.  Cranial nerves II through XII are  grossly intact.  Moves all 4 extremities without difficulty.  Affect is  pleasant.   EKG shows chronic atrial fibrillation.   ASSESSMENT/PLAN:  1. Congestive heart failure with preserved ejection fraction.  Fluid      status looks good.  He is [place] Heart Association class III.  He      has defibrillator in place.  2. Chronic atrial fibrillation.  He is well rate controlled.  Continue      Coumadin.  3. Hypertension, well controlled.  4. Hyperlipidemia, followed  by the [place].  5. Erectile dysfunction.  We will renew his prescription for Viagra.      He knows not to take any nitroglycerin with this.   DISPOSITION:  He is doing quite well.  We will see him back in 6 months  for routine followup.     Chad Buckles. Bensimhon, MD  Electronically Signed    DRB/MedQ  DD: 05/05/2008  DT: 05/06/2008  Job #: [id number]

## 2011-02-26 NOTE — Assessment & Plan Note (Signed)
[name] [name]                          CHRONIC HEART FAILURE NOTE   NAME:[name], [name]                      MRN:          [id number]  DATE:06/17/2007                            DOB:          Jul 02, 1941    PRIMARY CARE PHYSICIAN:  The patient is followed by Thresa Ross, PA, at  [facility].   PRIMARY CARDIOLOGIST:  Bevelyn Buckles. Bensimhon, M.D.   [name] returns today for followup of his congestive heart failure,  which is secondary to nonischemic cardiomyopathy.  When I last saw [name] in August, he was complaining of increased fatigue, decreased  energy level.  I checked blood work on [name] and scheduled him for  a CPX test for further evaluation.  [name] underwent CPX test on  June 01, 2007.  The patient's interpretation of CPX test demonstrated  moderately to severe reduced functional capacity with a peak VO2 of 9.5  mL per kg per minute, which was 54% of age and gender predicted.  The  patient had a near-maximal effort with a peak RER at 1.09.  CPX was  stopped due to leg fatigue.  Exercise testing with gas exchange showed a  moderately to severely reduced functional capacity due to advanced heart  failure and chronotropic incompetence.  Preliminary report.  Final  reading pending Dr. Prescott Gum evaluation.  Blood work at that time  showed a potassium of 4.0, BUN 28, creatinine 1.8, BNP of 69.   [name] returns today stating he still has decreased energy level,  ongoing fatigue in the setting of chronic shortness of breath without  orthopnea, PND, or peripheral edema.  The patient denies any firing from  his ICD.  He is pending a sleep study.  The patient states that this has  been arranged through the [place] in [place].   PAST MEDICAL HISTORY:  1. Congestive heart failure secondary to nonischemic cardiomyopathy      with an EF currently 55% by echocardiogram.  Status post      implantation of a Medtronic Maxima 7304  biventricular ICD in 2006.  2. Status post cardiac catheterization in 1997 that showed nonischemic      dilated cardiomyopathy with normal coronaries.  Also, no evidence      of renal artery stenosis.  3. Type 2 diabetes.  4. Morbid obesity.  5. History of polycythemia vera on hydroxyurea therapy followed by the      [facility] with a history of phlebotomies.   REVIEW OF SYSTEMS:  As stated above.   CURRENT MEDICATIONS:  1. Piroxicam 20 mg daily.  2. Acarbose 25 mg t.i.d.  3. Furosemide 80 mg b.i.d.  4. Coreg 50 mg b.i.d.  Note, the patient takes 2 25 mg tablets twice a      day.  5. Insulin as directed.  6. Allopurinol 300 mg daily.  7. Glipizide 10 mg 2 tablets b.i.d.  8. Hydroxyurea 500 mg t.i.d.  9. Simvastatin 20 mg daily.  10.Aspirin 81 mg b.i.d.  11.Benazepril 30 mg daily.  12.K-Dur 10 mEq b.i.d.   PHYSICAL EXAM:  Weight  today 296 pounds, weight is down 4 pounds, blood  pressure 120/70 with a heart rate of 56.  [name] is in no acute distress.  HEENT:  Unremarkable.  LUNGS:  Clear to auscultation bilaterally.  CARDIOVASCULAR:  Reveals an S1 with an S2, regular rate and rhythm.  ABDOMEN:  Obese, soft, and nontender.  LOWER EXTREMITIES:  Without cyanosis, clubbing, or edema.  NEUROLOGIC:  Alert and oriented x3.   ICD interrogation done today, no episodes of ventricular fibrillation,  ventricular tachycardia noted.  Heart rate variability without much  variation.  Patient activity averages between 1 and 2 hours a day.  Results of ICD interrogation discussed with Dr. [name], who has asked  that the rate response be increased to 8, as the patient is atrially  pacing at 53.5% of the time.  Ventricular pacing 100% of the time.  The  patient is to transmit on July 17, 2007.  We will repeat lab work  today, see the patient back in 10 to 12 weeks.      Dorian Pod, ACNP  Electronically Signed      Bevelyn Buckles. Bensimhon, MD  Electronically Signed    MB/MedQ  DD: 06/17/2007  DT: 06/17/2007  Job #: [id number]   cc:   Thresa Ross, PA

## 2011-02-26 NOTE — Assessment & Plan Note (Signed)
[facility]                            CARDIOLOGY OFFICE NOTE   [name], [name]                      MRN:          [id number]  DATE:08/26/2007                            DOB:          08-03-1941    INTERVAL HISTORY:  The patient is a very pleasant 70 year old male with  a history of a nonischemic cardiomyopathy and congestive heart failure.  He has recently had an ejection fraction of about 30%, but now it is  back to about 50-55%.  He did have a CPX test which showed very poor  functional capacity due to heart failure and chronotropic incompetence.  He is on a good medical therapy.  He was recently seen by EP.  He does  have a history of sort of atypical atrial tachycardias, however, he was  found to have clear atrial fibrillation at his last visit and was  started on Coumadin.   Overall he said he is doing very well.  He denies any chest pain or  shortness of breath.  He gets around slowly, but feels that his energy  is better since his pacemaker was reprogrammed.  He does not have any  orthopnea, no PND, no lower extremity edema.  He was supposed to go to a  sleep study, but he did not show up.  Remainder of medical history is  notable for diabetes, morbid obesity, hypertension, and polycythemia on  hydroxyurea and also gets occasional phlebotomies.   CURRENT MEDICATIONS:  1. Piroxicam 20 mg a day.  2. Calburst 25 mg t.i.d.  3. Lasix 80 mg b.i.d.  4. Coreg 50 mg b.i.d.  5. Insulin.  6. Allopurinol 300 mg a day.  7. Glipizide 20 mg b.i.d.  8. Hydroxyurea 500 mg t.i.d.  9. Simvastatin 20 mg.  10.Aspirin 81 mg.  11.Benazepril 30 mg a day.  12.Potassium 10 mg b.i.d.  13.Coumadin.   PHYSICAL EXAMINATION:  GENERAL:  He is in no acute distress.  He  ambulates around the clinic slowly with a cane.  Respirations are  unlabored.  VITAL SIGNS:  Blood pressure is 112/74, heart rate 65, weight 293 which  is down about 5 pounds.  HEENT:   Normal.  NECK:  Supple.  There is no JVD.  Carotids are 2+ bilaterally without  any bruits.  There is no lymphadenopathy or thyromegaly.  HEART:  PMI is not palpable.  There is a regular rate and rhythm.  No  murmurs, rubs, or gallops.  LUNGS:  Clear.  ABDOMEN:  Soft, obese, nontender, and nondistended.  No  hepatosplenomegaly, no bruits, and no masses.  EXTREMITIES:  Warm with no cyanosis, clubbing, or edema.  No rash.  NEUROLOGY:  Alert and oriented x3.  Cranial nerves II-XII grossly  intact.  Moves all four extremities without difficulty.  Affect is  pleasant.   EKG shows atrial fibrillation at a rate of 65.   Interrogation of his device shows that he is in and out of atrial  fibrillation and he does have high ventricular rates occasionally,  although, there is just a little data to look at.  ASSESSMENT:  1. Congestive heart failure.  He is doing well.  He is due for a      repeat echo.  He is on good medical therapy.  2. Atrial fibrillation, rate is currently well controlled.  We will      start him on digoxin for some of his high rate episodes.  We will      have him transmit a report in two weeks to further evaluate.  3. Hypertension, well controlled.   DISPOSITION:  He will see me back in four months for routine follow-up.     Bevelyn Buckles. Bensimhon, MD  Electronically Signed    DRB/MedQ  DD: 08/26/2007  DT: 08/26/2007  Job #: [id number]

## 2011-02-26 NOTE — Assessment & Plan Note (Signed)
[facility]                            CARDIOLOGY OFFICE NOTE   Chad, Leon                      MRN:          [id number]  DATE:12/15/2007                            DOB:          Dec 03, 1940    INTERVAL HISTORY:  [name] is a very pleasant 70 year old male with  a history of nonischemic cardiomyopathy and congestive heart failure,  previous ejection fraction was 30% but now it is back up to 50%.  He had  a CPX test which showed very poor functional capacity due to heart  failure chronotropic incompetence.  He is on good medical therapy.  Remainder of his past medical history is notable for chronic atrial  fibrillation, diabetes, obesity, hypertension and polycythemia and  hydroxyurea and also gets occasional phlebotomies.   He returns today for routine follow-up.  He is doing well a denies any  chest pain or shortness of breath.  No lower extremity edema.  He says  his breathing is okay to do all the activities he needs.  No orthopnea  or PND.  He has not had any bleeding on his Coumadin.  Current  medications are paroxetine of 20 a day, Lasix 80 b.i.d., Coreg 50  b.i.d., insulin as directed allopurinol 300 day, glipizide 20 b.i.d.,  hydroxyurea 500 t.i.d., simvastatin 20 a day, aspirin 81, benazepril  unsure dose thinks it is 30 mg a day, potassium 10 b.i.d. and Coumadin  and Viagra p.r.n.   PHYSICAL EXAM:  He is in no acute distress.  Ambulates in clinic slowly.  With a cane.  Respirations are unlabored.  Blood pressure is 103/70, heart rate is 67,  weight is 282 which is down about 10 pounds.  HEENT is normal.  The neck is supple.  No JVD.  Carotid 2+ bilateral without bruits.  There is no lymphadenopathy or thyromegaly.  HEART:  PMI is not palpable.  He is irregular   regular.  No murmurs, rubs or gallops.  LUNGS:  Clear.  ABDOMEN:  Soft, obese, nontender, nondistended hepatosplenomegaly, no  bruits or masses.  Good bowel  sounds.  EXTREMITIES:  Warm with no cyanosis, clubbing or edema.  No rash.  NEURO:  Alert and oriented x3.  Cranial nerves II-XII are grossly  intact.  Moves all four extremities without difficulty.  Affect is  pleasant.   EKG shows atrial fibrillation with ventricular pacing.   ASSESSMENT/PLAN:  1. Congestive heart failure, he is doing well.  Fluid status looks      good.  Chronic NYHA class III.  He is a defibrillator in place.  2. Atrial fibrillation is well rate-controlled.  Continue Coumadin.      He is asymptomatic from this.  3. Chronic hypertension well-controlled.  4. Hyperlipidemia is followed by the [facility].   DISPOSITION:  Will see him back in clinic in 4 months for routine follow-  up.     Bevelyn Buckles. Bensimhon, MD  Electronically Signed    DRB/MedQ  DD: 12/15/2007  DT: 12/15/2007  Job #: [id number]

## 2011-02-26 NOTE — Assessment & Plan Note (Signed)
[facility]                         ELECTROPHYSIOLOGY OFFICE NOTE   Leon, Chad                      MRN:          [id number]  DATE:10/20/2007                            DOB:          1941-02-06    HISTORY:  [name] returns today for followup.  He is a very pleasant  male with a history of nonischemic cardiomyopathy, congestive heart  failure, left bundle branch block and atrial fibrillation, who is status  post ICD insertion back in August 2006.  He returns today for followup.  He has been stable.  He denies chest pain.  He denies shortness of  breath.  He denies palpitations.  He has had no intercurrent ICD  therapies.   MEDICATIONS:  1. Furosemide 80 mg twice daily.  2. Coreg 25 mg 2 tablets twice daily.  3. Insulin as directed.  4. Allopurinol 300 a day.  5. Glipizide 10 mg 2 tablets twice daily.  6. Simvastatin 20 a day.  7. Aspirin 81 twice a day.  8. Benazepril 30 mg daily.  9. He is also on Coumadin.  10.Potassium.   PHYSICAL EXAMINATION:  GENERAL:  He is a pleasant, well-appearing middle-  aged man in no acute distress.  VITAL SIGNS:  Blood pressure was 115/96,  the pulse was 64 and regular, respirations were 18.  Weight was 283  pounds, 10 pounds from his visit back in November.  NECK:  Revealed no jugular venous distention.  LUNGS:  Clear bilaterally to auscultation.  No wheezes, rales or rhonchi  are present.  There were decreased breath sounds in the right base.  CARDIAC:  Regular regular and rhythm.  Normal S1-S2.  No murmurs, rubs  or gallops.  The PMI was enlarged and laterally displaced.  ABDOMEN:  Soft, nontender, nondistended.  There was no organomegaly.  EXTREMITIES:  Demonstrated no cyanosis, clubbing or edema.  There is  trace peripheral edema.   Interrogation of his defibrillator demonstrates a Medtronic Maximo.  The  patient has underlying rhythm is AFib.  His fib waves were 0.8.  The  impedance 432 in  the atrium.  The R-waves were 15.  The threshold of 1.2  and the impedance 472 ohms.  In the LV, the impedance was 680.  In the  threshold 1.5 volts to 0.4 milliseconds.  Battery voltage was 3.06  volts.   IMPRESSION:  1. Nonischemic cardiomyopathy.  2. Congestive heart failure.  3. Atrial fibrillation.  4. Status post biventricular automatic implantable cardioverter      defibrillator insertion secondary to all of the above.   DISCUSSION:  Overall, [name] is stable.  Despite his AFib, his  heart failure is class II.  For this reason because he is asymptomatic,  I have recommended a period of watchful waiting realizing that if his  hardware failure were to worsen, then among other things, consideration  for aggressive antiarrhythmic treatment for rhythm control would be an  option with this patient in mind.     Doylene Canning. Ladona Ridgel, MD  Electronically Signed    GWT/MedQ  DD: 10/20/2007  DT: 10/20/2007  Job #:  [id number] 

## 2011-02-26 NOTE — Assessment & Plan Note (Signed)
[facility]                            CARDIOLOGY OFFICE NOTE   [name], [name]                      MRN:          [id number]  DATE:02/16/2007                            DOB:          Apr 17, 1941    PRIMARY CARE PHYSICIAN:  Thresa Ross, PA, [place], [place].   INTERVAL HISTORY:  Mr. Formica is a very pleasant, 70 year old male with  a history of nonischemic cardiomyopathy with a recovered ejection  fraction which is now 55%. He is status post BiV ICD. He also has a  history of hypertension, diabetes, hyperlipidemia and polycythemia vera  being treated with hydroxyurea. He has been followed closely by Dorian Pod in the CHF clinic. He returns today for routine followup. Overall  he is doing quite well. He denies any chest pain or shortness of breath.  He has not had any lower extremity edema. He does sleep on four pillows  but this is for comfort and not necessarily related to orthopnea. He has  not had any firings of his ICD. Recently his Benazepril was decreased  from 20 mg a day to 15 mg a day but he is unsure why that was done. He  does not recall whether it was due to his kidney or potassium.   CURRENT MEDICATIONS:  1. Simvastatin 20 a day.  2. Hydroxyurea 500 t.i.d.  3. Glipizide 20 b.i.d.  4. Allopurinol 300 a day.  5. Potassium 10 a day.  6. Insulin.  7. Coreg 25 b.i.d.  8. Benazepril 15 a day.  9. Aspirin 81.  10.Lasix 80 b.i.d.  11.Acarbose 25 b.i.d.  12.Piroxicam 20 a day.   PHYSICAL EXAMINATION:  GENERAL:  He is in no acute distress. He  ambulates around the clinic without any respiratory difficulty. He is  morbidly obese.  VITAL SIGNS:  Blood pressure is 150/88, his heart rate 61, his weight is  288 which is down 6 pounds.  HEENT:  Normal.  NECK:  Supple and thick. Unable to accurately access his JVD but it does  appear flat. Carotids are 2+ bilaterally without any bruits. There is no  lymphadenopathy or  thyromegaly.  CARDIAC:  He has a regular rate and rhythm with occasional ectopy. A  very soft systolic ejection murmur at the left sternal border. No  gallop.  LUNGS:  Clear.  ABDOMEN:  Obese, nontender, nondistended. There is no hepatosplenomegaly  appreciated. No bruits, no masses, good bowel sounds.  EXTREMITIES:  Warm with no clubbing, cyanosis or edema. No rash.  NEUROLOGIC:  He is alert and oriented x3. Cranial nerves II-XII are  grossly intact. He moves all 4 extremities without any difficulty.  Affect is pleasant.   EKG shows AV pacing with occasional PACs. Heart rate is 61.   ASSESSMENT/PLAN:  1. Congestive heart failure with recovered ejection fraction. He is      stable. Volume status looks good. Continue current therapy.  2. Hypertension which is poorly controlled. I am unsure why his      benazepril was decreased. His last renal function from April looked      good  with normal potassium. We have gone ahead and stopped his      potassium, increased his benazepril to 30 a day. We will check a      BMET in one week.  3. Hyperlipidemia. Cholesterol was at goal with an LDL of 53.  4. Erectile dysfunction. We have gone ahead and given him a      prescription for Cialis which he was on before reminding him that      he is not to take any nitroglycerin with this. He is well aware of      that.  5. Obesity. I will again encourage him to try to be more active and      continue to lose weight.   DISPOSITION:  Will see him back in the heart failure clinic in 3 months  and then again I will see him in 6 months.     Bevelyn Buckles. Bensimhon, MD     DRB/MedQ  DD: 02/16/2007  DT: 02/16/2007  Job #: [id number]   cc:   Thresa Ross, PA

## 2011-02-26 NOTE — Assessment & Plan Note (Signed)
[facility]                          CHRONIC HEART FAILURE NOTE   [name], [name]                      MRN:          [id number]  DATE:05/19/2007                            DOB:          1941/03/25    PRIMARY CARE PHYSICIAN:  Thresa Ross, PA at [facility].   PRIMARY CARDIOLOGIST:  Dr. Nicholes Mango.   [name] returns today for followup of his congestive heart failure  which is secondary to nonischemic cardiomyopathy. [name] states he  has been doing quite well. He complains of ongoing fatigue and decreased  energy, however he also states his activity level has not been very  strenuous. His wife states that he does not do anything. Indeed Mr.  Leon weight is up 12 pounds since May. He denies any orthopnea or  PND. He does complain of dyspnea on exertion. No shortness of breath at  rest. Denies any lower extremity edema or abdominal bloating. Denies any  firing from his ICD. He states he has been doing quite well. He is most  concerned about his erectile dysfunction and the fact that he has to  take a medication for this. Apparently was seen recently at the  hem/oncology department in June through the [facility]. Lab work done on  June 19 shows a WBC of 10/8, H&H 17.2 and 50.9. Patient's weight at that  time was 295 pounds. The patient underwent phlebotomy procedure which  patient states he did not have any problems with.   PAST MEDICAL HISTORY:  Includes:  1. Congestive heart failure secondary to non ischemic cardiomyopathy      with an ejection fraction currently 55% by echocardiogram, status      post implantation of a Medtronic Maximo 7304 biventricular ICD      August 2006, status post cardiac catheterization in 1997 that      showed nonischemic dilated cardiomyopathy with normal coronaries,      also no evidence of renal artery stenosis.  2. Type 2 diabetes.  3. Morbid obesity.  4. History of polycythemia vera on  hydroxyurea therapy followed by [facility] with a history of phlebotomies.   REVIEW OF SYSTEMS:  As stated above.   CURRENT MEDICATIONS:  1. Piroxicam 20 mg daily.  2. Acarbose 25 mg t.i.d.  3. Furosemide 80 mg b.i.d.  4. Coreg, note it was initially thought that patient took 25 mg b.i.d.      however in reviewing his bottle of medications he is actually      taking 50 mg b.i.d. He states he has been on the same dose for a      while.  5. Insulin as directed.  6. Allopurinol 300 mg daily.  7. Glipizide 10 mg 2 tablets b.i.d.  8. Hydroxyurea 500 mg t.i.d.  9. Simvastatin 20 mg daily.  10.Aspirin 81 mg b.i.d.  11.Benazepril 30 mg daily.  12.K-Dur 10 mEq b.i.d.   CLINICAL DATA:  ICD interrogation indicates no ICD firing. No changes or  adjustments in settings.   PHYSICAL EXAMINATION:  Weight 300 pounds, blood pressure 114/83,  with a  heart rate of 60. [name] is in no acute distress.  No jugular venous distension at 45 degree angle.  LUNGS: Clear to auscultation.  CARDIOVASCULAR EXAM: Reveals an S1 and S2.  ABDOMEN: Obese, soft, nontender.  LOWER EXTREMITIES: Without clubbing, cyanosis, or edema.  NEUROLOGICALLY: Alert and oriented.   IMPRESSION:  Congestive heart failure with ejection fraction of 55% at  this time. Fluid volume status stable, blood pressure stable. He will  continue current medications. I am going to leave his Coreg at 50 mg  b.i.d. as this is an appropriate dose for patient's weight. Patient  however continues to complain of ongoing fatigue and decreased energy  level. I am going to have patient proceed with CPX test to reevaluate  his status, check blood work today also as patient had his lisinopril  increased by Dr. [name] at last visit in May and never returned for  follow up lab work. I will see patient back after his CPX test.      Dorian Pod, ACNP  Electronically Signed      Bevelyn Buckles. Bensimhon, MD  Electronically  Signed   MB/MedQ  DD: 05/19/2007  DT: 05/19/2007  Job #: [id number]

## 2011-02-26 NOTE — Assessment & Plan Note (Signed)
[facility]                         ELECTROPHYSIOLOGY OFFICE NOTE   TERRILL, ALPERIN                      MRN:          [id number]  DATE:12/06/2008                            DOB:          05-28-1941    [name] returns today for followup.  He is a very pleasant male with  a history of nonischemic cardiomyopathy and congestive heart failure who  is status post ICD insertion.  He returns today for followup.  He also  has history of left bundle-branch block and AFib.  The patient denies  chest pain.  He is class II heart failure symptoms.   CURRENT MEDICATIONS:  1. Digoxin 0.125 daily.  2. Benazepril 40 a day.  3. Terazosin 2 mg daily.  4. Glipizide 10 twice daily.  5. Coreg 25 twice daily.  6. Coumadin as directed.  7. Potassium 10 mEq twice daily.  8. Aspirin 81 daily.  9. Simvastatin 20 daily.  10.Hydroxyurea 500 mg t.i.d.  11.Allopurinol 300 a day.  12.Insulin as directed.  13.Lasix 80 twice a day.  14.Piroxicam daily.   PHYSICAL EXAMINATION:  He is a pleasant middle-aged man in no acute  distress.  His blood pressure today was 130/80, the pulse was 67 and  regular, respirations were 18, the weight was 278 pounds.  The neck  revealed no jugular venous distention.  The lungs are clear bilaterally  to auscultation except for minimal rales in the bases.  There are no  wheezes or rhonchi.  The cardiovascular exam revealed regular rate and  rhythm.  Normal S1 and S2.  PMI was enlarged and laterally displaced.  The abdominal exam was soft, nontender.  There is no organomegaly.  The  extremities demonstrated trace peripheral edema.  The patient did have a  boot on in his lower extremities.   Interrogation of his defibrillator demonstrates a Medtronic Maximo.  The  patient's underlying atrial rhythm was AFib.  P-waves were 1, the R-  waves were 13, the impedances were 456 in the right ventricle and 800 in  the left ventricle.  The  threshold was 1 at 0.2 in the right ventricle  and 2 at 0.4 in the left ventricle.  Battery voltage was 2.95 volts.  Today, we turned his outputs up to 3 at 0.4 in the left ventricle to  assure LV capture.  We increased his rate today from 50-70 to help  increase his biV pacing percentage.   IMPRESSION:  1. Nonischemic cardiomyopathy.  2. Congestive heart failure.  3. Status post biventricular implantable cardioverter-defibrillator      insertion.   DISCUSSION:  Overall, [name] is stable.  He is not pacing as much  as we would like and we have changed his rate today to help improve  that.  I will see the patient back for ICD followup in several months.     Doylene Canning. Ladona Ridgel, MD  Electronically Signed    GWT/MedQ  DD: 12/06/2008  DT: 12/07/2008  Job #: [id number]

## 2011-02-28 ENCOUNTER — Ambulatory Visit (INDEPENDENT_AMBULATORY_CARE_PROVIDER_SITE_OTHER): Payer: Medicare Other | Admitting: *Deleted

## 2011-02-28 DIAGNOSIS — I4891 Unspecified atrial fibrillation: Secondary | ICD-10-CM

## 2011-02-28 LAB — POCT INR: INR: 1.9

## 2011-03-01 NOTE — Op Note (Signed)
NAMEHERMEN, MARIO               ACCOUNT NO.:  [id number]   MEDICAL RECORD NO.:  [id number]          PATIENT TYPE:  INP   LOCATION:  6531                         FACILITY:  [facility]   PHYSICIAN:  Doylene Canning. Ladona Ridgel, M.D.  DATE OF BIRTH:  1941/08/11   DATE OF PROCEDURE:  06/10/2005  DATE OF DISCHARGE:                                 OPERATIVE REPORT   PROCEDURE PERFORMED:  Bivalve ICD implantation.   INTRODUCTION:  The patient is 70 year old male with a nonischemic  cardiomyopathy and severe LV dysfunction with an EF of 15%. He has class III  heart failure despite maximal medical therapy. He has a QRS duration of 115  milliseconds with first-degree A-V block, but underwent tissue Doppler echo  that demonstrated marked dyssynchrony in the lateral posterolateral walls of  the left ventricle. For this reason, he is referred for bivalve ICD  implantation.   PROCEDURE PERFORMED:  After formal consent was obtained, the patient was  taken to the diagnostic EP lab in the fasting state. After the usual  preparation and draping, intravenous fentanyl and midazolam was given for  sedation. Lidocaine 30 mL was infiltrated into the left infraclavicular  region. A 9 cm incision was carried out over this region. Electrocautery was  utilized to dissect down to the fascial plane. The left subclavian vein was  punctured x3 after 10 mL of contrast demonstrated it to be patent. The  Medtronic model O152772, 65-cm active fixation defibrillation lead serial  number [id number] was advanced into the right ventricle and the Medtronic  model 5076, 52-cm active fixation pacing lead serial number [id number] was  advanced to the right atrium. Mapping was carried out in the right ventricle  at the final site on the RV septum. The R-waves measured 50 mV and the  pacing impedance 764 ohms with the lead actively fixed, the pacing threshold  at 0.4 volts at 0.5 milliseconds. With the RV/defibrillation lead in  satisfactory  position, attention was then turned to placement of the atrial  lead. It was placed in the right atrial appendage where the P-waves measured  5 mV, the pacing impedance was 699 ohms, and the pacing threshold 0.4 volts  at 0.5 milliseconds with the lead actively fixed. With both atrial and  ventricular leads in satisfactory position, attention was then turned to  placement of the LV lead. Of note, the diaphragmatic stimulation was not  observed with 10 volts pacing in both the atrium and ventricle. The coronary  sinus hexapolar catheter was inserted into the guiding catheter and advanced  through a hemostatic sheath by way of the left subclavian vein into the  right atrium. The coronary sinus was cannulated without difficulty.  Venography of the coronary sinus was carried out demonstrating a nice  lateral vein. The Medtronic model 4194, 88 cm passive fixation LV pacing  lead serial number [id number] was then advanced into the left lateral wall  of the left ventricle. LV waves measured 3 mV. The pacing impedance was 1120  ohms, and the pacing threshold was a voltage of 0.5 milliseconds in this  location. Ten volt pacing  did not stimulate the diaphragm in this location.  With both right atrial, right ventricular, and left ventricular leads in  satisfactory position, they were secured to the subpectoralis fascia with a  figure-of-eight silk suture. The sewing sleeve was secured with a silk  suture and electrocautery was utilized to make a subcutaneous pocket.  Kanamycin irrigation was utilized to irrigate the pocket and electrocautery  utilized to assure hemostasis. The Medtronic in sync maximal model 7304  biventricular ICD serial number [id number] was connected to the atrial RV  and LV leads and placed back in the subcutaneous pocket. Generator was  secured with a silk suture. Defibrillation threshold testing was then  carried out.   After the patient was more deeply sedated with fentanyl  and Versed, VF was  induced with a T-wave shock. VF was terminated with a 15 joules shock. Five  minutes was allowed to elapse and a second DFT test was carried out. Again,  VF was induced with a T-wave shock and, again, a 15 joules shock was  delivered terminating ventricular fibrillation and restoring sinus rhythm.  At this point, no additional defibrillation threshold testing was carried  out and the incision was closed with a layer of 2-0 Vicryl followed by a  layer of 3-0 Vicryl, followed by a layer of 4-0 Vicryl. Benzoin was painted  on the skin and Steri-Strips were applied and a pressure dressing was  placed. The patient was returned to his room in satisfactory condition.   COMPLICATIONS:  There were no immediate procedure complications.   RESULTS:  This demonstrates successful implantation of a Medtronic  biventricular ICD in a patient with a nonischemic cardiomyopathy, class III  heart failure, and marked dyssynchrony on his tissue Doppler echo in the  setting of  significant heart failure.           ______________________________  Doylene Canning. Ladona Ridgel, M.D.     GWT/MEDQ  D:  06/10/2005  T:  06/10/2005  Job:  [id number]   cc:   Arvilla Meres, M.D. [facility]  [facility]  [address]  [place]  [place] [address]

## 2011-03-01 NOTE — Discharge Summary (Signed)
[name], [name]               ACCOUNT NO.:  [id number]   MEDICAL RECORD NO.:  [id number]          PATIENT TYPE:  INP   LOCATION:  6531                         FACILITY:  [facility]   PHYSICIAN:  Doylene Canning. Ladona Ridgel, M.D.  DATE OF BIRTH:  October 17, 1940   DATE OF ADMISSION:  06/10/2005  DATE OF DISCHARGE:  06/11/2005                                 DISCHARGE SUMMARY   DISCHARGE DIAGNOSES:  1.  Discharging day #1, status post implantation of a Medtronic [facility]      Maximo cardioverter-defibrillator, model 7304, with left ventricular      lead pacing capability, a defibrillator threshold study less than or      equal to 15 joules.  2.  Nonischemic cardiomyopathy, ejection fraction 25%, with significant      dyssynchrony by tissue Doppler study May 2006.  3.  Class III congestive heart failure despite maximum medical therapy.  4.  Chronotropic incompetence by cardiopulmonary stress study.  5.  Incomplete right bundle branch block, first degree atrioventricular      block.  6.  Dyspnea at 25-50 feet walking and four-pillow orthopnea.   SECONDARY DIAGNOSES:  1.  Hypertension.  2.  Dyslipidemia.  3.  Type 2 diabetes since 1970s.  4.  Gout.  5.  GERD.   PROCEDURE:  June 10, 2005, implantation of Medtronic cardioverter-  defibrillator with defibrillator threshold study, Doylene Canning. Ladona Ridgel, M.D.,  practitioner.  The patient has had no complaints post implantation.  The  device has been interrogated with all values within normal limits.  Chest x-  ray shows the leads are in appropriate position and no pneumothorax.   DISCHARGE DISPOSITION:  [name] discharging day #1 status post  implantation of bi-V ICD.  The patient has had no complaints, as mentioned  above.  Chest x-ray looks good.  The device has been interrogated, all  values within normal limits.  He has been afebrile.  He is in sinus rhythm  with persistent bi-V pacing.  He discharges with the following instructions:   1.  He  is to keep incision dry for the next seven days.  Sponge bathe until      Monday, September 4.  2.  His discharge diet is a low-sodium, low-cholesterol diabetic diet.  3.  He is asked not to drive for the next week, not to lift any heavy      weights or objects for the next two weeks.  4.  Pain management:  Tylenol 325 mg one to two tablets every four to six      hours.   Medications on discharge are:  1.  Piroxicam 20 mg daily.  2.  Acarbose 50 mg three times daily.  3.  Furosemide 80 mg daily.  4.  Spironolactone 20 mg daily.  5.  Terazosin 2 mg daily.  6.  Glipizide 10 mg twice daily.  7.  Enteric-coated aspirin 325 mg daily.  8.  Potassium chloride 10 mEq daily.  9.  Benazepril 20 mg daily.  10. Coreg 25 mg twice daily.   Follow-up at [facility], [address]  [address]:  1.  ICD clinic Wednesday, September 6, at 10:45 in the morning.  2.  Then he will see Dr. Ladona Ridgel in November at his office.  Dr. Lubertha Basque      office will call with that appointment.   BRIEF HISTORY:  [name] is a 70 year old [place] veteran.  He has a  history of nonischemic cardiomyopathy and severe left ventricular  dysfunction.  He has class III congestive heart failure symptoms, and his  hypertension is difficult to control.  He has a history of obesity but has  recently lost 100 pounds.  Because of recent heart failure symptoms, the  patient underwent tissue Doppler echocardiogram in May 2006.  The study  showed significant dyssynchrony.  There was also mild to moderate mitral  regurgitation.  Ejection fraction at that time was 25%.  The patient has  dyspnea with exertion, walks 25-50 feet before getting short of breath.  He  has weakness and fatigue.  A cardiopulmonary stress study shows that he also  has chronotropic incompetence.  He is now referred for implantable  cardioverter-defibrillator with left ventricular lead placement.   HOSPITAL COURSE:  The patient presents electively  June 10, 2005.  He  underwent successful implantation of Medtronic [facility] Maximo cardioverter-  defibrillator with bi-V capability.  The patient is bi-V pacing at discharge  and has had no complications in the postprocedure period.  Mobility of the  left arm has been discussed with the patient as well as his follow-up  appointments.      Maple Mirza, P.A.    ______________________________  Doylene Canning. Ladona Ridgel, M.D.    GM/MEDQ  D:  06/11/2005  T:  06/11/2005  Job:  [id number]   cc:   Thresa Ross, M.D.  [facility]   Arvilla Meres, M.D. [facility]  [facility]  [address]  [place]  [place] [address]

## 2011-03-01 NOTE — Assessment & Plan Note (Signed)
[facility]                   COUMADIN / CHRONIC HEART FAILURE CLINIC NOTE   IZEA, LIVOLSI                      MRN:          [id number]  DATE:07/15/2006                            DOB:          21-Jun-1941    [name] returns today, to the Heart Failure Clinic for further  evaluation and medication titration of his congestive heart failure  secondary to nonischemic cardiomyopathy with a most recent echocardiogram  showing an improvement in the ejection fraction from 30-35% to 45-55%.  Mr.  [name] states that she has been doing well.  Apparently he was here,  earlier in September and saw [name].  Some minor adjustments were made  in his medication at that time.  Apparently the patient's blood pressure was  100/80 and on a recheck with Doppler it was actually 84/56 with a heart rate  of 60.  [name] had the patient hold Lasix for 2 days and then decrease that  dose to 40 mg b.i.d.  The patient was also instructed to decrease his Coreg  from 50 mg b.i.d. to 25 mg b.i.d. with instructions to return to the clinic  for blood pressure check in 1 week.  The patient returned to the clinic on  07/01/2006.  Blood pressure by Doppler, at that time, 122/74 with a heart  rate of 60.  The patient states he was feeling better.  [name] was  notified and the patient was instructed to continue the current medications.   He returns today for followup visit.  He states that he is doing quite well.  No episodes of presyncope or syncope, lightheadedness or dizziness.  No  orthopnea, PND.  No chest discomfort; and states that he is tolerating his  medications without any problems.   PAST MEDICAL HISTORY INCLUDES:  1. Congestive heart failure secondary to nonischemic cardiomyopathy with      an EF currently of 45-55%, status post biventricular ICD placement.  2. Morbid obesity.  3. Hypertension.  4. Hyperlipidemia.  5. Diabetes.  6. Polycythemia vera  requiring phlebotomy and treatment with hydroxyurea      on a chronic basis.  The patient is followed at the [place].  7. History of wandering atrial pacemaker.   CURRENT MEDICATIONS INCLUDE:  1. Acarbose 25 mg t.i.d.  2. Insulin as directed.  3. Coreg 25 mg b.i.d.  4. Lasix 40 mg b.i.d.  5. Aspirin 81 mg daily.  6. K-Dur 10 mEq daily.  7. Prednisone acetate eye drops q.i.d.   REVIEW OF SYSTEMS:  As stated above in the history of present illness  otherwise negative.   PHYSICAL EXAMINATION:  Weight 303 pounds.  The patient's weight is up 8  pounds from previous weight documented on 06/24/2006.  Blood pressure 143/88  with a pulse of 64.  Note:  Patient states that he has not taken any of his  medications yet today.  [name] is in no acute distress.  No jugular vein distention at 45-degree angle.  LUNGS:  Clear to auscultation bilaterally.  CARDIOVASCULAR EXAM:  Reveals S1-S2 within normal limits.  ABDOMEN:  Soft and nontender, positive bowel sounds,  morbidly obese.  Lower extremities without clubbing, cyanosis, or edema.   IMPRESSION:  1. Congestive heart failure secondary to nonischemic cardiomyopathy with      an EF that has improved to 45-55%.  However, the patient is mildly      hypertensive today; however, he has not taken any of his medications      yet this morning; and I hesitate to increase any of his medications      yet; as we have had problems with hypotension recently.  I have asked      the patient to take his medications before he comes to see me next      visit, at least an hour before, and to continue weighing daily at home.      His weight is up 8 pounds today.  The patient states that he is eating      more; however, it is difficult to evaluate secondary to the patient's      increased abdominal girth.  I am going to have him continue his      diuretic at the current dose; if the patient's weight increases, I am      going to have him call me and let me know of  any changes and I will see      patient back in 3 weeks.            ______________________________  Dorian Pod, ACNP    MB/MedQ  DD:  07/15/2006  DT:  07/16/2006  Job #:  [id number]

## 2011-03-01 NOTE — Assessment & Plan Note (Signed)
[facility]                   COUMADIN / CHRONIC HEART FAILURE CLINIC NOTE   KENYAN, KARNES                      MRN:          [id number]  DATE:06/24/2006                            DOB:          1941/09/11    The patient seen back in the Heart Failure Clinic for further evaluation and  medication titration associated with his documented coronary disease.  He  has had a recent echocardiogram which revealed improved ejection fraction of  45-55%.  The patient has been feeling well.  He has been having some  dizziness.  He has been having some weakness.  He is planning a trip to  [place] with his daughter who is beginning school.   PAST MEDICAL HISTORY/PROBLEM LIST:  1. 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  2. Morbid obesity.  3. Hypertension.  4. Hyperlipidemia.  5. Diabetes.  6. 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  Followed at the [place].  7. History of wandering atrial pacemaker.   CURRENT MEDICATIONS:  1. Piroxicam 20 mg daily.  2. Acarbose 25 mg t.i.d.  3. Furosemide 80 mg b.i.d.  4. Terazosin 2 mg daily at bedtime.  5. Glipizide 10 mg twice daily.  6. Hydroxyurea 500 mg twice daily.  7. Allopurinol 300 mg daily.  8. Insulin as directed most recent noted in January to be 12 units each      morning and 18 units in the evening, although this was not updated on      med list.  9. Benazepril 20 mg daily.  10.Potassium chloride 10 mEq twice daily.  11.Coreg 50 mg twice daily.  12.Aspirin 81 mg twice daily.  13.Prednisone acetate eye drops 4 drops daily in divided doses.  14.Ketorolac eye drops twice daily in each eye.   DRUG ALLERGIES:  None are known.   REVIEW OF SYSTEMS:  As stated in HPI otherwise negative.   PHYSICAL EXAMINATION:  VITAL SIGNS:  Weight today in the office is 295  pounds.   Blood pressure is 100/80.  On a recheck with Doppler, it is  actually 84/56.  Heart rate is 60.  The patient denies defibrillator fires.  HEENT:  Pupils are equal and round.  They react to light.  Fundi are not  visualized.  NECK:  Large and supple revealing no JVD at 45 degrees.  Waveform is within  normal limits.  No thyromegaly is appreciated.  Trachea is midline.  CHEST:  Unremarkable with well-healed device site.  LUNGS:  Clear to auscultation without rales or wheezes in all lung fields  bilaterally.  CARDIOVASCULAR:  S1, S2 within normal limits.  No S3, no S4, no murmurs.  PMI is not displaced or sustained.  EXTREMITIES:  No significant edema.  No cyanosis.  No clubbing.  NEURO:  The patient is alert and oriented to person, place, and time.  Answers questions appropriately.  Displays his normal humorous affect and is  participating in the visit today.   ASSESSMENT:  The patient has seen improvement  in his overall ejection  fraction; however, he has seen reduction in his blood pressures, and this  may be resulting in some of the dizziness that he is experiencing.  Of note,  he did not discontinue his piroxicam and reduced his aspirin at his last  visit.  These medication changes have been brought back to his attention.   PLAN:  1. The patient will dc his piroxicam.  2. The patient will hold his Lasix for 2 days and then decrease that dose      to 40 mg twice daily.  3. The patient will decrease his Coreg to __________  mg twice daily.  The      patient will return to the clinic for blood pressure check in 1 week.      At that time, further medication changes may be indicated.  We will      obtain a CMET today as well as a white blood cell count and CBC to      verify no significant issues with therapy.   Time spent with the patient is 45 minutes.      ______________________________  [name], PharmD, BCPS    ______________________________  Bevelyn Buckles. Bensimhon, MD    MP/MedQ  DD:  07/15/2006  DT:  07/15/2006  Job #:  [id number]

## 2011-03-01 NOTE — Assessment & Plan Note (Signed)
[facility]                          CHRONIC HEART FAILURE NOTE   [name], [name]                      MRN:          [id number]  DATE:09/19/2006                            DOB:          Mar 16, 1941    Primary cardiologist is Dr. Nicholes Mango, EP Dr. Lewayne Bunting, and  primary care is Dr. Thresa Ross at the [facility].  [name]  returns today for evaluation and medication titration of his congestive  heart failure, which is secondary to nonischemic cardiomyopathy.  [name] has an ejection fraction that initially was recorded as 30% to  35% by echocardiogram, most recently 45% to 55%.  [name] states he  has been doing well.  He is trying very diligently to lose weight.  Other than that, he denies any symptoms suggestive of volume overload,  no episodes of chest discomfort, no firing from his defibrillator.  He  is mildly hypertensive today, but he states he has not taken any of his  blood pressures yet this morning.  His CBGs are running 130 to 140s.  Otherwise no problems.   PAST MEDICAL HISTORY:  1. Congestive heart failure secondary to nonischemic cardiomyopathy      with an EF currently 45% to 55%, status post biventricular ICD      placement.  2. Morbid obesity.  3. Hypertension.  4. Hyperlipidemia.  5. Diabetes.  6. 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  [facility].  7. History of wandering atrial pacemaker.   REVIEW OF SYSTEMS:  As stated above, otherwise negative.   CURRENT MEDICATIONS:  1. Piroxicam 20.  2. Acarbose 25 mg t.i.d.  3. Furosemide 60 mg b.i.d.  4. Aspirin 81.  5. Benazepril 20 mg daily.  6. Coreg 25 mg b.i.d.  7. Insulin as directed.  8. Potassium 10 mEq daily.  9. Allopurinol 300 mg daily.  10.Glipizide 10 mg b.i.d.  11.Spironolactone 25 mg daily.   PHYSICAL EXAMINATION:  Weight today is 303 pounds,  he is up 5 pounds  from October, blood pressure 140/87 with a pulse of 68.  [name] is in no acute distress.  He is very pleasant and  cooperative as always.  No jugular vein distention at 45 degree angle.  LUNGS:  Clear to auscultation.  CARDIOVASCULAR:  Exam reveals a regular rate and rhythm, S1 and S2,  within normal limits.  ABDOMEN:  Very protuberant, soft, nontender, positive bowel sounds.  LOWER EXTREMITIES:  Without clubbing, cyanosis or edema.   IMPRESSION:  Class II heart failure.  Continue current medications.  I  am going to check lab work today on the patient, as he has not had any  done in several months.  We will see the patient back in 2 to 3 months,  sooner if he has any problems.      Dorian Pod, ACNP  Electronically Signed      Bevelyn Buckles. Bensimhon, MD  Electronically Signed   MB/MedQ  DD:  09/19/2006  DT: 09/20/2006  Job #: [id number]

## 2011-03-01 NOTE — Assessment & Plan Note (Signed)
[facility]                          CHRONIC HEART FAILURE NOTE   NAME:[name] Leon, [name]                      MRN:          [id number]  DATE:11/17/2006                            DOB:          01/17/1941    SUBJECTIVE:  [name] Leon returns today for further followup regarding  his congestive heart failure which is secondary to non-ischemic  cardiomyopathy.  His primary cardiologist is Dr. Bevelyn Buckles. Bensimhon.  His electrophysiologist is Dr. Doylene Canning. Ladona Ridgel.  His primary care is Dr.  Thresa Ross at the [facility].  [name] Leon states he  has been doing quite well.  He denies any new symptoms suggestive of  increased volume overload.  His weight is up today but his wife states  he has been eating much more since I saw him last, and he is not  exercising at all.  She appears to be very concerned about his increased  weight.  The patient, however, just laughs and shrugs and says he will  do something about it eventually.  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  The patient states his blood sugars have been running good.  He states  that the most active he has been is just walking from the parking lot to  the stores recently.  He does not appear to be too concerned about his  increased weight gain.   PAST MEDICAL HISTORY:  1. Congestive heart failure, secondary to non-ischemic cardiomyopathy      with an ejection fraction currently of 45%-55%, status post bi-      ventricular ICD placement.  2. Morbid obesity.  3. Hypertension.  4. Hyperlipidemia.  5. Diabetes.  6. 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  The patient is [facility] by the      [facility] for [facility].  7. History of wandering atrial pacemaker.  8. Gout.  9. Gastroesophageal reflux disease.   REVIEW OF SYSTEMS:  As stated above.   CURRENT MEDICATIONS:  1. Piroxicam 20 mg daily.  2. __________ 25 mg t.i.d.  3.  Furosemide 80 mg b.i.d.  4. Aspirin 81 mg.  5. Benazepril 20 mg daily.  6. Coreg 25 mg b.i.d.  7. Insulin.  8. K-Dur 10 mEq daily.  9. Allopurinol 300 mg daily.  10.Glipizide 10 mg, two tab b.i.d.  11.Spironolactone 25 mg daily.  The patient is not sure whether or not      he is taking this.  He is going to check his medication bottles      when he gets home and let me know.  12.Hydroxyurea 500 mg t.i.d.  13.Simvastatin 20 mg daily.   PHYSICAL EXAMINATION:  VITAL SIGNS:  Weight today 305 pounds.  One year  ago the patient weighed 282 pounds.  He has gradually increased his  weight over the last year, but at no time has had a sudden increase in  weight acutely.  Blood pressure 112/71, pulse 66.  GENERAL:  [name] Leon is in no acute distress.  NECK:  Jugular venous distention is 6  to 7 cm at a 45-degree angle.  LUNGS:  Clear to auscultation.  CARDIOVASCULAR:  Exam reveals an S1 and S2.  Occasional early beat.  ABDOMEN:  Soft, nontender, with positive bowel sounds, protuberant.  EXTREMITIES:  The lower extremities without clubbing, cyanosis or edema.   IMPRESSION:  Stable heart failure at this time, with extreme  deconditioning.  The ejection fraction currently is 45%-55%.   PLAN:  I have discussed at length with the patient the importance of  exercise and weight reduction, in regards to his heart failure and  diabetes.  He states that he will continue to work on it.  Will  continue the  current medications.  The patient is to call me back and let me know  whether or not he is taking the spironolactone, as we will need to check  his laboratory work on him.   I am going to have the patient follow up with Dr. Bevelyn Buckles. Bensimhon  for a routine cardiology visit.  Will schedule this next appointment.      Dorian Pod, ACNP  Electronically Signed      Rollene Rotunda, MD, [facility]  Electronically Signed   MB/MedQ  DD: 11/17/2006  DT: 11/17/2006  Job #: [id number]   cc:   Thresa Ross, M.D.

## 2011-03-01 NOTE — Procedures (Signed)
[name], [name]               ACCOUNT NO.:  [id number]   MEDICAL RECORD NO.:  [id number]          PATIENT TYPE:  OUT   LOCATION:  SLEEP CENTER                 FACILITY:  [facility]   PHYSICIAN:  Clinton D. Maple Hudson, M.D. DATE OF BIRTH:  1941/01/26   DATE OF STUDY:  03/28/2005                              NOCTURNAL POLYSOMNOGRAM   PROCEDURE:  Nocturnal polysomnogram.   REFERRING PHYSICIAN:  Arvilla Meres, MD   INDICATIONS FOR PROCEDURE:  Hypersomnia with sleep apnea.   DATA:  Epworth sleepiness score 2/24.  BMI 33.  Weight 270 pounds.   SLEEP ARCHITECTURE:  Very short total sleep time of 181 minutes with sleep  efficiency 40%.  Stage I was 21%, stage II 28%, stage III and IV 40%.  REM  12% of total sleep time.  Sleep latency 83 minutes.  REM latency 227  minutes.  Awake after sleep onset 184 minutes.  Arousal index increased to  39.  The patient took routine home medications, but no sleep medication or  sedative taken at bedtime.   RESPIRATORY DATA:  Respiratory disturbance index (RDI), 23.1 obstructive  events per hour, indicating moderate obstructive sleep apnea/hypopnea  syndrome.  There were 42 obstructive apneas and 28 hypopneas.  The events  were not positional but therefore most events were recorded while on the  left side.  REM RDI was 36.  Split protocol for CPAP titration could not be  used on this night, because of insufficient sleep with sustained sleep onset  not noted until nearly 2:30 a.m.   OXYGEN DATA:  Moderate snoring with oxygen desaturation to a Nadir of 82%.  Mean oxygen saturation through the study was 93% on room air.   CARDIAC DATA:  Rhythm appears to be intermittently paced with a variable  heart rate of 45-64 beats per minute.   MOVEMENT/PARASOMNIA:  Occasional leg jerk with insignificant effect on  sleep.   IMPRESSION/RECOMMENDATION:  1.  Moderate obstructive sleep apnea/hypopnea syndrome, respiratory distress      index 23.1 per hour with  moderate snoring and oxygen desaturation to      82%.  2.  Consider return for CPAP titration, bringing a sleep medication if      appropriate.  3.  Irregular cardiac rhythm, difficult to assess on a single lead.      Clinton D. Maple Hudson, M.D.  Diplomat    CDY/MEDQ  D:  03/31/2005 12:33:48  T:  04/01/2005 15:57:46  Job:  [id number]   cc:   Melvyn Novas, M.D.  [address]  [address]  [place]  [place] [address]  Fax: [phone]

## 2011-03-01 NOTE — Op Note (Signed)
[name], [name]                         ACCOUNT NO.:  [id number]   MEDICAL RECORD NO.:  [id number]                   PATIENT TYPE:  INP   LOCATION:  0358                                 FACILITY:  [facility]   PHYSICIAN:  [name], M.D.          DATE OF BIRTH:  1941-09-20   DATE OF PROCEDURE:  07/08/2002  DATE OF DISCHARGE:                                 OPERATIVE REPORT   PREOPERATIVE DIAGNOSIS:  Expanding hematoma, status post removal of lipoma,  right anterior lateral chest at the [place] earlier today.  Unable to control  bleeding the emergency room with local anesthesia.   POSTOPERATIVE DIAGNOSIS:  Expanding hematoma, status post removal of lipoma,  right anterior lateral chest at the [place] earlier today.  Unable to control  bleeding the emergency room with local anesthesia.   PROCEDURE:  Exploration of expanding hematoma and suture of bleeding and  reclosure of wound, lateral anterior chest wall.   SURGEON:  [name], M.D.   ANESTHESIA:  General anesthesia.   INDICATIONS:  The patient is a 70 year old male victim of high blood  pressure and coronary artery disease, history of congestive heart failure  who is followed by the [place] in [place] and today in the clinic had a 4  cm lipoma removed from the anterior right lateral chest, and he said shortly  afterwards he started noticing swelling in the area.  He called the clinic,  and they said to put pressure on it and if it continued to increase for him  to see his local ER.  He continued to have an enlargement of this and  presented to the ER.  He had an enormous hematoma on the right anterior  lateral chest, probably a good unit of blood in the subcutaneous tissue.  I  was in the ER seeing another patient and was asked to see him.  I opened his  incision and evacuated this enormous hematoma and with local anesthesia  could never control the bleeding.  It appeared that they had placed a lot of  sutures  in the lateral aspect, and they had gotten in a very deep plain  right on top of the pectoral muscle and really unable to control it with  local anesthesia I recommended we take the patient to the OR with general  anesthesia.  The patient has a definite know cardiac disease, angina  intermittently but has not had any today but always a problem with  congestive heart failure.  He understands the risks.   DESCRIPTION OF PROCEDURE:  The patient was taken to the anesthesia general  OR.  General anesthesia was administered.  We gave him 1 g of Kefzol  preoperatively, and then the packing that I had placed in the wound in the  ER was removed and again probably another 300-400 cc since of blood since it  had been in the ER about 45 minutes earlier.  I then basically extended the  incision both medially and laterally slightly to get a little better  exposure and a Gelpi retractor in the subcutaneous tissue and then  retraction. There were a lot of sutures that had been placed laterally.  We  just basically opened and removed all of this.  About three little bleeding  areas were actually identified.  These were sutured with 4-0 Vicryl and then  thoroughly irrigated.  There were little probably not the source of the  bleeding that were controlled with cautery and sutures and then we  thoroughly irrigated.  I then used peroxide to kind of irrigate this with  saline to get out the old hematoma in the subcutaneous tissue, etc.  I then  reinspected the wound thoroughly in all areas.  It is a fairly good sized  wound, and then I actually closed the Scarpa's fascia layer with interrupted  4-0 Vicryl.  A 10 Blake Jackson-Pratt drain had been placed in the deep  area, brought out laterally inferior and then the skin was closed with  staples.  He appears to be having only a little bit of serous drainage from  the drain at this time.   DISPOSITION:  I am going to keep him overnight, give him another 1 g of   Kefzol and then inspect.  We will change the dressing in the morning, and if  he is not having any further problems with bleeding I think he can be  released, and we will follow him back in our office for removal of the drain  in probably four to five days.  I am going to keep him on p.o. Keflex since  this is a complex problem that had a prolonged OR and in the clinic earlier  today and then reopened again, semi-sterile wound and exploration.  We will  continue him on his chronic medications and check an Accu-Chek in the  recovery room.  He is a diabetic on oral medications.                                               [name], M.D.    WJW/MEDQ  D:  07/08/2002  T:  07/08/2002  Job:  [id number]

## 2011-03-01 NOTE — Assessment & Plan Note (Signed)
[name]                          CHRONIC HEART FAILURE NOTE   [name], [name]                      MRN:          [id number]  DATE:01/20/2007                            DOB:          [birth year]    [name] returns today for followup regarding his congestive heart  failure which is secondary to nonischemic cardiomyopathy. The primary  cardiologist is Dr. [name], EP is Dr. Sharrell Ku, primary care is  Dr. Ricard Dillon at the [facility].   [name] states he has been doing quite well. He has tried to  increase his activity level as we discussed this at his last visit. His  wife accompanies him today. She is concerned about the abdominal  fullness that he has. [name] states compliance with his diuretic;  however, abdominal girth fluctuates on a day to day basis. Upon further  discussion and evaluation, it appears [name] is consuming more  sodium than he should be. In reviewing his diet, he eats pickles, he  also has been eating [place] food (chow mein). He noticed that his  abdominal fullness/bloating seems to correlate with what he eats. He  denies any orthopnea or PND, no dyspnea on exertion, no chest  discomfort.   PAST MEDICAL HISTORY:  1. Congestive heart failure secondary to nonischemic cardiomyopathy      with EF currently 55% by echocardiogram.  2. Status post EP evaluation with placement of a Medtronic Maximo 7304      biventricular device implanted June 10, 2005.  3. Status post cardiac catheterization. In 1997, the patient underwent      a right and left heart cath at the [facility]      by Dr. Nicki Guadalajara and was found to have nonischemic dilated      cardiomyopathy with normal coronary arteries. Also no evidence for      renal artery stenosis.  4. Type 2 diabetes.  5. Remote history of alcohol and tobacco use.  6. Longstanding history of hypertension.  7. Status post stress Myoview  in 2006 with an abnormal perfusion scan.      No further workup at that time.  8. Chronotropic incompetence by cardiopulmonary stress study.  9. 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  10.History of GERD.  11.History of gout.  12.History of dyslipidemia.  13.History of exertional dizziness status post 24-hour Holter monitor      in 2006 showing frequent premature atrial contractions.  14.Morbid obesity.  15.History of polycythemia vera on hydroxyurea therapy followed by the      [facility].  16.History of wandering atrial pacemaker.   REVIEW OF SYSTEMS:  As stated above.   CURRENT MEDICATIONS:  1. Piroxicam 20 mg daily.  2. Acarbose 25 mg t.i.d.  3. Furosemide 80 mg b.i.d.  4. Aspirin 81 mg daily.  5. Benazepril 15 mg daily.  6. Coreg 25 mg b.i.d.  7. Insulin as directed.  8. Kay Ciel 10 mEq daily.  9. Allopurinol 300 mg daily.  10.Glipizide 20  mg b.i.d.  11.Hydroxyurea 500 mg t.i.d.  12.Simvastatin 20 mg daily.   PHYSICAL EXAMINATION:  VITAL SIGNS:  Weight 294 pounds. Weight is down 9  pounds from February. Blood pressure 112/67 with a pulse of 65.  GENERAL:  [name] is in no acute distress. He is his usual pleasant  self.  NECK:  No jugular vein distention at a 45-degree angle.  LUNGS:  Clear to auscultation.  CARDIOVASCULAR:  Reveals an S1 and S2. Regular rate and rhythm. Patient  with a 1-2/6 systolic ejection murmur.  ABDOMEN:  Obese, nontender, positive bowel sounds. There is no  hepatosplenomegaly. He is slightly distended.  EXTREMITIES:  Warm without clubbing, cyanosis or edema.  NEUROLOGIC:  The patient is alert and oriented x3. Cranial nerves II-XII  grossly intact.   IMPRESSION:  1. Stable chronic heart failure at this time.  2. The patient does have some mild abdominal distention.   PLAN:  1. Will check lab work today including BNP, CMET and hepatic panel.      The patient has not eaten so will also go  ahead and check lipids.  2. Reinforced sodium restricted diet with patient and encouraged      increased activity level.  3. Will continue current medications with the patient's blood pressure      well controlled.  4. I am going to have him follow up with Dr. [name] for a routine      cardiology visit next month and then I will see him back after      that.     Dorian Pod, ACNP  Electronically Signed      Rollene Rotunda, MD, [facility]  Electronically Signed   MB/MedQ  DD: 01/20/2007  DT: 01/20/2007  Job #: [id number]

## 2011-03-01 NOTE — Assessment & Plan Note (Signed)
[facility]                   COUMADIN / CHRONIC HEART FAILURE CLINIC NOTE   [name], [name]                      MRN:          [id number]  DATE:08/12/2006                            DOB:          12/14/1940    REASON FOR VISIT:  [name] returns today to the Heart Failure Clinic for  further evaluation and medication titration for his congestive heart failure  which is secondary to nonischemic cardiomyopathy.  [name] most recent  echocardiogram showed an improvement in the ejection fraction from 30 to 35%  to 45 to 55%.  [name] states he has been doing quite well.  His wife is  in attendance with him today.  They just returned from a trip to [place],  [place] to visit their daughter.  [name] states he did quite well on  the trip, some fatigue at the end of the day, otherwise no shortness of  breath, dyspnea on exertion, presyncope, syncopal episodes, orthopnea or  PND.  No firing from his defibrillator.  His wife states they have really  been working on diet and decrease in the amount of sodium the patient  consumes and his weight today is down 5 pounds.  [name] appears to be  very sensitive to some of his medication.  In the past we had him on Coreg  50 mg b.i.d., however, [name] experiences hypotension and sinus brady  and has been maintained on 25 mg b.i.d.  The patient states he feels much  better at this dose.   PAST MEDICAL HISTORY:  1. Congestive heart failure secondary to nonischemic cardiomyopathy with      ejection fraction currently 45 to 55%, status post biventricular ICD      placement.  2. Morbid obesity.  3. Hypertension.  4. Hyperlipidemia.  5. Diabetes.  6. 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  [facility]  7. History of wandering atrial pacemaker.   CURRENT MEDICATIONS:  1. Piroxicam 20 mg daily.  2. Benazepril 20  mg daily.  3. Lasix 40 mg b.i.d.  4. Aspirin 81 mg daily.  5. Metolazone 2.5 mg daily (prescribed by [facility]).  6. Coreg 25 mg b.i.d.  7. Insulin as directed.  8. Potassium 10 mEq daily.  9. Allopurinol 300 mg daily.  10.Glipizide 10 mg b.i.d.  11.Terazosin 2 mg daily.  12.Spironolactone 25 mg daily.  13.Acarbose 25 mg t.i.d.   REVIEW OF SYSTEMS:  As stated above in history of present illness, otherwise  negative.   PHYSICAL EXAMINATION:  VITAL SIGNS:  Weight 298.  Blood pressure 96/61 with  pulse of 75.  GENERAL APPEARANCE:  [name] is in no acute distress.  NECK:  No jugular venous distention at 45 degree angle.  LUNGS:  Clear to auscultation bilaterally.  CARDIOVASCULAR:  Exam reveals an S1 and S2 within normal limits.  ABDOMEN:  Soft, nontender, positive bowel sounds, obese.  EXTREMITIES:  Lower extremities without cyanosis, clubbing or edema.   IMPRESSION:  [name] appears to be stable from a heart failure  perspective today.  I am going to discontinue his metolazone which was  prescribed by the [place] and increase his Lasix to 60 mg b.i.d. I will repeat  laboratory work next visit.  I have instructed [name] to continue his  other medications as prescribed.  Reinforced diet education and I will see  the patient back in six weeks.     ______________________________  Dorian Pod, ACNP    ______________________________  Bevelyn Buckles. Bensimhon, MD   MB/MedQ  DD: 08/12/2006  DT: 08/12/2006  Job #: [id number]

## 2011-03-01 NOTE — Assessment & Plan Note (Signed)
[facility]                          CHRONIC HEART FAILURE NOTE   MARCELLES, CLINARD                      MRN:          [id number]  DATE:09/19/2006                            DOB:          27-Sep-1941    Primary cardiologist, Dr. Nicholes Mango. Primary care physician, the  patient is followed by Dr. Renae Fickle Daily, with the [facility]. EP is Dr. Ladona Ridgel.   Mr. Seltzer returns today for further evaluation of medication titration  of his congestive heart failure, which is secondary to nonischemic  cardiomyopathy. Mr. Deren has a known ejection fraction    INCOMPLETE DICTATION      Dorian Pod, ACNP       Jesse Sans. Daleen Squibb, MD, [facility]    MB/MedQ  DD: 09/19/2006  DT: 09/20/2006  Job #: [id number]

## 2011-03-04 ENCOUNTER — Other Ambulatory Visit: Payer: Self-pay

## 2011-03-04 MED ORDER — WARFARIN SODIUM 6 MG PO TABS: ORAL_TABLET | ORAL | Status: DC

## 2011-03-28 ENCOUNTER — Ambulatory Visit (INDEPENDENT_AMBULATORY_CARE_PROVIDER_SITE_OTHER): Payer: Medicare Other | Admitting: *Deleted

## 2011-03-28 DIAGNOSIS — I4891 Unspecified atrial fibrillation: Secondary | ICD-10-CM

## 2011-03-28 LAB — POCT INR: INR: 2.3

## 2011-04-18 ENCOUNTER — Other Ambulatory Visit: Payer: Self-pay

## 2011-04-18 MED ORDER — SIMVASTATIN 20 MG PO TABS: 20.0000 mg | ORAL_TABLET | Freq: Every day | ORAL | Status: DC

## 2011-04-25 ENCOUNTER — Ambulatory Visit (INDEPENDENT_AMBULATORY_CARE_PROVIDER_SITE_OTHER): Payer: Medicare Other | Admitting: *Deleted

## 2011-04-25 DIAGNOSIS — I4891 Unspecified atrial fibrillation: Secondary | ICD-10-CM

## 2011-04-25 LAB — POCT INR: INR: 2.7

## 2011-05-04 ENCOUNTER — Other Ambulatory Visit: Payer: Self-pay | Admitting: Internal Medicine

## 2011-05-06 ENCOUNTER — Telehealth: Payer: Self-pay | Admitting: Internal Medicine

## 2011-05-06 NOTE — Telephone Encounter (Signed)
Imdur was d/c'd at last OV when Viagra was ordered pharmacy is aware

## 2011-05-06 NOTE — Telephone Encounter (Signed)
[facility] [address],  calling re possible drug interaction between viagra and isosorbide

## 2011-05-08 ENCOUNTER — Other Ambulatory Visit: Payer: Self-pay | Admitting: *Deleted

## 2011-05-08 MED ORDER — FUROSEMIDE 20 MG PO TABS: 40.0000 mg | ORAL_TABLET | Freq: Two times a day (BID) | ORAL | Status: DC

## 2011-05-13 ENCOUNTER — Encounter: Payer: Self-pay | Admitting: Internal Medicine

## 2011-05-13 ENCOUNTER — Ambulatory Visit (INDEPENDENT_AMBULATORY_CARE_PROVIDER_SITE_OTHER): Payer: Medicare Other | Admitting: Internal Medicine

## 2011-05-13 VITALS — BP 84/62 | HR 76 | Resp 14 | Ht 75.0 in | Wt 262.0 lb

## 2011-05-13 DIAGNOSIS — I5022 Chronic systolic (congestive) heart failure: Secondary | ICD-10-CM

## 2011-05-13 DIAGNOSIS — I4891 Unspecified atrial fibrillation: Secondary | ICD-10-CM

## 2011-05-13 LAB — BASIC METABOLIC PANEL WITH GFR
BUN: 17 mg/dL (ref 6–23)
CO2: 32 meq/L (ref 19–32)
Calcium: 9 mg/dL (ref 8.4–10.5)
Chloride: 102 meq/L (ref 96–112)
Creatinine, Ser: 1.2 mg/dL (ref 0.4–1.5)
GFR: 75.31 mL/min
Glucose, Bld: 175 mg/dL — ABNORMAL HIGH (ref 70–99)
Potassium: 4.3 meq/L (ref 3.5–5.1)
Sodium: 142 meq/L (ref 135–145)

## 2011-05-13 NOTE — Assessment & Plan Note (Signed)
Doing well. Relatively asymptomatic from HF perspective though very limited due to hip pain. Volume status looks good. Weight very stable. BP soft but asymptomatic. Will continue current meds. Check labs today. If gets dizzy or renal function worse can cut back lasix.

## 2011-05-13 NOTE — Assessment & Plan Note (Signed)
Chronic. Tolerating coumadin. Ok to hold coumadin as needed for hip injections.

## 2011-05-13 NOTE — Progress Notes (Signed)
HPI:   [name] is a 70 yo male with a h/o  DCM, CHF, HTN, atrial arrythmias (now chronic AF) and obesity.  He initially had an EF of 20-25% and single chamber ICD was implanted. EF then recovered to 50% (echo 2008) and had ICD explanted and BiV pacer placed due to bradycardia.  Had routine follow-up echo in 08/09/10 and showed EF back down to 25-30%. Medcial therapy intensified but f/u echo in 12/06/10 showed worsening EF of 20-25%. So patient underwent R&L cath on 12/21/10  Cath results: 1) Mod nonobs CAD (mLAD 70%, m-dLAD 40%; oDx 60%; mCFX 30-40%; pRCA 30%)  2) RA 21  RV 81/16/24  PA 81/36 (56) PCWP 33 (v to 43) PA sat 43% 48%, Fick 4.2/1.6  After cath admitted for diuresis and titration of hydralazine/nitrates.  Returns for f/u. Doing very well. Weight now stable at 255-260. Weighing every day. Breathing much better. No CP, orthopnea, PND, LE edema. At last visit hydralazine increased to 50 tid. BP improved. No dizziness. Only complaint is severe pain in L hip. Barely able to walk. Seeing ortho. No bleeding with coumadin. Wants to stop Imdur so he can restart sildenafil.   ROS: All systems negative except as listed in HPI, PMH and Problem List.  Past Medical History  Diagnosis Date  . Obesities, morbid   . Hypertension   . Hyperlipidemia   . Diabetes mellitus   . Polycythemia     vera requiring phlebotomy and treatment with hydroxyurea on a chronic bais. [facility]  . CHF (congestive heart failure)     secondary to nonischemic cardiomyopathy. EF initially 20-25% up to 50% in 2008. Echo 2/12 EF 20-25% . RHC 3/12: RA 21  RV 81/16/24  PA 81/36 (56) PCWP 33 (v to 43) PA sat 43% 48%.status post Medtronic biventricular ICD placement.(with Sprint Fidelis leads)-explanted--now with [employer] An[employer] B[employer]   . 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 Atrial fibrillation     Chronic. On coumadin.  . Coronary artery disease, non-occlusive    Cath 3/12: mLAD 70%, m-dLAD 40%; Dx 60%; mCFX 30-40%; pRCA 30%)    Current Outpatient Prescriptions  Medication Sig Dispense Refill  . acarbose (PRECOSE) 50 MG tablet Take 50 mg by mouth. Take 1/2 tablet 3 times daily       . allopurinol (ZYLOPRIM) 300 MG tablet Take 300 mg by mouth daily.        Marland Kitchen aspirin 81 MG tablet Take 81 mg by mouth daily.        . benazepril (LOTENSIN) 20 MG tablet Take 10 mg by mouth daily.       . carvedilol (COREG) 25 MG tablet Take 25 mg by mouth 2 (two) times daily.        . digoxin (LANOXIN) 0.125 MG tablet Take 125 mcg by mouth daily.        . furosemide (LASIX) 20 MG tablet Take 2 tablets (40 mg total) by mouth 2 (two) times daily.  120 tablet  5  . glipiZIDE (GLUCOTROL) 10 MG tablet Take 10 mg by mouth 2 (two) times daily.        . Glucose Blood (BAYER BREEZE 2 TEST) DISK by In Vitro route. Test sugar 3 times a day       . hydrALAZINE (APRESOLINE) 50 MG tablet Take 1 tablet (50 mg total) by mouth 3 (three) times daily.  90 tablet  6  . HYDROcodone-acetaminophen (  VICODIN) 5-500 MG per tablet Take 2 tablets by mouth every 6 (six) hours as needed.        . hydroxyurea (HYDREA) 500 MG capsule Take 500 mg by mouth 3 (three) times daily. May take with food to minimize GI side effects.       . insulin NPH (HUMULIN N,NOVOLIN N) 100 UNIT/ML injection Inject into the skin. Use as directed       . metoCLOPramide (REGLAN) 10 MG tablet Take 10 mg by mouth every 6 (six) hours as needed.        . potassium chloride (KLOR-CON) 20 MEQ packet Take 20 mEq by mouth daily.        . simvastatin (ZOCOR) 20 MG tablet Take 1 tablet (20 mg total) by mouth at bedtime.  30 tablet  3  . terazosin (HYTRIN) 2 MG capsule Take 2 mg by mouth at bedtime.        Marland Kitchen warfarin (COUMADIN) 6 MG tablet Use daily as directed by anticoagulation clinic.  30 tablet  2     PHYSICAL EXAM: Filed Vitals:   05/13/11 1047  BP: 92/62  Pulse: 76  Resp: 14  General:  Well appearing. No resp  difficulty HEENT: normal Neck: supple. JVP flat. Carotids 2+ bilaterally; no bruits. No lymphadenopathy or thryomegaly appreciated. Cor: PMI laterally displaced. Regular rate & rhythm. No rubs, gallops or murmurs. Lungs: clear Abdomen: obese soft, nontender, nondistended. No hepatosplenomegaly. No bruits or masses. Good bowel sounds. Extremities: no cyanosis, clubbing, rash, edema Neuro: alert & orientedx3, cranial nerves grossly intact. Moves all 4 extremities. Affect pleasant.    ECG: Atrial fib with V-pacing 76 bpm   ASSESSMENT & PLAN:

## 2011-05-13 NOTE — Patient Instructions (Signed)
Labs today  Your physician recommends that you schedule a follow-up appointment in: 3 months  

## 2011-05-23 ENCOUNTER — Ambulatory Visit (INDEPENDENT_AMBULATORY_CARE_PROVIDER_SITE_OTHER): Payer: Medicare Other | Admitting: *Deleted

## 2011-05-23 DIAGNOSIS — I4891 Unspecified atrial fibrillation: Secondary | ICD-10-CM

## 2011-05-28 ENCOUNTER — Other Ambulatory Visit: Payer: Self-pay | Admitting: *Deleted

## 2011-05-28 ENCOUNTER — Other Ambulatory Visit: Payer: Self-pay

## 2011-05-29 ENCOUNTER — Other Ambulatory Visit: Payer: Self-pay | Admitting: *Deleted

## 2011-05-29 DIAGNOSIS — M48061 Spinal stenosis, lumbar region without neurogenic claudication: Secondary | ICD-10-CM

## 2011-05-30 ENCOUNTER — Telehealth: Payer: Self-pay | Admitting: Internal Medicine

## 2011-05-30 NOTE — Telephone Encounter (Signed)
Per pt call, pt said [facility] is trying to get in touch with Bensimhon and/or his nurse. Pt is getting CT monogram, and needs to stop warfarin. Please return pt call to advise/discuss.   [facility] is giving pt 10 days for everything to be complete.

## 2011-05-31 NOTE — Telephone Encounter (Signed)
Dr [name] said ok for pt to hold coumadin for lumbar myelogram, form was faxed back to [facility], pt's wife is aware

## 2011-06-06 ENCOUNTER — Ambulatory Visit (INDEPENDENT_AMBULATORY_CARE_PROVIDER_SITE_OTHER): Payer: Medicare Other | Admitting: *Deleted

## 2011-06-06 ENCOUNTER — Encounter: Payer: Medicare Other | Admitting: *Deleted

## 2011-06-06 DIAGNOSIS — I4891 Unspecified atrial fibrillation: Secondary | ICD-10-CM

## 2011-06-06 LAB — POCT INR: INR: 1.4

## 2011-06-07 ENCOUNTER — Ambulatory Visit
Admission: RE | Admit: 2011-06-07 | Discharge: 2011-06-07 | Disposition: A | Discharge status: Home / Self Care | Payer: PRIVATE HEALTH INSURANCE | Source: Ambulatory Visit | Attending: *Deleted | Admitting: *Deleted

## 2011-06-07 DIAGNOSIS — M48061 Spinal stenosis, lumbar region without neurogenic claudication: Secondary | ICD-10-CM

## 2011-06-07 DIAGNOSIS — M545 Low back pain: Secondary | ICD-10-CM

## 2011-06-07 DIAGNOSIS — M25552 Pain in left hip: Secondary | ICD-10-CM

## 2011-06-07 MED ORDER — IOHEXOL 180 MG/ML  SOLN
17.0000 mL | Freq: Once | INTRAMUSCULAR | Status: AC | PRN
  Administered 2011-06-07: 17 mL via INTRATHECAL

## 2011-06-07 MED ORDER — DIAZEPAM 2 MG PO TABS
5.0000 mg | ORAL_TABLET | Freq: Once | ORAL | Status: AC
  Administered 2011-06-07: 5 mg via ORAL

## 2011-06-07 MED ORDER — SODIUM CHLORIDE 0.9 % IV SOLN: 4.0000 mg | Freq: Four times a day (QID) | INTRAVENOUS | Status: DC | PRN

## 2011-06-07 NOTE — Progress Notes (Signed)
Patient without complaint of pain at present.  Friend at bedside.  Patient alert and oriented, MAEx4.

## 2011-06-14 ENCOUNTER — Ambulatory Visit (INDEPENDENT_AMBULATORY_CARE_PROVIDER_SITE_OTHER): Payer: Medicare Other | Admitting: *Deleted

## 2011-06-14 ENCOUNTER — Encounter: Payer: Self-pay | Admitting: *Deleted

## 2011-06-14 DIAGNOSIS — I4891 Unspecified atrial fibrillation: Secondary | ICD-10-CM

## 2011-06-14 LAB — POCT INR: INR: 1.7

## 2011-06-20 ENCOUNTER — Encounter: Payer: Medicare Other | Admitting: *Deleted

## 2011-06-24 ENCOUNTER — Encounter: Payer: Medicare Other | Admitting: *Deleted

## 2011-06-26 ENCOUNTER — Encounter: Payer: Self-pay | Admitting: Internal Medicine

## 2011-06-26 ENCOUNTER — Ambulatory Visit (INDEPENDENT_AMBULATORY_CARE_PROVIDER_SITE_OTHER): Payer: Medicare Other | Admitting: *Deleted

## 2011-06-26 DIAGNOSIS — I5022 Chronic systolic (congestive) heart failure: Secondary | ICD-10-CM

## 2011-06-26 DIAGNOSIS — Z7901 Long term (current) use of anticoagulants: Secondary | ICD-10-CM | POA: Insufficient documentation

## 2011-06-26 DIAGNOSIS — Z950 Presence of cardiac pacemaker: Secondary | ICD-10-CM

## 2011-06-26 DIAGNOSIS — I4891 Unspecified atrial fibrillation: Secondary | ICD-10-CM

## 2011-06-26 LAB — PACEMAKER DEVICE OBSERVATION
AL AMPLITUDE: 1.4 mv
BAMS-0001: 150 {beats}/min
BATTERY VOLTAGE: 2.9478 V
RV LEAD AMPLITUDE: 12 mv
RV LEAD IMPEDENCE PM: 450 Ohm

## 2011-06-26 NOTE — Progress Notes (Signed)
Pacer checked in clinic. 

## 2011-06-28 ENCOUNTER — Encounter: Payer: Medicare Other | Admitting: *Deleted

## 2011-07-15 ENCOUNTER — Other Ambulatory Visit: Payer: Self-pay | Admitting: Internal Medicine

## 2011-07-24 ENCOUNTER — Ambulatory Visit (INDEPENDENT_AMBULATORY_CARE_PROVIDER_SITE_OTHER): Payer: Medicare Other | Admitting: *Deleted

## 2011-07-24 DIAGNOSIS — I4891 Unspecified atrial fibrillation: Secondary | ICD-10-CM

## 2011-08-12 ENCOUNTER — Other Ambulatory Visit: Payer: Self-pay

## 2011-08-12 ENCOUNTER — Encounter: Payer: Self-pay | Admitting: Internal Medicine

## 2011-08-12 ENCOUNTER — Ambulatory Visit (HOSPITAL_COMMUNITY)
Admission: RE | Admit: 2011-08-12 | Discharge: 2011-08-12 | Disposition: A | Discharge status: Home / Self Care | Payer: Medicare Other | Source: Ambulatory Visit | Attending: Internal Medicine | Admitting: Internal Medicine

## 2011-08-12 VITALS — BP 110/58 | HR 97 | Wt 258.2 lb

## 2011-08-12 DIAGNOSIS — I4891 Unspecified atrial fibrillation: Secondary | ICD-10-CM | POA: Insufficient documentation

## 2011-08-12 DIAGNOSIS — I5022 Chronic systolic (congestive) heart failure: Secondary | ICD-10-CM | POA: Insufficient documentation

## 2011-08-12 MED ORDER — SIMVASTATIN 20 MG PO TABS: 20.0000 mg | ORAL_TABLET | Freq: Every day | ORAL | Status: DC

## 2011-08-12 NOTE — Patient Instructions (Signed)
Continue current medications.  Continue to weigh daily.  Follow up with Dr. [name] in 4 months.

## 2011-08-12 NOTE — Progress Notes (Signed)
Encounter addended by: Noralee Space, RN on: 08/12/2011 11:53 AM<BR>     Documentation filed: Orders

## 2011-08-12 NOTE — Assessment & Plan Note (Addendum)
Stable with rate control.  Continue current regimen with coumadin.  Ok to hold coumadin for lumbar injections.

## 2011-08-12 NOTE — Assessment & Plan Note (Addendum)
Doing well, volume status looks good and weight remains stable.  Relatively asymptomatic from HF perspective though very limited due to hip pain.  Will continue current regimen at this time.  Continue to weigh daily.  Check labs today.  Consider repeat echo at next visit.   Patient seen and examined with Ulyess Blossom PA-C. We discussed all aspects of the encounter. I agree with the assessment and plan as stated above.

## 2011-08-12 NOTE — Progress Notes (Signed)
HPI:   [name] is a 70 yo male with a h/o  DCM, CHF, HTN, atrial arrythmias (now chronic AF) and obesity.  He initially had an EF of 20-25% and single chamber ICD was implanted. EF then recovered to 50% (echo 2008) and had ICD explanted and BiV pacer placed due to bradycardia.  Had routine follow-up echo in 08/09/10 and showed EF back down to 25-30%. Medcial therapy intensified but f/u echo in 12/06/10 showed worsening EF of 20-25%. So patient underwent R&L cath on 12/21/10  Cath results: 1) Mod nonobs CAD (mLAD 70%, m-dLAD 40%; oDx 60%; mCFX 30-40%; pRCA 30%)  2) RA 21  RV 81/16/24  PA 81/36 (56) PCWP 33 (v to 43) PA sat 43% 48%, Fick 4.2/1.6  After cath admitted for diuresis and titration of hydralazine/nitrates.  Nitrates stopped so he could restart sildenafil.    He returns for follow up today.  He is doing very well besides his hip pain.  He is awaiting a new pt consult from othro at this time.  Continues to weigh daily, stable at home.  He denies SOB/orthopnea/PND. Denies lower extremity edema.  No CP/dizziness/syncope.  Over the last month he feels he has pulled a muscle in his shoulder, occ pain now lasting several seconds.  No signs of bleeding.   ROS: All systems negative except as listed in HPI, PMH and Problem List.  Past Medical History  Diagnosis Date  . Obesities, morbid   . Hypertension   . Hyperlipidemia   . Diabetes mellitus   . Polycythemia     vera requiring phlebotomy and treatment with hydroxyurea on a chronic bais. [facility].  . CHF (congestive heart failure)     secondary to nonischemic cardiomyopathy. EF initially 20-25% up to 50% in 2008. Echo 2/12 EF 20-25% . RHC 3/12: RA 21  RV 81/16/24  PA 81/36 (56) PCWP 33 (v to 43) PA sat 43% 48%.status post Medtronic biventricular ICD placement.(with Sprint Fidelis leads)-explanted--now with [employer] B[employer]   . 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 Atrial  fibrillation     Chronic. On coumadin.  . Coronary artery disease, non-occlusive     Cath 3/12: mLAD 70%, m-dLAD 40%; Dx 60%; mCFX 30-40%; pRCA 30%)    Current Outpatient Prescriptions  Medication Sig Dispense Refill  . acarbose (PRECOSE) 50 MG tablet Take 50 mg by mouth. Take 1/2 tablet 3 times daily       . allopurinol (ZYLOPRIM) 300 MG tablet Take 300 mg by mouth daily.        Marland Kitchen aspirin 81 MG tablet Take 81 mg by mouth daily.        . benazepril (LOTENSIN) 20 MG tablet Take 10 mg by mouth daily.       . carvedilol (COREG) 25 MG tablet Take 25 mg by mouth 2 (two) times daily.        . digoxin (LANOXIN) 0.125 MG tablet TAKE 1 TABLET BY MOUTH EVERY DAY  30 tablet  9  . furosemide (LASIX) 20 MG tablet Take 2 tablets (40 mg total) by mouth 2 (two) times daily.  120 tablet  5  . glipiZIDE (GLUCOTROL) 10 MG tablet Take 10 mg by mouth 2 (two) times daily.        . Glucose Blood (BAYER BREEZE 2 TEST) DISK by In Vitro route. Test sugar 3 times a day       . hydrALAZINE (  APRESOLINE) 50 MG tablet Take 1 tablet (50 mg total) by mouth 3 (three) times daily.  90 tablet  6  . HYDROcodone-acetaminophen (VICODIN) 5-500 MG per tablet Take 2 tablets by mouth every 6 (six) hours as needed.        . hydroxyurea (HYDREA) 500 MG capsule Take 500 mg by mouth 3 (three) times daily. May take with food to minimize GI side effects.       . insulin NPH (HUMULIN N,NOVOLIN N) 100 UNIT/ML injection Inject into the skin. Use as directed       . metoCLOPramide (REGLAN) 10 MG tablet Take 10 mg by mouth every 6 (six) hours as needed.        . potassium chloride (KLOR-CON) 20 MEQ packet Take 20 mEq by mouth daily.        . simvastatin (ZOCOR) 20 MG tablet Take 1 tablet (20 mg total) by mouth at bedtime.  30 tablet  3  . terazosin (HYTRIN) 2 MG capsule Take 2 mg by mouth at bedtime.        Marland Kitchen warfarin (COUMADIN) 6 MG tablet Use daily as directed by anticoagulation clinic.  30 tablet  2     PHYSICAL EXAM: Filed Vitals:    08/12/11 1029  BP: 110/58  Pulse: 97  Wt: 258  General:  Well appearing. No resp difficulty HEENT: normal Neck: supple. JVP flat. Carotids 2+ bilaterally; no bruits. No lymphadenopathy or thryomegaly appreciated. Cor: PMI laterally displaced. Regular rate & rhythm. No rubs, gallops or murmurs. Lungs: clear Abdomen: obese soft, nontender, nondistended. No hepatosplenomegaly. No bruits or masses. Good bowel sounds. Extremities: no cyanosis, clubbing, rash, edema Neuro: alert & orientedx3, cranial nerves grossly intact. Moves all 4 extremities. Affect pleasant.    ECG: Atrial fib with V-pacing 71bpm, PVC   ASSESSMENT & PLAN:

## 2011-08-12 NOTE — Progress Notes (Signed)
Patient seen and examined with Nicki Bradley PA-C. We discussed all aspects of the encounter. I agree with the assessment and plan as stated above.   

## 2011-08-14 ENCOUNTER — Telehealth: Payer: Self-pay | Admitting: Internal Medicine

## 2011-08-14 NOTE — Telephone Encounter (Signed)
Pt returning call to our office. Pt doesn't know who called nor what the call was regarding. Please call back.

## 2011-08-14 NOTE — Telephone Encounter (Signed)
I told pt I did not call him but would send a message to the heart failure clinic in case they called him.  He also states he was given some paperwork to fill out by the heart failure clinic but lost it.

## 2011-08-15 NOTE — Telephone Encounter (Signed)
Spoke w/pt I didn't call him but he states he lost his instruction sheet from last appt, new one mailed to him

## 2011-08-21 ENCOUNTER — Ambulatory Visit (INDEPENDENT_AMBULATORY_CARE_PROVIDER_SITE_OTHER): Payer: Medicare Other | Admitting: *Deleted

## 2011-08-21 DIAGNOSIS — Z7901 Long term (current) use of anticoagulants: Secondary | ICD-10-CM

## 2011-08-21 DIAGNOSIS — I4891 Unspecified atrial fibrillation: Secondary | ICD-10-CM

## 2011-09-09 ENCOUNTER — Telehealth: Payer: Self-pay | Admitting: Internal Medicine

## 2011-09-09 NOTE — Telephone Encounter (Signed)
New Msg: Pt calling wanting to speak with Dr. [name] about Md recommend a orthopedic Md for pt to see. Please return pt call to discuss further.

## 2011-09-09 NOTE — Telephone Encounter (Signed)
Dr Teressa Lower, pt has authorization to have a non [facility], can you please suggest a few you would recommend for him. Pt wants your advise.

## 2011-09-16 ENCOUNTER — Other Ambulatory Visit: Payer: Self-pay | Admitting: *Deleted

## 2011-09-16 MED ORDER — HYDRALAZINE HCL 50 MG PO TABS: 50.0000 mg | ORAL_TABLET | Freq: Three times a day (TID) | ORAL | Status: DC

## 2011-09-23 ENCOUNTER — Ambulatory Visit (INDEPENDENT_AMBULATORY_CARE_PROVIDER_SITE_OTHER): Payer: Medicare Other | Admitting: *Deleted

## 2011-09-23 ENCOUNTER — Telehealth: Payer: Self-pay

## 2011-09-23 DIAGNOSIS — I4891 Unspecified atrial fibrillation: Secondary | ICD-10-CM

## 2011-09-23 DIAGNOSIS — Z7901 Long term (current) use of anticoagulants: Secondary | ICD-10-CM

## 2011-09-23 LAB — POCT INR: INR: 2

## 2011-09-23 NOTE — Telephone Encounter (Signed)
[facility] Triage Call Report Triage Record Num: [id number] Operator: Amy Head Patient Name: [name] Call Date & Time: 09/18/2011 6:21:12PM Patient Phone: [phone] PCP: Crawford Givens Patient Gender: Male PCP Fax : Patient DOB: [birth year] Practice Name: [facility] Reason for Call: Caller: Marley/Patient; PCP: Crawford Givens Clelia Croft); CB#: [phone]; Call Reason: Office Called Pt and Pt Returning Call.; Sx Onset: 09/18/2011; Sx Notes: Does not know what call is about. Wants office to call him back on his cell phone, # [phone]. ; Afebrile; Wt: ; Guideline Used: Office Note; Disp: F/U with office tomorrow. ; Appt Scheduled?: NO Protocol(s) Used: Office Note Recommended Outcome per Protocol: Information Noted and Sent to Office Reason for Outcome: Caller information to office Care Advice: ~ 09/18/2011 6:23:38PM Page 1 of 1 [email or web address]

## 2011-09-23 NOTE — Telephone Encounter (Signed)
Please call patient and clarify this (or have cards do so).  It was likely about his INR visit today with cards clinic.

## 2011-09-25 NOTE — Telephone Encounter (Signed)
It is not our patient either.  PCP in the banner says Lendon Ka.  I don't know who that is but it is not at our practice and none of our MD's have ever seen this patient.

## 2011-09-25 NOTE — Telephone Encounter (Signed)
Telephoned pt and he states all he need or wanted was for Dr [name] to suggested a Orthopedic MD. He states this has already been taken care of per suggestion from family member.

## 2011-10-02 ENCOUNTER — Other Ambulatory Visit: Payer: Self-pay | Admitting: Internal Medicine

## 2011-10-03 IMAGING — CR DG CHEST 2V
2 series · 2 of 2 positions shown · non-contrast
Comparison: 06/11/2005.

CLINICAL DATA: Left ventricular dysfunction.  Post pacemaker
insertion.

CHEST - 2 VIEW

[w chest pa]
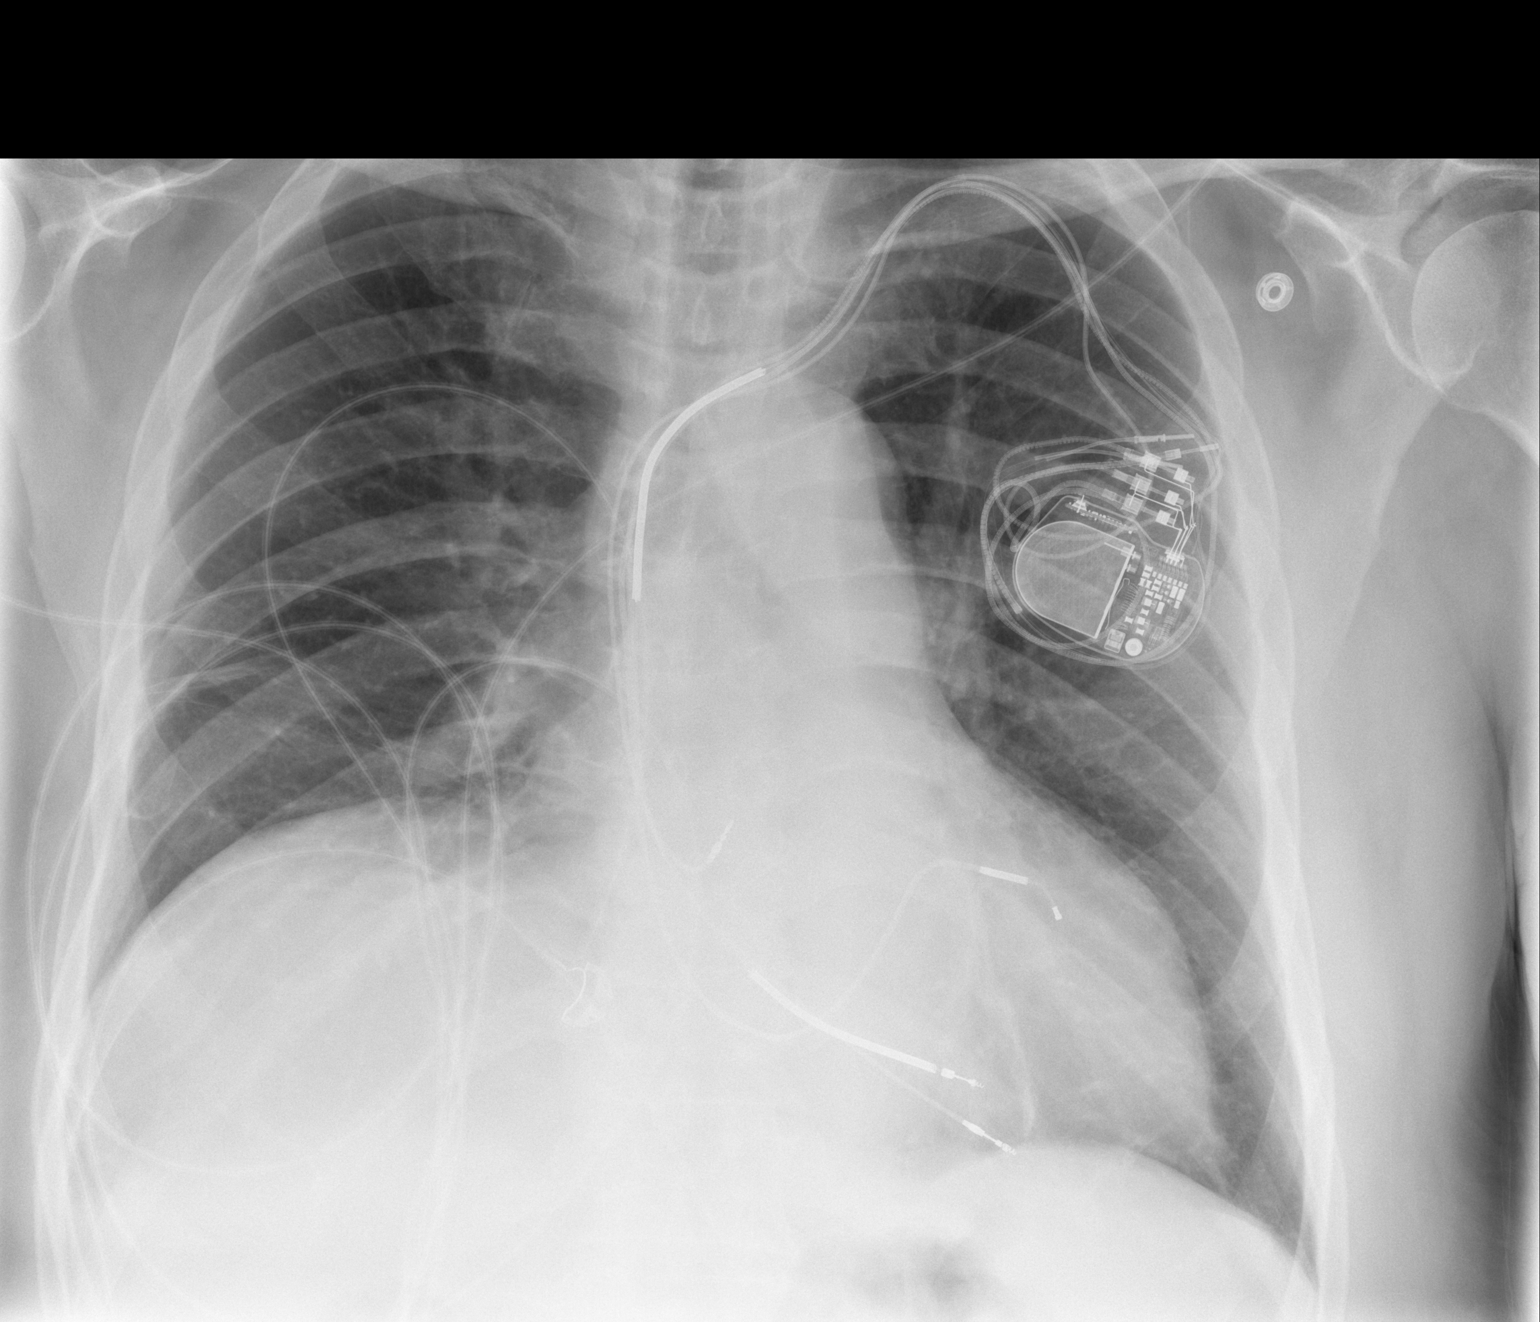

[w chest lat]
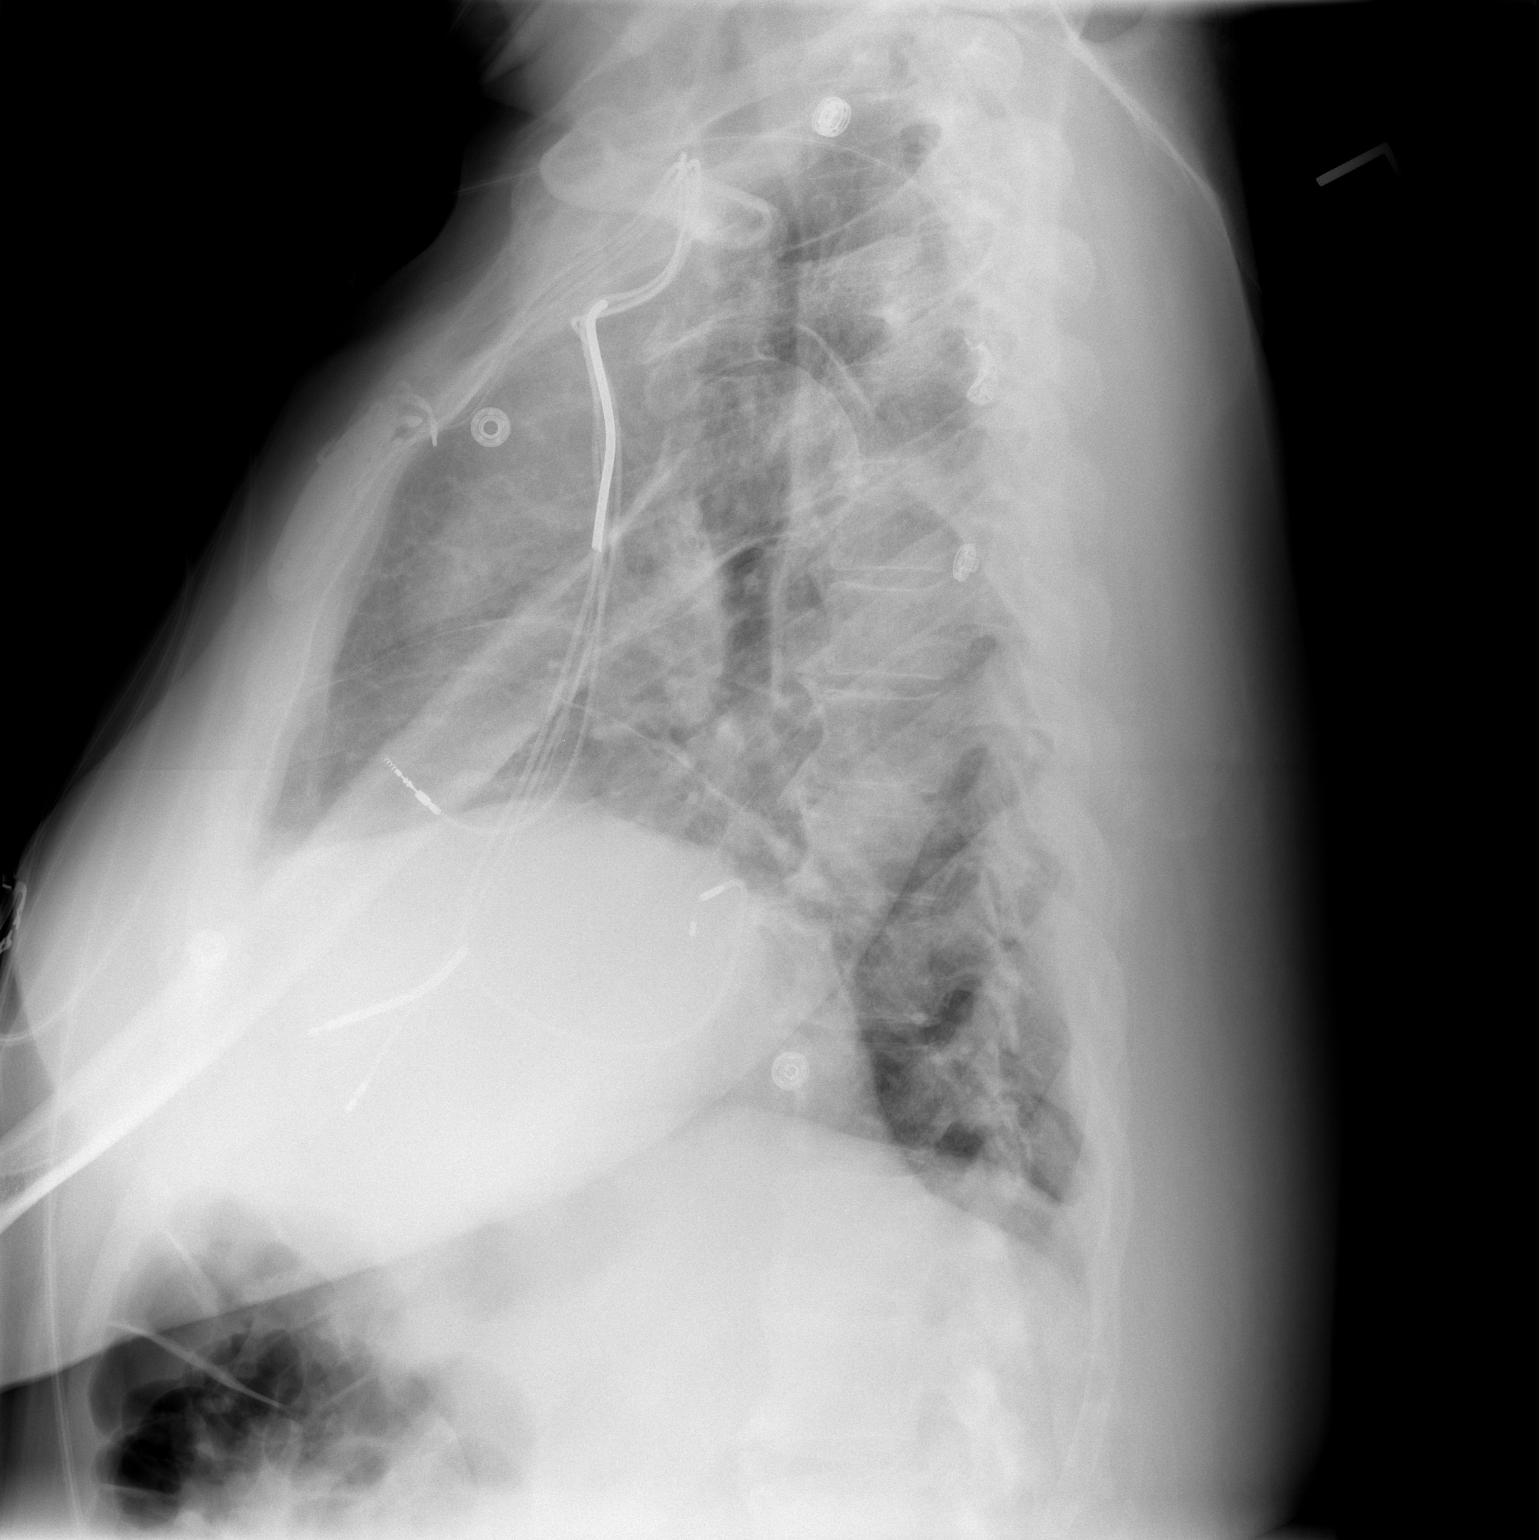

[2 of 2 positions shown; findings below may reference images not displayed]

FINDINGS: In the interval since the prior exam, the pacemaker power
pack has been changed and an additional right ventricular apex
pacing lead has been placed.  Old AICD leads and coronary sinus
lead remain unchanged.  Right atrial appendage lead is also
unchanged.  There is elevation of the right hemidiaphragm which is
chronic.  Mild right basilar atelectasis.  Mediastinal contours
unchanged.  No pneumothorax.
IMPRESSION: Uncomplicated revision of left subclavian pacemaker.  No
pneumothorax.

## 2011-11-04 ENCOUNTER — Ambulatory Visit (INDEPENDENT_AMBULATORY_CARE_PROVIDER_SITE_OTHER): Payer: Medicare Other | Admitting: *Deleted

## 2011-11-04 DIAGNOSIS — Z7901 Long term (current) use of anticoagulants: Secondary | ICD-10-CM

## 2011-11-04 DIAGNOSIS — I4891 Unspecified atrial fibrillation: Secondary | ICD-10-CM

## 2011-11-04 LAB — POCT INR: INR: 1.7

## 2011-11-25 ENCOUNTER — Other Ambulatory Visit: Payer: Self-pay | Admitting: Cardiovascular Disease

## 2011-12-09 ENCOUNTER — Ambulatory Visit (HOSPITAL_COMMUNITY)
Admission: RE | Admit: 2011-12-09 | Discharge: 2011-12-09 | Disposition: A | Discharge status: Home / Self Care | Payer: Medicare Other | Source: Ambulatory Visit | Attending: Internal Medicine | Admitting: Internal Medicine

## 2011-12-09 VITALS — BP 108/68 | HR 100 | Wt 264.0 lb

## 2011-12-09 DIAGNOSIS — I5022 Chronic systolic (congestive) heart failure: Secondary | ICD-10-CM | POA: Insufficient documentation

## 2011-12-09 NOTE — Progress Notes (Signed)
HPI:   [name] is a 71 yo male with a h/o  DCM, CHF, HTN, atrial arrythmias (now chronic AF) and obesity.  He initially had an EF of 20-25% and single chamber ICD was implanted. EF then recovered to 50% (echo 2008) and had ICD explanted and BiV pacer placed due to bradycardia.  Had routine follow-up echo in 08/09/10 and showed EF back down to 25-30%. Medcial therapy intensified but f/u echo in 12/06/10 showed worsening EF of 20-25%. So patient underwent R&L cath on 12/21/10  Cath results: 1) Mod nonobs CAD (mLAD 70%, m-dLAD 40%; oDx 60%; mCFX 30-40%; pRCA 30%)  2) RA 21  RV 81/16/24  PA 81/36 (56) PCWP 33 (v to 43) PA sat 43% 48%, Fick 4.2/1.6  After cath admitted for diuresis and titration of hydralazine/nitrates.  Nitrates stopped so he could restart sildenafil.    He returns for follow up today.  He is doing well.  His hip pain has improved.  No dyspnea/orthopn/PND.  No dizziness.  Weighing daily at home, fluctuates some but overall stable.  No lower extremity edema.  No chest pain.  Doing well with coumadin, no signs of bleeding.     ROS: All systems negative except as listed in HPI, PMH and Problem List.  Past Medical History  Diagnosis Date  . Obesities, morbid   . Hypertension   . Hyperlipidemia   . Diabetes mellitus   . Polycythemia     vera requiring phlebotomy and treatment with hydroxyurea on a chronic bais. [facility]  . CHF (congestive heart failure)     secondary to nonischemic cardiomyopathy. EF initially 20-25% up to 50% in 2008. Echo 2/12 EF 20-25% . RHC 3/12: RA 21  RV 81/16/24  PA 81/36 (56) PCWP 33 (v to 43) PA sat 43% 48%.status post Medtronic biventricular ICD placement.(with Sprint Fidel[employer] leads)-explanted--now with [employer] Anthem BivPM   . 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 Atrial fibrillation     Chronic. On coumadin.  . Coronary artery disease, non-occlusive     Cath 3/12: mLAD 70%, m-dLAD 40%; Dx  60%; mCFX 30-40%; pRCA 30%)    Current Outpatient Prescriptions  Medication Sig Dispense Refill  . acarbose (PRECOSE) 50 MG tablet Take 50 mg by mouth. Take 1/2 tablet 3 times daily       . allopurinol (ZYLOPRIM) 300 MG tablet Take 300 mg by mouth daily.        Marland Kitchen aspirin 81 MG tablet Take 81 mg by mouth daily.        . benazepril (LOTENSIN) 20 MG tablet Take 10 mg by mouth daily.       . carvedilol (COREG) 25 MG tablet Take 25 mg by mouth 2 (two) times daily.        . digoxin (LANOXIN) 0.125 MG tablet TAKE 1 TABLET BY MOUTH EVERY DAY  30 tablet  9  . furosemide (LASIX) 20 MG tablet Take 20 mg by mouth 2 (two) times daily.      Marland Kitchen glipiZIDE (GLUCOTROL) 10 MG tablet Take 10 mg by mouth 2 (two) times daily.        . Glucose Blood (BAYER BREEZE 2 TEST) DISK by In Vitro route. Test sugar 3 times a day       . hydrALAZINE (APRESOLINE) 50 MG tablet Take 1 tablet (50 mg total) by mouth 3 (three) times daily.  90 tablet  6  . hydroxyurea (HYDREA)  500 MG capsule Take 500 mg by mouth 3 (three) times daily. May take with food to minimize GI side effects.       . insulin NPH (HUMULIN N,NOVOLIN N) 100 UNIT/ML injection Inject 80-122 Units into the skin. Take 80 units at night and 122 units in the morning.      Marland Kitchen KLOR-CON M20 20 MEQ tablet TAKE 1 TABLET BY MOUTH TWICE A DAY  60 tablet  6  . naproxen sodium (ANAPROX) 220 MG tablet Take 220 mg by mouth 2 (two) times daily with a meal.      . simvastatin (ZOCOR) 20 MG tablet Take 1 tablet (20 mg total) by mouth at bedtime.  30 tablet  11  . terazosin (HYTRIN) 2 MG capsule Take 2 mg by mouth at bedtime as needed. As needed for help with urination      . warfarin (COUMADIN) 6 MG tablet TAKE AS DIRECTED BY COUMADIN CLINIC  30 tablet  6  . HYDROcodone-acetaminophen (VICODIN) 5-500 MG per tablet Take 2 tablets by mouth every 6 (six) hours as needed.        . metoCLOPramide (REGLAN) 10 MG tablet Take 10 mg by mouth every 6 (six) hours as needed.           PHYSICAL  EXAM: Filed Vitals:   12/09/11 1343  BP: 108/68  Pulse: 100  Weight: 264 lb (119.75 kg)  SpO2: 99%     General:  Well appearing. No resp difficulty HEENT: normal Neck: supple. JVP flat. Carotids 2+ bilaterally; no bruits. No lymphadenopathy or thryomegaly appreciated. Cor: PMI laterally displaced. Regular rate & rhythm. No rubs, gallops or murmurs. Lungs: clear Abdomen: obese soft, nontender, nondistended. No hepatosplenomegaly. No bruits or masses. Good bowel sounds. Extremities: no cyanosis, clubbing, rash, edema Neuro: alert & orientedx3, cranial nerves grossly intact. Moves all 4 extremities. Affect pleasant.    ASSESSMENT & PLAN:

## 2011-12-09 NOTE — Assessment & Plan Note (Signed)
Doing well, volume status looks good and weight remains stable.  Will continue current regimen at this time.  Continue to weigh daily.  Discussed use of sliding scale lasix.  Follow up 4 months.

## 2011-12-09 NOTE — Patient Instructions (Signed)
Continue current medications  Follow up in 4 months

## 2011-12-16 ENCOUNTER — Ambulatory Visit (INDEPENDENT_AMBULATORY_CARE_PROVIDER_SITE_OTHER): Payer: Medicare Other

## 2011-12-16 DIAGNOSIS — Z7901 Long term (current) use of anticoagulants: Secondary | ICD-10-CM

## 2011-12-16 DIAGNOSIS — I4891 Unspecified atrial fibrillation: Secondary | ICD-10-CM

## 2011-12-16 LAB — POCT INR: INR: 2.2

## 2012-01-27 ENCOUNTER — Ambulatory Visit (INDEPENDENT_AMBULATORY_CARE_PROVIDER_SITE_OTHER): Payer: Medicare Other | Admitting: Pharmacist

## 2012-01-27 DIAGNOSIS — Z7901 Long term (current) use of anticoagulants: Secondary | ICD-10-CM

## 2012-01-27 DIAGNOSIS — I4891 Unspecified atrial fibrillation: Secondary | ICD-10-CM

## 2012-01-27 LAB — POCT INR: INR: 2.5

## 2012-03-10 ENCOUNTER — Ambulatory Visit (INDEPENDENT_AMBULATORY_CARE_PROVIDER_SITE_OTHER): Payer: Medicare Other

## 2012-03-10 DIAGNOSIS — Z7901 Long term (current) use of anticoagulants: Secondary | ICD-10-CM

## 2012-03-10 DIAGNOSIS — I4891 Unspecified atrial fibrillation: Secondary | ICD-10-CM

## 2012-03-10 LAB — POCT INR: INR: 2.4

## 2012-03-11 ENCOUNTER — Encounter: Payer: Self-pay | Admitting: Internal Medicine

## 2012-03-11 ENCOUNTER — Ambulatory Visit (INDEPENDENT_AMBULATORY_CARE_PROVIDER_SITE_OTHER): Payer: Medicare Other | Admitting: *Deleted

## 2012-03-11 DIAGNOSIS — I495 Sick sinus syndrome: Secondary | ICD-10-CM

## 2012-03-11 DIAGNOSIS — I5022 Chronic systolic (congestive) heart failure: Secondary | ICD-10-CM

## 2012-03-11 LAB — PACEMAKER DEVICE OBSERVATION
AL AMPLITUDE: 1.8 mv
AL IMPEDENCE PM: 525 Ohm
BATTERY VOLTAGE: 2.9328 V
RV LEAD IMPEDENCE PM: 450 Ohm
VENTRICULAR PACING PM: 97

## 2012-03-11 NOTE — Progress Notes (Signed)
PPM check 

## 2012-04-01 ENCOUNTER — Telehealth (HOSPITAL_COMMUNITY): Payer: Self-pay | Admitting: Vascular Surgery

## 2012-04-01 NOTE — Telephone Encounter (Signed)
Ok to hold coumadin and ASA for procedure. Resume immediately afterward.

## 2012-04-01 NOTE — Telephone Encounter (Signed)
Pt is getting some teeth pulled on July 2  HE WANTS TO KNOW WHEN SHOULD HE STOP TAKING HAS BLOOD THINNER. PLEASE CALL HIM BACK

## 2012-04-01 NOTE — Telephone Encounter (Signed)
Pt states his dentist wants him to hold coumadin and ASA before teeth extraction, will check with Dr [name] and call him back tomorrow

## 2012-04-03 NOTE — Telephone Encounter (Signed)
Pt is aware.  

## 2012-04-09 ENCOUNTER — Encounter (HOSPITAL_COMMUNITY): Payer: Self-pay

## 2012-04-09 ENCOUNTER — Ambulatory Visit (HOSPITAL_COMMUNITY)
Admission: RE | Admit: 2012-04-09 | Discharge: 2012-04-09 | Disposition: A | Discharge status: Home / Self Care | Payer: Medicare Other | Source: Ambulatory Visit | Attending: Internal Medicine | Admitting: Internal Medicine

## 2012-04-09 VITALS — BP 116/62 | HR 70 | Resp 18 | Ht 74.0 in | Wt 254.8 lb

## 2012-04-09 DIAGNOSIS — Z7982 Long term (current) use of aspirin: Secondary | ICD-10-CM | POA: Insufficient documentation

## 2012-04-09 DIAGNOSIS — Z7901 Long term (current) use of anticoagulants: Secondary | ICD-10-CM | POA: Insufficient documentation

## 2012-04-09 DIAGNOSIS — I4891 Unspecified atrial fibrillation: Secondary | ICD-10-CM | POA: Insufficient documentation

## 2012-04-09 DIAGNOSIS — E119 Type 2 diabetes mellitus without complications: Secondary | ICD-10-CM | POA: Insufficient documentation

## 2012-04-09 DIAGNOSIS — E785 Hyperlipidemia, unspecified: Secondary | ICD-10-CM | POA: Insufficient documentation

## 2012-04-09 DIAGNOSIS — I509 Heart failure, unspecified: Secondary | ICD-10-CM | POA: Insufficient documentation

## 2012-04-09 DIAGNOSIS — Z950 Presence of cardiac pacemaker: Secondary | ICD-10-CM | POA: Insufficient documentation

## 2012-04-09 DIAGNOSIS — I2510 Atherosclerotic heart disease of native coronary artery without angina pectoris: Secondary | ICD-10-CM | POA: Insufficient documentation

## 2012-04-09 DIAGNOSIS — I5022 Chronic systolic (congestive) heart failure: Secondary | ICD-10-CM | POA: Insufficient documentation

## 2012-04-09 DIAGNOSIS — I10 Essential (primary) hypertension: Secondary | ICD-10-CM | POA: Insufficient documentation

## 2012-04-09 DIAGNOSIS — Z794 Long term (current) use of insulin: Secondary | ICD-10-CM | POA: Insufficient documentation

## 2012-04-09 DIAGNOSIS — D45 Polycythemia vera: Secondary | ICD-10-CM | POA: Insufficient documentation

## 2012-04-09 NOTE — Progress Notes (Signed)
Patient ID: MACLIN GUERRETTE, male   DOB: [birth year], 71 y.o.   MRN: [id number] HPI:   Mr. Galvan is a 71 yo male with a h/o  DCM, CHF, HTN, atrial arrythmias (now chronic AF) and obesity.  He initially had an EF of 20-25% and single chamber ICD was implanted. EF then recovered to 50% (echo 2008) and had ICD explanted and BiV pacer placed due to bradycardia.  Had routine follow-up echo in 08/09/10 and showed EF back down to 25-30%. Medcial therapy intensified but f/u echo in 12/06/10 showed worsening EF of 20-25%. So patient underwent R&L cath on 12/21/10  Cath results: 1) Mod nonobs CAD (mLAD 70%, m-dLAD 40%; oDx 60%; mCFX 30-40%; pRCA 30%)  2) RA 21  RV 81/16/24  PA 81/36 (56) PCWP 33 (v to 43) PA sat 43% 48%, Fick 4.2/1.6  After cath admitted for diuresis and titration of hydralazine/nitrates.  Nitrates stopped so he could restart sildenafil.    He returns for follow up today. Stopping aspirin and coumadin in preparation for tooth extraction 04/14/12. He will resume aspirin and coumadin 04/15/12. Denies SOB/PND/Orthopnea. Walks with walker. Compliant with medications. Weight at home 249-255 pounds. He takes additional Lasix if his weight increases to 260 pounds however he has not required extra Lasix. Denies lower extremity edema. [facility].    ROS: All systems negative except as listed in HPI, PMH and Problem List.  Past Medical History  Diagnosis Date  . Obesities, morbid   . Hypertension   . Hyperlipidemia   . Diabetes mellitus   . Polycythemia     vera requiring phlebotomy and treatment with hydroxyurea on a chronic bais. [facility].  . CHF (congestive heart failure)     secondary to nonischemic cardiomyopathy. EF initially 20-25% up to 50% in 2008. Echo 2/12 EF 20-25% . RHC 3/12: RA 21  RV 81/16/24  PA 81/36 (56) PCWP 33 (v to 43) PA sat 43% 48%.status post Medtronic biventricular ICD placement.(with Sprint Fidelis leads)-explanted--now with [employer]  Anthem BivPM   . 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 Atrial fibrillation     Chronic. On coumadin.  . Coronary artery disease, non-occlusive     Cath 3/12: mLAD 70%, m-dLAD 40%; Dx 60%; mCFX 30-40%; pRCA 30%)    Current Outpatient Prescriptions  Medication Sig Dispense Refill  . acarbose (PRECOSE) 50 MG tablet Take 50 mg by mouth. Take 1/2 tablet 3 times daily       . allopurinol (ZYLOPRIM) 300 MG tablet Take 300 mg by mouth daily.        Marland Kitchen aspirin 81 MG tablet Take 81 mg by mouth daily.        . benazepril (LOTENSIN) 20 MG tablet Take 10 mg by mouth 2 (two) times daily.       . carvedilol (COREG) 25 MG tablet Take 25 mg by mouth 2 (two) times daily.        . digoxin (LANOXIN) 0.125 MG tablet TAKE 1 TABLET BY MOUTH EVERY DAY  30 tablet  9  . furosemide (LASIX) 20 MG tablet Take 20 mg by mouth 2 (two) times daily.      Marland Kitchen glipiZIDE (GLUCOTROL) 10 MG tablet Take 10 mg by mouth 2 (two) times daily.        . Glucose Blood (BAYER BREEZE 2 TEST) DISK by In Vitro route. Test sugar 3 times a day       .  hydrALAZINE (APRESOLINE) 50 MG tablet Take 1 tablet (50 mg total) by mouth 3 (three) times daily.  90 tablet  6  . HYDROcodone-acetaminophen (VICODIN) 5-500 MG per tablet Take 2 tablets by mouth every 6 (six) hours as needed.        . hydroxyurea (HYDREA) 500 MG capsule Take 500 mg by mouth 3 (three) times daily. May take with food to minimize GI side effects.       . insulin NPH (HUMULIN N,NOVOLIN N) 100 UNIT/ML injection Inject 18-22 Units into the skin. Take18units at night and 22 units in the morning.      Marland Kitchen KLOR-CON M20 20 MEQ tablet TAKE 1 TABLET BY MOUTH TWICE A DAY  60 tablet  6  . metoCLOPramide (REGLAN) 10 MG tablet Take 10 mg by mouth every 6 (six) hours as needed.        . naproxen sodium (ANAPROX) 220 MG tablet Take 220 mg by mouth 2 (two) times daily with a meal.      . simvastatin (ZOCOR) 20 MG tablet Take 1 tablet (20 mg total) by mouth at bedtime.  30  tablet  11  . terazosin (HYTRIN) 2 MG capsule Take 2 mg by mouth at bedtime as needed. As needed for help with urination      . warfarin (COUMADIN) 6 MG tablet TAKE AS DIRECTED BY COUMADIN CLINIC  30 tablet  6     PHYSICAL EXAM: Filed Vitals:   04/09/12 0935  BP: 116/62  Pulse: 70  Resp: 18  Height: 6\' 2"  (1.88 m)  Weight: 254 lb 12.8 oz (115.577 kg)  SpO2: 97%     General:  Well appearing. No resp difficulty HEENT: normal Neck: supple. JVP flat. Carotids 2+ bilaterally; no bruits. No lymphadenopathy or thryomegaly appreciated. Cor: PMI laterally displaced. Regular rate & rhythm. No rubs, gallops or murmurs. Lungs: clear Abdomen: obese soft, nontender, nondistended. No hepatosplenomegaly. No bruits or masses. Good bowel sounds. Extremities: no cyanosis, clubbing, rash, edema Neuro: alert & orientedx3, cranial nerves grossly intact. Moves all 4 extremities. Affect pleasant.    ASSESSMENT & PLAN:

## 2012-04-09 NOTE — Patient Instructions (Addendum)
Follow up in 4 months with ECHO  Do the following things EVERYDAY: 1) Weigh yourself in the morning before breakfast. Write it down and keep it in a log. 2) Take your medicines as prescribed 3) Eat low salt foods-Limit salt (sodium) to 2000 mg per day.  4) Stay as active as you can everyday 5) Limit all fluids for the day to less than 2 liters 

## 2012-04-09 NOTE — Assessment & Plan Note (Signed)
Volume status stable. Continue current diuretic regimen. Reinforced sliding scale diuretics if weight increases 260 pounds he will take an additional Lasix.  Follow up in 4 months with an ECHO.

## 2012-04-09 NOTE — Assessment & Plan Note (Signed)
Aspirin and Coumadin on hold for tooth extraction 04/14/12. He will resume aspirin and coumadin 04/15/12. Rate controlled.

## 2012-04-20 ENCOUNTER — Other Ambulatory Visit (HOSPITAL_COMMUNITY): Payer: Self-pay | Admitting: Adult Health

## 2012-04-20 MED ORDER — HYDRALAZINE HCL 50 MG PO TABS: 50.0000 mg | ORAL_TABLET | Freq: Three times a day (TID) | ORAL | Status: DC

## 2012-04-23 ENCOUNTER — Ambulatory Visit (INDEPENDENT_AMBULATORY_CARE_PROVIDER_SITE_OTHER): Payer: Medicare Other | Admitting: *Deleted

## 2012-04-23 DIAGNOSIS — Z7901 Long term (current) use of anticoagulants: Secondary | ICD-10-CM

## 2012-04-23 DIAGNOSIS — I4891 Unspecified atrial fibrillation: Secondary | ICD-10-CM

## 2012-05-06 ENCOUNTER — Ambulatory Visit (INDEPENDENT_AMBULATORY_CARE_PROVIDER_SITE_OTHER): Payer: Medicare Other | Admitting: *Deleted

## 2012-05-06 DIAGNOSIS — Z7901 Long term (current) use of anticoagulants: Secondary | ICD-10-CM

## 2012-05-06 DIAGNOSIS — I4891 Unspecified atrial fibrillation: Secondary | ICD-10-CM

## 2012-05-08 ENCOUNTER — Other Ambulatory Visit: Payer: Self-pay | Admitting: *Deleted

## 2012-05-08 MED ORDER — WARFARIN SODIUM 6 MG PO TABS: ORAL_TABLET | ORAL | Status: DC

## 2012-05-12 ENCOUNTER — Other Ambulatory Visit (HOSPITAL_COMMUNITY): Payer: Self-pay | Admitting: *Deleted

## 2012-05-12 MED ORDER — FUROSEMIDE 20 MG PO TABS: 20.0000 mg | ORAL_TABLET | Freq: Two times a day (BID) | ORAL | Status: DC

## 2012-05-14 ENCOUNTER — Other Ambulatory Visit (HOSPITAL_COMMUNITY): Payer: Self-pay | Admitting: *Deleted

## 2012-05-14 MED ORDER — DIGOXIN 125 MCG PO TABS: 0.1250 mg | ORAL_TABLET | Freq: Every day | ORAL | Status: DC

## 2012-05-20 ENCOUNTER — Ambulatory Visit (INDEPENDENT_AMBULATORY_CARE_PROVIDER_SITE_OTHER): Payer: Medicare Other | Admitting: Pharmacist

## 2012-05-20 DIAGNOSIS — I4891 Unspecified atrial fibrillation: Secondary | ICD-10-CM

## 2012-05-20 DIAGNOSIS — Z7901 Long term (current) use of anticoagulants: Secondary | ICD-10-CM

## 2012-05-20 LAB — POCT INR: INR: 3.2

## 2012-06-10 ENCOUNTER — Ambulatory Visit (INDEPENDENT_AMBULATORY_CARE_PROVIDER_SITE_OTHER): Payer: Medicare Other | Admitting: *Deleted

## 2012-06-10 DIAGNOSIS — I4891 Unspecified atrial fibrillation: Secondary | ICD-10-CM

## 2012-06-10 DIAGNOSIS — Z7901 Long term (current) use of anticoagulants: Secondary | ICD-10-CM

## 2012-06-22 ENCOUNTER — Other Ambulatory Visit (HOSPITAL_COMMUNITY): Payer: Self-pay | Admitting: *Deleted

## 2012-06-22 MED ORDER — CARVEDILOL 25 MG PO TABS: 50.0000 mg | ORAL_TABLET | Freq: Two times a day (BID) | ORAL | Status: DC

## 2012-06-22 NOTE — Telephone Encounter (Signed)
Received [facility] from [facility] Car[facility] 25 mg take 2 tabs Twice daily, our reocrds show pt is on 25 mg Twice daily, called and spoke w/pt he and his wife states he is on 2 tabs Twice daily and have been for a while, advised to continue that dose and new order sent in

## 2012-07-04 ENCOUNTER — Other Ambulatory Visit: Payer: Self-pay | Admitting: Cardiovascular Disease

## 2012-07-07 ENCOUNTER — Ambulatory Visit (INDEPENDENT_AMBULATORY_CARE_PROVIDER_SITE_OTHER): Payer: Medicare Other | Admitting: *Deleted

## 2012-07-07 DIAGNOSIS — I4891 Unspecified atrial fibrillation: Secondary | ICD-10-CM

## 2012-07-07 DIAGNOSIS — Z7901 Long term (current) use of anticoagulants: Secondary | ICD-10-CM

## 2012-07-27 ENCOUNTER — Telehealth (HOSPITAL_COMMUNITY): Payer: Self-pay | Admitting: Cardiology

## 2012-07-27 NOTE — Telephone Encounter (Signed)
Spoke w/pt, he will see device clinic at 9 am tomorrow

## 2012-07-27 NOTE — Telephone Encounter (Signed)
NA after several attempts

## 2012-07-27 NOTE — Telephone Encounter (Signed)
Area around pacemaker is turing blue in color. Area is spreading. Dr.Taylor  Please advise

## 2012-07-28 ENCOUNTER — Ambulatory Visit (INDEPENDENT_AMBULATORY_CARE_PROVIDER_SITE_OTHER): Payer: Medicare Other | Admitting: *Deleted

## 2012-07-28 ENCOUNTER — Ambulatory Visit (INDEPENDENT_AMBULATORY_CARE_PROVIDER_SITE_OTHER): Payer: Medicare Other | Admitting: Pharmacist

## 2012-07-28 DIAGNOSIS — I4891 Unspecified atrial fibrillation: Secondary | ICD-10-CM

## 2012-07-28 DIAGNOSIS — I428 Other cardiomyopathies: Secondary | ICD-10-CM

## 2012-07-28 DIAGNOSIS — Z7901 Long term (current) use of anticoagulants: Secondary | ICD-10-CM

## 2012-07-28 LAB — POCT INR: INR: 2.6

## 2012-07-28 NOTE — Progress Notes (Signed)
Pt seen in clinic today for hematoma on left side. Pt has some swelling at site. Dr Ladona Ridgel evaluated and protime was checked today. Protime 2.6 ---per Dr Ladona Ridgel hold Warfarin and ASA x 1 week and recheck protime 1 week later. Pt aware of following plan and agrees.

## 2012-08-11 ENCOUNTER — Other Ambulatory Visit (HOSPITAL_COMMUNITY): Payer: Self-pay | Admitting: *Deleted

## 2012-08-11 ENCOUNTER — Ambulatory Visit (INDEPENDENT_AMBULATORY_CARE_PROVIDER_SITE_OTHER): Payer: Medicare Other

## 2012-08-11 ENCOUNTER — Telehealth: Payer: Self-pay

## 2012-08-11 DIAGNOSIS — Z7901 Long term (current) use of anticoagulants: Secondary | ICD-10-CM

## 2012-08-11 DIAGNOSIS — I4891 Unspecified atrial fibrillation: Secondary | ICD-10-CM

## 2012-08-11 LAB — POCT INR: INR: 1.2

## 2012-08-11 MED ORDER — SIMVASTATIN 20 MG PO TABS: 20.0000 mg | ORAL_TABLET | Freq: Every day | ORAL | Status: DC

## 2012-08-11 NOTE — Telephone Encounter (Signed)
Pt requesting refill on Viagra

## 2012-08-12 MED ORDER — SILDENAFIL CITRATE 100 MG PO TABS: 100.0000 mg | ORAL_TABLET | Freq: Every day | ORAL | Status: DC | PRN

## 2012-08-12 NOTE — Telephone Encounter (Signed)
Will check with Dr Bensimhon 

## 2012-08-12 NOTE — Addendum Note (Signed)
Addended by: Noralee Space on: 08/12/2012 02:28 PM   Modules accepted: Orders

## 2012-08-12 NOTE — Telephone Encounter (Signed)
Per Dr [name] ok to refill

## 2012-08-25 ENCOUNTER — Ambulatory Visit (INDEPENDENT_AMBULATORY_CARE_PROVIDER_SITE_OTHER): Payer: Medicare Other | Admitting: *Deleted

## 2012-08-25 DIAGNOSIS — Z7901 Long term (current) use of anticoagulants: Secondary | ICD-10-CM

## 2012-08-25 DIAGNOSIS — I4891 Unspecified atrial fibrillation: Secondary | ICD-10-CM

## 2012-08-25 LAB — POCT INR: INR: 1.8

## 2012-09-11 ENCOUNTER — Encounter: Payer: Self-pay | Admitting: *Deleted

## 2012-09-15 ENCOUNTER — Ambulatory Visit (INDEPENDENT_AMBULATORY_CARE_PROVIDER_SITE_OTHER): Payer: Medicare Other | Admitting: Internal Medicine

## 2012-09-15 ENCOUNTER — Encounter: Payer: Self-pay | Admitting: Internal Medicine

## 2012-09-15 ENCOUNTER — Ambulatory Visit (INDEPENDENT_AMBULATORY_CARE_PROVIDER_SITE_OTHER): Payer: Medicare Other

## 2012-09-15 VITALS — BP 145/81 | HR 86 | Ht 75.0 in | Wt 271.6 lb

## 2012-09-15 DIAGNOSIS — I4891 Unspecified atrial fibrillation: Secondary | ICD-10-CM

## 2012-09-15 DIAGNOSIS — I5022 Chronic systolic (congestive) heart failure: Secondary | ICD-10-CM

## 2012-09-15 DIAGNOSIS — Z7901 Long term (current) use of anticoagulants: Secondary | ICD-10-CM

## 2012-09-15 DIAGNOSIS — Z950 Presence of cardiac pacemaker: Secondary | ICD-10-CM

## 2012-09-15 LAB — PACEMAKER DEVICE OBSERVATION
ATRIAL PACING PM: 2.7
BAMS-0001: 150 {beats}/min
DEVICE MODEL PM: 2428292
LV LEAD THRESHOLD: 1 V
RV LEAD IMPEDENCE PM: 425 Ohm
RV LEAD THRESHOLD: 0.625 V

## 2012-09-15 NOTE — Assessment & Plan Note (Signed)
Today the patient has evidence of volume overload based on his weight and physical examination. I've asked him to increase his dose of Lasix to 40 mg twice a day for 3 days and then decrease his dose back to 40 mg daily in divided doses. He is instructed to maintain a low-sodium diet. He is also instructed to increase his physical activity.

## 2012-09-15 NOTE — Assessment & Plan Note (Signed)
His biventricular pacemaker is working normally. We'll plan to recheck in several months. 

## 2012-09-15 NOTE — Patient Instructions (Signed)
Your physician wants you to follow-up in: 6 months with the device clinic and 12 months with Dr Stevan Born will receive a reminder letter in the mail two months in advance. If you don't receive a letter, please call our office to schedule the follow-up appointment.  Increase Furosemide to 40mg  twice daily for 3 days then go back to 20mg  daily

## 2012-09-15 NOTE — Assessment & Plan Note (Signed)
His ventricular rate appears to be well-controlled. He is pacing in the ventricle 94% of the time.

## 2012-09-15 NOTE — Progress Notes (Signed)
HPI Mr. Chiou returns today for followup. He is a very pleasant 71 year old man with chronic systolic heart failure, atrial fibrillation, a nonischemic cardiomyopathy, status post biventricular pacemaker insertion. The patient notes dietary indiscretion as well as medical noncompliance. In the interim his weight is up 5 pounds. He denies worsening heart failure symptoms. He has been out of his Lasix. No Known Allergies   Current Outpatient Prescriptions  Medication Sig Dispense Refill  . acarbose (PRECOSE) 50 MG tablet Take 50 mg by mouth. Take 1/2 tablet 3 times daily       . allopurinol (ZYLOPRIM) 300 MG tablet Take 300 mg by mouth daily.        Marland Kitchen aspirin 81 MG tablet Take 81 mg by mouth daily.        . benazepril (LOTENSIN) 20 MG tablet Take 10 mg by mouth 2 (two) times daily.       . carvedilol (COREG) 25 MG tablet Take 2 tablets (50 mg total) by mouth 2 (two) times daily.  360 tablet  3  . digoxin (LANOXIN) 0.125 MG tablet Take 1 tablet (0.125 mg total) by mouth daily.  30 tablet  9  . furosemide (LASIX) 20 MG tablet Take 1 tablet (20 mg total) by mouth 2 (two) times daily.  120 tablet  5  . glipiZIDE (GLUCOTROL) 10 MG tablet Take 10 mg by mouth 2 (two) times daily.        . Glucose Blood (BAYER BREEZE 2 TEST) DISK by In Vitro route. Test sugar 3 times a day       . hydrALAZINE (APRESOLINE) 50 MG tablet Take 1 tablet (50 mg total) by mouth 3 (three) times daily.  90 tablet  6  . HYDROcodone-acetaminophen (VICODIN) 5-500 MG per tablet Take 2 tablets by mouth every 6 (six) hours as needed.        . hydroxyurea (HYDREA) 500 MG capsule Take 500 mg by mouth 3 (three) times daily. May take with food to minimize GI side effects.       . insulin NPH (HUMULIN N,NOVOLIN N) 100 UNIT/ML injection Inject 18-22 Units into the skin. Take18units at night and 22 units in the morning.      Marland Kitchen KLOR-CON M20 20 MEQ tablet TAKE 1 TABLET BY MOUTH TWICE A DAY  60 each  6  . metoCLOPramide (REGLAN) 10 MG tablet  Take 10 mg by mouth every 6 (six) hours as needed.        . naproxen sodium (ANAPROX) 220 MG tablet Take 220 mg by mouth 2 (two) times daily with a meal.      . sildenafil (VIAGRA) 100 MG tablet Take 1 tablet (100 mg total) by mouth daily as needed for erectile dysfunction.  10 tablet  3  . sildenafil (VIAGRA) 100 MG tablet Take 100 mg by mouth daily as needed.      . simvastatin (ZOCOR) 20 MG tablet Take 1 tablet (20 mg total) by mouth at bedtime.  30 tablet  11  . terazosin (HYTRIN) 2 MG capsule Take 2 mg by mouth at bedtime as needed. As needed for help with urination      . warfarin (COUMADIN) 6 MG tablet Take as directed by coumadin clinic  40 tablet  3  . [DISCONTINUED] sildenafil (VIAGRA) 100 MG tablet Take 1 tablet (100 mg total) by mouth daily as needed for erectile dysfunction.  10 tablet  3     Past Medical History  Diagnosis Date  . Obesities, morbid   .  Hypertension   . Hyperlipidemia   . Diabetes mellitus   . Polycythemia     vera requiring phlebotomy and treatment with hydroxyurea on a chronic bais. [facility]  . CHF (congestive heart failure)     secondary to nonischemic cardiomyopathy. EF initially 20-25% up to 50% in 2008. Echo 2/12 EF 20-25% . RHC 3/12: RA 21  RV 81/16/24  PA 81/36 (56) PCWP 33 (v to 43) PA sat 43% 48%.status post Medtronic biventricular ICD placement.(with Sprint Fidel[employer] leads)-explanted--now with [employer] Anthem BivPM   . 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 Atrial fibrillation     Chronic. On coumadin.  . Coronary artery disease, non-occlusive     Cath 3/12: mLAD 70%, m-dLAD 40%; Dx 60%; mCFX 30-40%; pRCA 30%)    ROS:   All systems reviewed and negative except as noted in the HPI.   Past Surgical History  Procedure Date  . Cardiac catheterization   . Insert / replace / remove pacemaker     ICD - [employer]. Jude     No family history on file.   History   Social History  . Marital  Status: Married    Spouse Name: N/A    Number of Children: N/A  . Years of Education: N/A   Occupational History  . Not on file.   Social History Main Topics  . Smoking status: Former Games developer  . Smokeless tobacco: Not on file  . Alcohol Use: No  . Drug Use: No  . Sexually Active: Not on file   Other Topics Concern  . Not on file   Social History Narrative   RetiredMarried Tobacco Use - NoAlcohol Use - No     BP 145/81  Pulse 86  Ht 6\' 3"  (1.905 m)  Wt 271 lb 9.6 oz (123.197 kg)  BMI 33.95 kg/m2  Physical Exam:  Well appearing 71 year old man, NAD HEENT: Unremarkable Neck:  8 cm JVD, no thyromegally Lungs:  Clear with no wheezes, rhonchi, or increased work of breathing. Bilateral rales are present approximately 1/3 the way up. HEART:  Regular rate rhythm, no murmurs, no rubs, no clicks Abd:  soft, positive bowel sounds, no organomegally, no rebound, no guarding Ext:  2 plus pulses, no cyanosis, no clubbing, 1+ peripheral edema bilaterally Skin:  No rashes no nodules Neuro:  CN II through XII intact, motor grossly intact   DEVICE  Normal device function.  See PaceArt for details.   Assess/Plan:

## 2012-09-29 ENCOUNTER — Ambulatory Visit (INDEPENDENT_AMBULATORY_CARE_PROVIDER_SITE_OTHER): Payer: Medicare Other | Admitting: *Deleted

## 2012-09-29 DIAGNOSIS — I4891 Unspecified atrial fibrillation: Secondary | ICD-10-CM

## 2012-09-29 DIAGNOSIS — Z7901 Long term (current) use of anticoagulants: Secondary | ICD-10-CM

## 2012-10-09 ENCOUNTER — Encounter: Payer: Self-pay | Admitting: Internal Medicine

## 2012-10-16 ENCOUNTER — Other Ambulatory Visit: Payer: Self-pay | Admitting: *Deleted

## 2012-10-16 MED ORDER — WARFARIN SODIUM 6 MG PO TABS: ORAL_TABLET | ORAL | Status: DC

## 2012-10-20 ENCOUNTER — Ambulatory Visit (INDEPENDENT_AMBULATORY_CARE_PROVIDER_SITE_OTHER): Payer: Medicare Other

## 2012-10-20 DIAGNOSIS — I4891 Unspecified atrial fibrillation: Secondary | ICD-10-CM

## 2012-10-20 DIAGNOSIS — Z7901 Long term (current) use of anticoagulants: Secondary | ICD-10-CM

## 2012-10-20 LAB — POCT INR: INR: 3

## 2012-10-22 ENCOUNTER — Telehealth: Payer: Self-pay | Admitting: Internal Medicine

## 2012-10-22 NOTE — Telephone Encounter (Signed)
Pt needs refill warfarin [facility] will be out tomorrow

## 2012-10-22 NOTE — Telephone Encounter (Signed)
[facility] they have it completed Leota Sauers Pharm.D. CPP, BCPS Clinical Pharmacist [phone] 10/22/2012 1:40 PM

## 2012-10-27 ENCOUNTER — Telehealth: Payer: Self-pay | Admitting: Internal Medicine

## 2012-10-27 NOTE — Telephone Encounter (Signed)
N/A.  LMTC. 

## 2012-10-27 NOTE — Telephone Encounter (Signed)
New problem:   1. Patient starting taken presiodone due to recent eye surgery.    2. Dental appt 2/19 - having 11 tooth remove . Please advise on coumadin.

## 2012-10-30 ENCOUNTER — Telehealth: Payer: Self-pay | Admitting: *Deleted

## 2012-10-30 IMAGING — CR DG CHEST 1V PORT
1 series · 1 of 1 positions shown · non-contrast
Comparison: 11/23/2009

CLINICAL DATA: Heart failure

PORTABLE CHEST - 1 VIEW

[view not recorded]
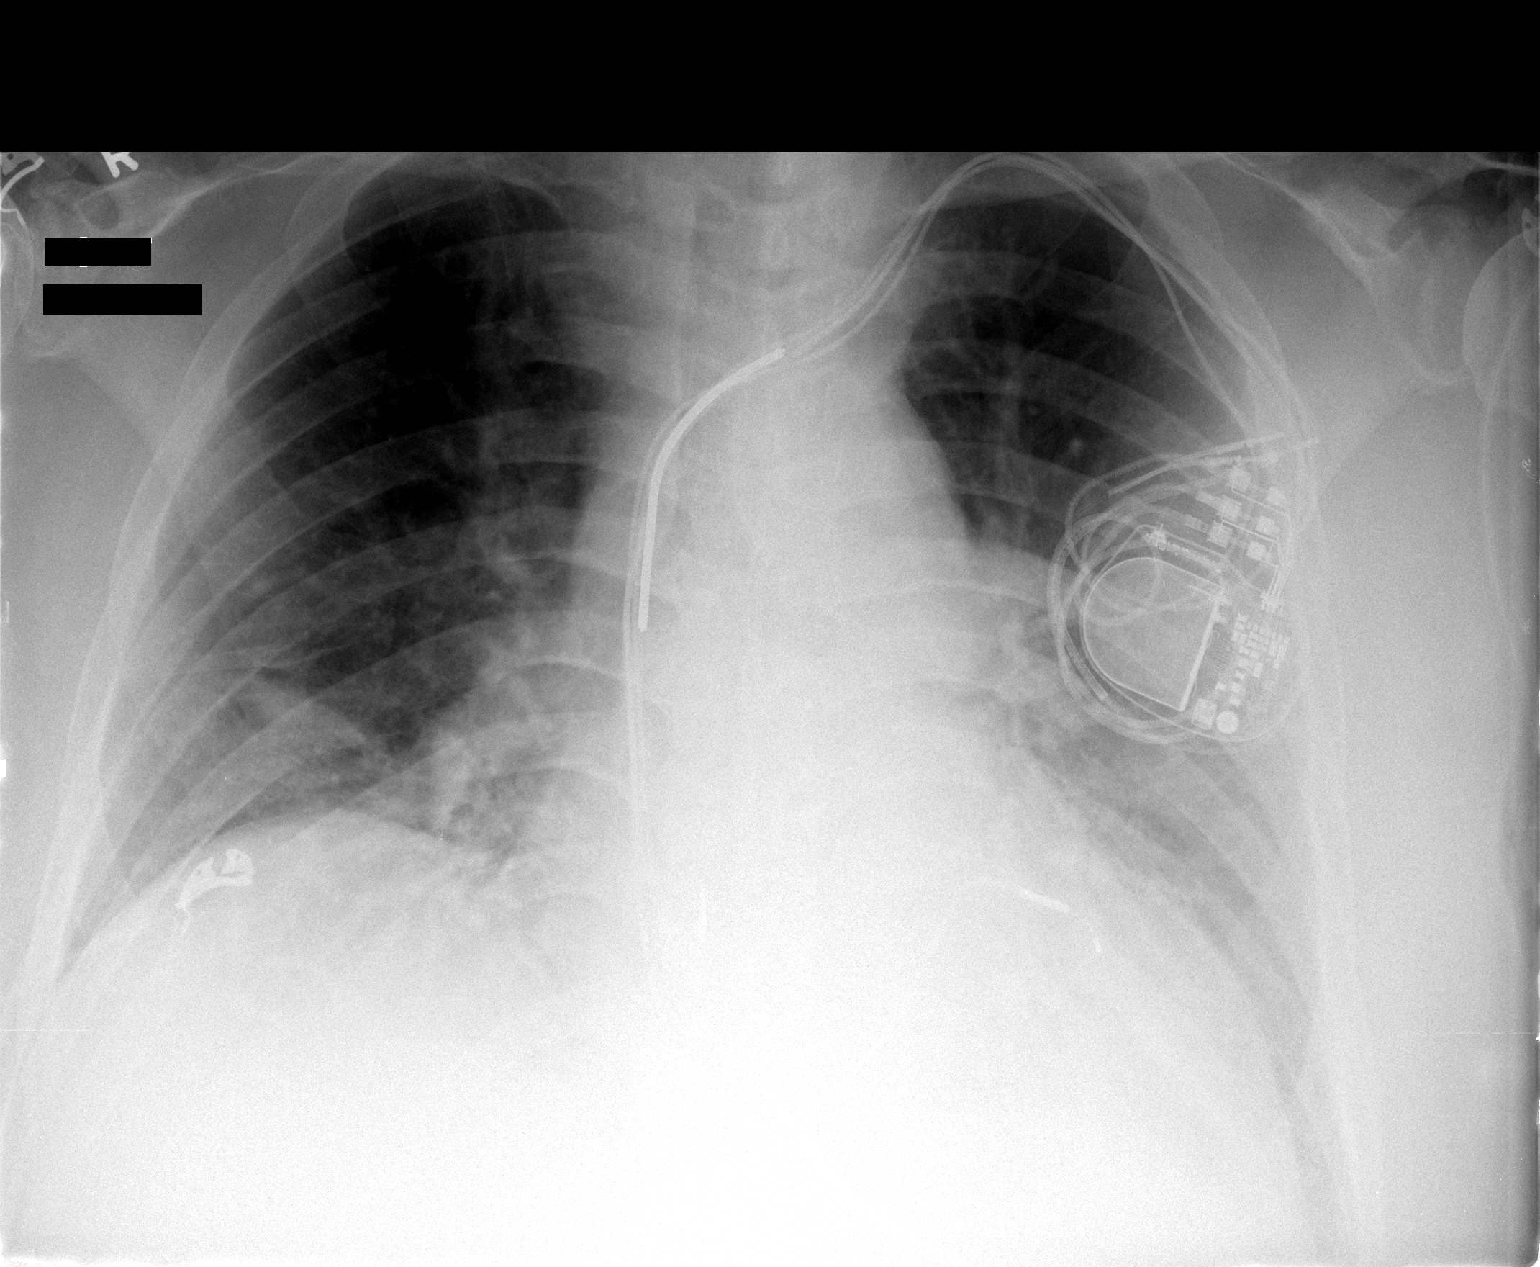

[1 of 1 positions shown; findings below may reference images not displayed]

FINDINGS: Left AICD again noted.  Moderate enlargement
cardiomediastinal silhouette is slightly more prominent than on the
prior study, with central vascular congestion and hazy ill-defined
perihilar airspace opacities.  Curvilinear right lower lobe
probable atelectasis noted.  No pneumothorax.
IMPRESSION: Mildly increased enlargement of the cardiomediastinal silhouette
with ill-defined central pulmonary airspace opacities, which could
represent early edema.

## 2012-10-30 NOTE — Telephone Encounter (Signed)
Talked with pt and he states he was not placed on Prednisone tablets but he was placed on Prednisone opth drops

## 2012-10-30 NOTE — Telephone Encounter (Signed)
Discussed with Dr Ladona Ridgel he can hold Coumadin 3 days prior to extraction and restart the night after the extraction is done .  I have spoken with the patient and let him know the above.  Coumadin clinic aware

## 2012-11-17 ENCOUNTER — Ambulatory Visit (INDEPENDENT_AMBULATORY_CARE_PROVIDER_SITE_OTHER): Payer: Medicare Other | Admitting: *Deleted

## 2012-11-17 DIAGNOSIS — I4891 Unspecified atrial fibrillation: Secondary | ICD-10-CM

## 2012-11-17 DIAGNOSIS — Z7901 Long term (current) use of anticoagulants: Secondary | ICD-10-CM

## 2012-11-17 LAB — POCT INR: INR: 2.2

## 2012-11-18 ENCOUNTER — Other Ambulatory Visit (HOSPITAL_COMMUNITY): Payer: Self-pay | Admitting: Adult Health

## 2012-12-01 ENCOUNTER — Ambulatory Visit (INDEPENDENT_AMBULATORY_CARE_PROVIDER_SITE_OTHER): Payer: Medicare Other | Admitting: *Deleted

## 2012-12-01 DIAGNOSIS — Z7901 Long term (current) use of anticoagulants: Secondary | ICD-10-CM

## 2012-12-01 DIAGNOSIS — I4891 Unspecified atrial fibrillation: Secondary | ICD-10-CM

## 2012-12-11 ENCOUNTER — Ambulatory Visit (INDEPENDENT_AMBULATORY_CARE_PROVIDER_SITE_OTHER): Payer: Medicare Other | Admitting: *Deleted

## 2012-12-11 DIAGNOSIS — Z7901 Long term (current) use of anticoagulants: Secondary | ICD-10-CM

## 2012-12-11 DIAGNOSIS — I4891 Unspecified atrial fibrillation: Secondary | ICD-10-CM

## 2013-01-01 ENCOUNTER — Ambulatory Visit (INDEPENDENT_AMBULATORY_CARE_PROVIDER_SITE_OTHER): Payer: Medicare Other | Admitting: *Deleted

## 2013-01-01 DIAGNOSIS — I4891 Unspecified atrial fibrillation: Secondary | ICD-10-CM

## 2013-01-01 DIAGNOSIS — Z7901 Long term (current) use of anticoagulants: Secondary | ICD-10-CM

## 2013-01-26 ENCOUNTER — Ambulatory Visit (INDEPENDENT_AMBULATORY_CARE_PROVIDER_SITE_OTHER): Payer: Medicare Other

## 2013-01-26 ENCOUNTER — Other Ambulatory Visit: Payer: Self-pay | Admitting: Cardiovascular Disease

## 2013-01-26 DIAGNOSIS — Z7901 Long term (current) use of anticoagulants: Secondary | ICD-10-CM

## 2013-01-26 DIAGNOSIS — I4891 Unspecified atrial fibrillation: Secondary | ICD-10-CM

## 2013-01-26 LAB — POCT INR: INR: 2.3

## 2013-02-23 ENCOUNTER — Ambulatory Visit (INDEPENDENT_AMBULATORY_CARE_PROVIDER_SITE_OTHER): Payer: Medicare Other | Admitting: *Deleted

## 2013-02-23 DIAGNOSIS — Z7901 Long term (current) use of anticoagulants: Secondary | ICD-10-CM

## 2013-02-23 DIAGNOSIS — I4891 Unspecified atrial fibrillation: Secondary | ICD-10-CM

## 2013-03-15 ENCOUNTER — Other Ambulatory Visit (HOSPITAL_COMMUNITY): Payer: Self-pay | Admitting: *Deleted

## 2013-03-15 MED ORDER — DIGOXIN 125 MCG PO TABS: 0.1250 mg | ORAL_TABLET | Freq: Every day | ORAL | Status: DC

## 2013-03-16 ENCOUNTER — Encounter: Payer: Self-pay | Admitting: Cardiology

## 2013-03-16 ENCOUNTER — Ambulatory Visit (INDEPENDENT_AMBULATORY_CARE_PROVIDER_SITE_OTHER): Payer: Medicare Other | Admitting: Cardiology

## 2013-03-16 VITALS — BP 108/68 | HR 77 | Ht 75.0 in | Wt 267.8 lb

## 2013-03-16 DIAGNOSIS — Z950 Presence of cardiac pacemaker: Secondary | ICD-10-CM

## 2013-03-16 DIAGNOSIS — I5022 Chronic systolic (congestive) heart failure: Secondary | ICD-10-CM

## 2013-03-16 DIAGNOSIS — I428 Other cardiomyopathies: Secondary | ICD-10-CM

## 2013-03-16 DIAGNOSIS — I4891 Unspecified atrial fibrillation: Secondary | ICD-10-CM

## 2013-03-16 LAB — PACEMAKER DEVICE OBSERVATION
AL AMPLITUDE: 1.2 mv
BAMS-0001: 150 {beats}/min
DEVICE MODEL PM: 2428292

## 2013-03-16 NOTE — Patient Instructions (Addendum)
Your physician recommends that you  follow-up with Gunnar Fusi and Belenda Cruise on 06/17/2013 @ 11:00 am.  Your physician recommends that you continue on your current medications as directed. Please refer to the Current Medication list given to you today.

## 2013-03-16 NOTE — Progress Notes (Signed)
ELECTROPHYSIOLOGY OFFICE NOTE  Patient ID: [name] MRN: [id number], DOB/AGE: Aug 26, 1941   Date of Visit: 03/16/2013  Primary Physician: Lendon Ka, DDS Primary Cardiologist / EP: [name], MD / Ladona Ridgel, MD Reason for Visit: EP/device follow-up  History of Present Illness  [name] is a pleasant 72 year old man with a NICM, chronic systolic HF s/p CRT-P, nonobstructive CAD and permanent AF who presents today for routine electrophysiology followup. Since last being seen in our clinic, he reports he is doing well. He has no complaints. Today, he denies chest pain or shortness of breath. He denies palpitations, dizziness, near syncope or syncope. He denies LE swelling, orthopnea, PND or recent weight gain. He denies ICD shocks. [name] reports that he is compliant and tolerating medications without difficulty.  Past Medical History Past Medical History  Diagnosis Date  . Obesities, morbid   . Hypertension   . Hyperlipidemia   . Diabetes mellitus   . Polycythemia     vera requiring phlebotomy and treatment with hydroxyurea on a chronic bais. [facility]  . CHF (congestive heart failure)     secondary to nonischemic cardiomyopathy. EF initially 20-25% up to 50% in 2008. Echo 2/12 EF 20-25% . RHC 3/12: RA 21  RV 81/16/24  PA 81/36 (56) PCWP 33 (v to 43) PA sat 43% 48%.status post Medtronic biventricular ICD placement.(with Sprint Fidelis leads)-explanted--now with St.Jude Anthem BivPM   . 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 Atrial fibrillation     Chronic. On coumadin.  . Coronary artery disease, non-occlusive     Cath 3/12: mLAD 70%, m-dLAD 40%; Dx 60%; mCFX 30-40%; pRCA 30%)    Past Surgical History Past Surgical History  Procedure Laterality Date  . Cardiac catheterization    . Insert / replace / remove pacemaker      ICD - St. Jude    Allergies/Intolerances No Known Allergies  Current Home  Medications Current Outpatient Prescriptions  Medication Sig Dispense Refill  . acarbose (PRECOSE) 50 MG tablet Take 50 mg by mouth. Take 1/2 tablet 3 times daily       . allopurinol (ZYLOPRIM) 300 MG tablet Take 300 mg by mouth daily.        Marland Kitchen aspirin 81 MG tablet Take 81 mg by mouth daily.        . benazepril (LOTENSIN) 20 MG tablet Take 10 mg by mouth 2 (two) times daily.       . carvedilol (COREG) 25 MG tablet Take 2 tablets (50 mg total) by mouth 2 (two) times daily.  360 tablet  3  . digoxin (LANOXIN) 0.125 MG tablet Take 1 tablet (0.125 mg total) by mouth daily.  30 tablet  2  . furosemide (LASIX) 20 MG tablet Take 1 tablet (20 mg total) by mouth 2 (two) times daily.  120 tablet  5  . glipiZIDE (GLUCOTROL) 10 MG tablet Take 10 mg by mouth 2 (two) times daily.        . Glucose Blood (BAYER BREEZE 2 TEST) DISK by In Vitro route. Test sugar 3 times a day       . hydrALAZINE (APRESOLINE) 50 MG tablet TAKE 1 TABLET BY MOUTH 3 TIMES A DAY  90 tablet  6  . HYDROcodone-acetaminophen (VICODIN) 5-500 MG per tablet Take 2 tablets by mouth every 6 (six) hours as needed.        . hydroxyurea (HYDREA) 500 MG  capsule Take 500 mg by mouth 3 (three) times daily. May take with food to minimize GI side effects.       . insulin NPH (HUMULIN N,NOVOLIN N) 100 UNIT/ML injection Inject 18-22 Units into the skin. Take18units at night and 22 units in the morning.      Marland Kitchen KLOR-CON M20 20 MEQ tablet TAKE 1 TABLET BY MOUTH TWICE A DAY  60 tablet  6  . metoCLOPramide (REGLAN) 10 MG tablet Take 10 mg by mouth every 6 (six) hours as needed.        . naproxen sodium (ANAPROX) 220 MG tablet Take 220 mg by mouth 2 (two) times daily with a meal.      . sildenafil (VIAGRA) 100 MG tablet Take 1 tablet (100 mg total) by mouth daily as needed for erectile dysfunction.  10 tablet  3  . sildenafil (VIAGRA) 100 MG tablet Take 100 mg by mouth daily as needed.      . simvastatin (ZOCOR) 20 MG tablet Take 1 tablet (20 mg total) by  mouth at bedtime.  30 tablet  11  . terazosin (HYTRIN) 2 MG capsule Take 2 mg by mouth at bedtime as needed. As needed for help with urination      . warfarin (COUMADIN) 6 MG tablet Take as directed by coumadin clinic  40 tablet  3   No current facility-administered medications for this visit.   Social History Social History  . Marital Status: Married   Social History Main Topics  . Smoking status: Former Games developer  . Smokeless tobacco: No  . Alcohol Use: No  . Drug Use: No   Review of Systems General: No chills, fever, night sweats or weight changes Cardiovascular: No chest pain, dyspnea on exertion, edema, orthopnea, palpitations, paroxysmal nocturnal dyspnea Dermatological: No rash, lesions or masses Respiratory: No cough, dyspnea Urologic: No hematuria, dysuria Abdominal: No nausea, vomiting, diarrhea, bright red blood per rectum, melena, or hematemesis Neurologic: No visual changes, weakness, changes in mental status All other systems reviewed and are otherwise negative except as noted above.  Physical Exam Blood pressure 108/68, pulse 77, height 6\' 3"  (1.905 m), weight 267 lb 12.8 oz (121.473 kg).  General: Well developed, well appearing 72 year old male in no acute distress. HEENT: Normocephalic, atraumatic. EOMs intact. Sclera nonicteric. Oropharynx clear.  Neck: Supple without bruits. No JVD. Lungs: Respirations regular and unlabored, CTA bilaterally. No wheezes, rales or rhonchi. Heart: RRR. S1, S2 present. No murmurs, rub, S3 or S4. Abdomen: Soft, non-distended.  Extremities: No clubbing, cyanosis or edema. DP/PT/Radials 2+ and equal bilaterally. Psych: Normal affect. Neuro: Alert and oriented X 3. Moves all extremities spontaneously.   Diagnostics Device interrogation today - Normal device function. Thresholds, sensing, impedance consistent with previous measurements. Histograms appropriate for patient and level of activity. 1 ventricular high rate episode, EGM most  consistent with monomorphic VT 20 beats in duration, asymptomatic. Patient bi-ventricularly pacing 96% of the time. Device programmed with appropriate safety margins. Device heart failure diagnostics are within normal limits and stable over time. Estimated longevity 4.7 - 5 years.  Assessment and Plan 1. NICM s/p CRT-P implant Normal device function No programming changes made Continue routine device checks in device clinic every 3 months Return to clinic for follow-up with Dr. Ladona Ridgel in 6 months 2. Chronic systolic HF Stable; euvolemic by exam today Continue medical therapy with carvedilol and ACEI Keep scheduled follow-up with Dr. [name] 3. Permanent AF Stable; rate controlled; histograms reviewed and are stable Continue  warfarin for embolic prophylaxis  Signed, Chad Thammavong, PA-C 03/16/2013, 11:03 AM

## 2013-04-05 ENCOUNTER — Other Ambulatory Visit: Payer: Self-pay | Admitting: *Deleted

## 2013-04-05 MED ORDER — WARFARIN SODIUM 6 MG PO TABS: ORAL_TABLET | ORAL | Status: DC

## 2013-04-06 ENCOUNTER — Ambulatory Visit (INDEPENDENT_AMBULATORY_CARE_PROVIDER_SITE_OTHER): Payer: Medicare Other | Admitting: *Deleted

## 2013-04-06 DIAGNOSIS — Z7901 Long term (current) use of anticoagulants: Secondary | ICD-10-CM

## 2013-04-06 DIAGNOSIS — I4891 Unspecified atrial fibrillation: Secondary | ICD-10-CM

## 2013-04-06 LAB — POCT INR: INR: 2.6

## 2013-04-06 MED ORDER — WARFARIN SODIUM 6 MG PO TABS: ORAL_TABLET | ORAL | Status: DC

## 2013-05-04 ENCOUNTER — Encounter: Payer: Self-pay | Admitting: Internal Medicine

## 2013-05-18 ENCOUNTER — Ambulatory Visit (INDEPENDENT_AMBULATORY_CARE_PROVIDER_SITE_OTHER): Payer: Medicare Other | Admitting: *Deleted

## 2013-05-18 DIAGNOSIS — I4891 Unspecified atrial fibrillation: Secondary | ICD-10-CM

## 2013-05-18 DIAGNOSIS — Z7901 Long term (current) use of anticoagulants: Secondary | ICD-10-CM

## 2013-05-18 LAB — POCT INR: INR: 2.7

## 2013-05-27 ENCOUNTER — Other Ambulatory Visit (HOSPITAL_COMMUNITY): Payer: Self-pay | Admitting: *Deleted

## 2013-05-27 MED ORDER — FUROSEMIDE 20 MG PO TABS: 20.0000 mg | ORAL_TABLET | Freq: Two times a day (BID) | ORAL | Status: DC

## 2013-06-04 ENCOUNTER — Other Ambulatory Visit (HOSPITAL_COMMUNITY): Payer: Self-pay | Admitting: Adult Health

## 2013-06-07 ENCOUNTER — Other Ambulatory Visit (HOSPITAL_COMMUNITY): Payer: Self-pay | Admitting: *Deleted

## 2013-06-07 MED ORDER — DIGOXIN 125 MCG PO TABS: 0.1250 mg | ORAL_TABLET | Freq: Every day | ORAL | Status: DC

## 2013-06-16 ENCOUNTER — Other Ambulatory Visit (HOSPITAL_COMMUNITY): Payer: Self-pay | Admitting: Internal Medicine

## 2013-06-16 ENCOUNTER — Encounter (HOSPITAL_COMMUNITY): Payer: Self-pay | Admitting: *Deleted

## 2013-06-17 ENCOUNTER — Ambulatory Visit (INDEPENDENT_AMBULATORY_CARE_PROVIDER_SITE_OTHER): Payer: Medicare Other | Admitting: *Deleted

## 2013-06-17 DIAGNOSIS — Z950 Presence of cardiac pacemaker: Secondary | ICD-10-CM

## 2013-06-17 DIAGNOSIS — I4891 Unspecified atrial fibrillation: Secondary | ICD-10-CM

## 2013-06-17 DIAGNOSIS — I5022 Chronic systolic (congestive) heart failure: Secondary | ICD-10-CM

## 2013-06-17 DIAGNOSIS — Z7901 Long term (current) use of anticoagulants: Secondary | ICD-10-CM

## 2013-06-17 LAB — PACEMAKER DEVICE OBSERVATION
AL IMPEDENCE PM: 462.5 Ohm
BATTERY VOLTAGE: 2.9178 V
RV LEAD THRESHOLD: 0.75 V

## 2013-06-17 MED ORDER — WARFARIN SODIUM 6 MG PO TABS: ORAL_TABLET | ORAL | Status: DC

## 2013-06-17 NOTE — Progress Notes (Signed)
SJM CRT-P check in clinic. All functions normal, Bi-V pacing 99% of time. Changed LV pulse width from 0.35ms to 0.27ms, no other changes made. Full details in PaceArt.   13 Atrial noise reversions--all appear to be high amplitude A-fib not actual noise.  ROV w/ Dr. Ladona Ridgel 09/21/13 @ 9:30.

## 2013-07-07 ENCOUNTER — Other Ambulatory Visit (HOSPITAL_COMMUNITY): Payer: Self-pay | Admitting: *Deleted

## 2013-07-07 MED ORDER — DIGOXIN 125 MCG PO TABS: 0.1250 mg | ORAL_TABLET | Freq: Every day | ORAL | Status: DC

## 2013-07-12 ENCOUNTER — Other Ambulatory Visit (HOSPITAL_COMMUNITY): Payer: Self-pay | Admitting: *Deleted

## 2013-07-12 MED ORDER — SIMVASTATIN 20 MG PO TABS: 20.0000 mg | ORAL_TABLET | Freq: Every day | ORAL | Status: DC

## 2013-07-13 ENCOUNTER — Ambulatory Visit (HOSPITAL_COMMUNITY)
Admission: RE | Admit: 2013-07-13 | Discharge: 2013-07-13 | Disposition: A | Discharge status: Home / Self Care | Payer: Medicare Other | Source: Ambulatory Visit | Attending: Cardiology | Admitting: Cardiology

## 2013-07-13 VITALS — BP 128/72 | HR 84 | Wt 247.5 lb

## 2013-07-13 DIAGNOSIS — I4891 Unspecified atrial fibrillation: Secondary | ICD-10-CM

## 2013-07-13 DIAGNOSIS — I10 Essential (primary) hypertension: Secondary | ICD-10-CM | POA: Insufficient documentation

## 2013-07-13 DIAGNOSIS — D45 Polycythemia vera: Secondary | ICD-10-CM | POA: Insufficient documentation

## 2013-07-13 DIAGNOSIS — Z950 Presence of cardiac pacemaker: Secondary | ICD-10-CM

## 2013-07-13 DIAGNOSIS — Z7982 Long term (current) use of aspirin: Secondary | ICD-10-CM | POA: Insufficient documentation

## 2013-07-13 DIAGNOSIS — I2510 Atherosclerotic heart disease of native coronary artery without angina pectoris: Secondary | ICD-10-CM

## 2013-07-13 DIAGNOSIS — Z79899 Other long term (current) drug therapy: Secondary | ICD-10-CM | POA: Insufficient documentation

## 2013-07-13 DIAGNOSIS — I428 Other cardiomyopathies: Secondary | ICD-10-CM | POA: Insufficient documentation

## 2013-07-13 DIAGNOSIS — E785 Hyperlipidemia, unspecified: Secondary | ICD-10-CM | POA: Insufficient documentation

## 2013-07-13 DIAGNOSIS — Z794 Long term (current) use of insulin: Secondary | ICD-10-CM | POA: Insufficient documentation

## 2013-07-13 DIAGNOSIS — I5022 Chronic systolic (congestive) heart failure: Secondary | ICD-10-CM

## 2013-07-13 DIAGNOSIS — E119 Type 2 diabetes mellitus without complications: Secondary | ICD-10-CM | POA: Insufficient documentation

## 2013-07-13 DIAGNOSIS — Z7901 Long term (current) use of anticoagulants: Secondary | ICD-10-CM | POA: Insufficient documentation

## 2013-07-13 DIAGNOSIS — I509 Heart failure, unspecified: Secondary | ICD-10-CM | POA: Insufficient documentation

## 2013-07-13 LAB — BASIC METABOLIC PANEL
BUN: 11 mg/dL (ref 6–23)
GFR calc non Af Amer: 68 mL/min — ABNORMAL LOW (ref >90)
Glucose, Bld: 164 mg/dL — ABNORMAL HIGH (ref 70–99)
Potassium: 3.9 mEq/L (ref 3.5–5.1)

## 2013-07-13 LAB — LIPID PANEL
HDL: 37 mg/dL — ABNORMAL LOW (ref >39)
LDL Cholesterol: 25 mg/dL (ref 0–99)
Total CHOL/HDL Ratio: 2.2 RATIO
Triglycerides: 89 mg/dL (ref <150)
VLDL: 18 mg/dL (ref 0–40)

## 2013-07-13 NOTE — Patient Instructions (Addendum)
You can stop taking your aspirin.  Follow up 3 months with ECHO.  Call any issues.  Do the following things EVERYDAY: 1) Weigh yourself in the morning before breakfast. Write it down and keep it in a log. 2) Take your medicines as prescribed 3) Eat low salt foods-Limit salt (sodium) to 2000 mg per day.  4) Stay as active as you can everyday 5) Limit all fluids for the day to less than 2 liters 6)

## 2013-07-13 NOTE — Progress Notes (Signed)
Patient ID: [name], male   DOB: 1941-03-01, 72 y.o.   MRN: [id number]  HPI:  [name] is a 71 yo male with a h/o moderate nonobstructive CAD, nonischemic cardiomyopathy with CHF, HTN, atrial arrythmias (now chronic AF) and obesity.  He initially had an EF of 20-25% and single chamber ICD was implanted. EF then recovered to 50% (echo 2008) and had ICD explanted and BiV pacer placed due to bradycardia.  Had routine follow-up echo in 08/09/10 and showed EF back down to 25-30%. Medical therapy intensified but f/u echo in 12/06/10 showed worsening EF of 20-25%. So patient underwent R&L cath on 12/21/10  Cath results: 1) Mod nonobs CAD (mLAD 70%, m-dLAD 40%; oDx 60%; mCFX 30-40%; pRCA 30%)  2) RA 21  RV 81/16/24  PA 81/36 (56) PCWP 33 (v to 43) PA sat 43% 48%, Fick 4.2/1.6  After cath admitted for diuresis and titration of hydralazine/nitrates.  Nitrates stopped so he could restart sildenafil.    Follow up: Doing well. Denies SOB, orthopnea, CP or edema. Talking all medications as prescribed. Weight at home 247-250 lbs. Walking with walker. Seeing [facility] in [place], [place], and [place] with walker due to low back pain.   FH: No premature CAD or family history of dilated CMP.   ROS: All systems negative except as listed in HPI, PMH and Problem List.  Past Medical History  Diagnosis Date  . Obesities, morbid   . Hypertension   . Hyperlipidemia   . Diabetes mellitus   . Polycythemia     vera requiring phlebotomy and treatment with hydroxyurea on a chronic bais. [facility]  . CHF (congestive heart failure)     secondary to nonischemic cardiomyopathy. EF initially 20-25% up to 50% in 2008. Echo 2/12 EF 20-25% . RHC 3/12: RA 21  RV 81/16/24  PA 81/36 (56) PCWP 33 (v to 43) PA sat 43% 48%.status post Medtronic biventricular ICD placement.(with Sprint Fidelis leads)-explanted--now with St.Jude Anthem BivPM   . 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 Atrial fibrillation     Chronic. On coumadin.  . Coronary artery disease, non-occlusive     Cath 3/12: mLAD 70%, m-dLAD 40%; Dx 60%; mCFX 30-40%; pRCA 30%)    Current Outpatient Prescriptions  Medication Sig Dispense Refill  . acarbose (PRECOSE) 50 MG tablet Take 50 mg by mouth. Take 1/2 tablet 3 times daily       . allopurinol (ZYLOPRIM) 300 MG tablet Take 300 mg by mouth daily.        Marland Kitchen aspirin 81 MG tablet Take 81 mg by mouth daily.        . benazepril (LOTENSIN) 20 MG tablet Take 10 mg by mouth 2 (two) times daily.       Marland Kitchen glipiZIDE (GLUCOTROL) 10 MG tablet Take 10 mg by mouth 2 (two) times daily.        . Glucose Blood (BAYER BREEZE 2 TEST) DISK by In Vitro route. Test sugar 3 times a day       . HYDROcodone-acetaminophen (VICODIN) 5-500 MG per tablet Take 2 tablets by mouth every 6 (six) hours as needed.        . hydroxyurea (HYDREA) 500 MG capsule Take 500 mg by mouth 3 (three) times daily. May take with food to minimize GI side effects.       . insulin NPH (HUMULIN N,NOVOLIN N) 100 UNIT/ML injection Inject 18-22  Units into the skin. Take18units at night and 22 units in the morning.      Marland Kitchen KLOR-CON M20 20 MEQ tablet TAKE 1 TABLET BY MOUTH TWICE A DAY  60 tablet  6  . metoCLOPramide (REGLAN) 10 MG tablet Take 10 mg by mouth every 6 (six) hours as needed.        . naproxen sodium (ANAPROX) 220 MG tablet Take 220 mg by mouth 2 (two) times daily with a meal.      . sildenafil (VIAGRA) 100 MG tablet Take 1 tablet (100 mg total) by mouth daily as needed for erectile dysfunction.  10 tablet  3  . simvastatin (ZOCOR) 20 MG tablet Take 1 tablet (20 mg total) by mouth at bedtime.  30 tablet  11  . terazosin (HYTRIN) 2 MG capsule Take 2 mg by mouth at bedtime as needed. As needed for help with urination      . warfarin (COUMADIN) 6 MG tablet Take as directed by coumadin clinic  40 tablet  3  . carvedilol (COREG) 25 MG tablet TAKE 2 TABLETS BY MOUTH TWICE A DAY  360  tablet  0  . digoxin (LANOXIN) 0.125 MG tablet Take 1 tablet (0.125 mg total) by mouth daily.  30 tablet  1  . furosemide (LASIX) 20 MG tablet Take 1 tablet (20 mg total) by mouth 2 (two) times daily.  60 tablet  1  . hydrALAZINE (APRESOLINE) 50 MG tablet TAKE 1 TABLET BY MOUTH 3 TIMES A DAY  90 tablet  1   No current facility-administered medications for this encounter.    Filed Vitals:   07/13/13 1330  BP: 128/72  Pulse: 84  Weight: 247 lb 8 oz (112.265 kg)  SpO2: 97%   PHYSICAL EXAM: General:  Well appearing. No resp difficulty; walks with walker HEENT: normal Neck: supple. JVP flat. Carotids 2+ bilaterally; no bruits. No lymphadenopathy or thryomegaly appreciated. Cor: PMI laterally displaced. Irregular rate & rhythm. No rubs, gallops or murmurs. Lungs: clear Abdomen: obese soft, nontender, nondistended. No hepatosplenomegaly. No bruits or masses. Good bowel sounds. Extremities: no cyanosis, clubbing, rash, edema Neuro: alert & orientedx3, cranial nerves grossly intact. Moves all 4 extremities. Affect pleasant.  ASSESSMENT & PLAN:  1) Chronic systolic HF: Nonischemic cardiomyopathy (nonobstructive CAD unlikely to be primary cause of CMP). ECHO EF 20-25% (2011). [name] has not been seen in the HF clinic in over a year and has been followed by the [place]. He reports he has been doing well and that the [place] has been checking his labs. His EF originally improved to 50% in 2008 and had his ICD explanted and BI-V pacer placed. He has NYHA II symptoms and is BiV pacing 96% of the time. As above, repeat echo showed that EF had declined again (in 2011).  He is not volume overloaded on exam.  - Continue coreg 25 mg BID goal dose, benazepril 10 mg BID and digoxin 0.125 mcg. He is on hydralazine TID, however no IMDUR as he uses Viagra. - Reinforced the need and importance of daily weights, a low sodium diet, and fluid restriction (less than 2 L a day). Instructed to call the HF clinic if weight  increases more than 3 lbs overnight or 5 lbs in a week.  - Will get BMET and digoxin level today. - Repeat echo to assess LV systolic function.  He has been on a good medical regimen for a while.  If EF remains low, he likely should be re-implanted  with ICD.  - Hold off on spironolactone use for now, will start if EF is low on echo.   2) Chronic A-fib: Rate-controlled. Continue BB and Coumadin. Can stop ASA  3) CAD: Moderate nonobstructive CAD on prior cath.  No ASA as he is on warfarin and CAD seems stable.  Would continue simvastatin and check lipids today.   F/U 3 months  Ulla Potash B NP-C 4:14 PM  Patient seen with NP, agree with the above note.  Nonischemic dilated cardiomyopathy with prior improvement in EF followed by worsening.  He has not been seen here in a while.  He has been doing well symptomatically and is on a good regimen.  Will get echo (as above) to assess for ICD need.   Marca Ancona 07/13/2013

## 2013-07-15 ENCOUNTER — Encounter: Payer: Self-pay | Admitting: Internal Medicine

## 2013-07-20 ENCOUNTER — Telehealth: Payer: Self-pay

## 2013-07-20 NOTE — Telephone Encounter (Signed)
Pt called reports falling yesterday hit elbow and now it is black and blue.  Pt denies hitting head in fall. Advised pt bruising to be expected from hitting elbow in fall.  Advised pt to continue to monitor bruising, call if bruising worsens for sooner INR check.  Advised pt to alternate heat and cold to promote healing, decrease inflammation and promote reabsorption of blood in subcutaneous tissues.  Pt verbalized understanding.

## 2013-07-21 ENCOUNTER — Other Ambulatory Visit (HOSPITAL_COMMUNITY): Payer: Self-pay | Admitting: Internal Medicine

## 2013-07-29 ENCOUNTER — Ambulatory Visit (HOSPITAL_COMMUNITY): Payer: Medicare Other | Attending: Cardiology

## 2013-07-29 ENCOUNTER — Ambulatory Visit (INDEPENDENT_AMBULATORY_CARE_PROVIDER_SITE_OTHER): Payer: Medicare Other | Admitting: General Practice

## 2013-07-29 ENCOUNTER — Other Ambulatory Visit (HOSPITAL_COMMUNITY): Payer: Self-pay | Admitting: Cardiology

## 2013-07-29 DIAGNOSIS — I079 Rheumatic tricuspid valve disease, unspecified: Secondary | ICD-10-CM | POA: Insufficient documentation

## 2013-07-29 DIAGNOSIS — Z7901 Long term (current) use of anticoagulants: Secondary | ICD-10-CM

## 2013-07-29 DIAGNOSIS — I509 Heart failure, unspecified: Secondary | ICD-10-CM | POA: Insufficient documentation

## 2013-07-29 DIAGNOSIS — I4891 Unspecified atrial fibrillation: Secondary | ICD-10-CM | POA: Insufficient documentation

## 2013-07-29 DIAGNOSIS — I080 Rheumatic disorders of both mitral and aortic valves: Secondary | ICD-10-CM | POA: Insufficient documentation

## 2013-07-29 DIAGNOSIS — I428 Other cardiomyopathies: Secondary | ICD-10-CM | POA: Insufficient documentation

## 2013-07-29 DIAGNOSIS — I10 Essential (primary) hypertension: Secondary | ICD-10-CM | POA: Insufficient documentation

## 2013-07-29 DIAGNOSIS — I2510 Atherosclerotic heart disease of native coronary artery without angina pectoris: Secondary | ICD-10-CM | POA: Insufficient documentation

## 2013-07-29 DIAGNOSIS — E669 Obesity, unspecified: Secondary | ICD-10-CM | POA: Insufficient documentation

## 2013-07-29 NOTE — Progress Notes (Signed)
Echocardiogram performed.  

## 2013-07-30 ENCOUNTER — Telehealth (HOSPITAL_COMMUNITY): Payer: Self-pay | Admitting: Cardiology

## 2013-07-30 ENCOUNTER — Other Ambulatory Visit (HOSPITAL_COMMUNITY): Payer: Self-pay | Admitting: Internal Medicine

## 2013-07-30 DIAGNOSIS — I5022 Chronic systolic (congestive) heart failure: Secondary | ICD-10-CM

## 2013-07-30 NOTE — Telephone Encounter (Signed)
Message copied by JEFFRIES, Milagros Reap on Fri Jul 30, 2013  3:56 PM ------      Message from: Laurey Morale      Created: Fri Jul 30, 2013  9:55 AM       EF remains low, should have evaluation for ICD. ------

## 2013-08-09 ENCOUNTER — Other Ambulatory Visit: Payer: Self-pay | Admitting: Cardiovascular Disease

## 2013-08-18 ENCOUNTER — Other Ambulatory Visit (HOSPITAL_COMMUNITY): Payer: Self-pay | Admitting: *Deleted

## 2013-08-18 MED ORDER — SILDENAFIL CITRATE 100 MG PO TABS: 100.0000 mg | ORAL_TABLET | Freq: Every day | ORAL | Status: DC | PRN

## 2013-08-24 ENCOUNTER — Encounter: Payer: Self-pay | Admitting: Internal Medicine

## 2013-08-24 ENCOUNTER — Ambulatory Visit (INDEPENDENT_AMBULATORY_CARE_PROVIDER_SITE_OTHER): Payer: Medicare Other | Admitting: Internal Medicine

## 2013-08-24 VITALS — BP 141/82 | HR 73 | Ht 75.0 in | Wt 273.8 lb

## 2013-08-24 DIAGNOSIS — Z950 Presence of cardiac pacemaker: Secondary | ICD-10-CM

## 2013-08-24 DIAGNOSIS — I10 Essential (primary) hypertension: Secondary | ICD-10-CM

## 2013-08-24 DIAGNOSIS — I5022 Chronic systolic (congestive) heart failure: Secondary | ICD-10-CM

## 2013-08-24 DIAGNOSIS — I4891 Unspecified atrial fibrillation: Secondary | ICD-10-CM

## 2013-08-24 LAB — MDC_IDC_ENUM_SESS_TYPE_INCLINIC
Battery Voltage: 2.9 V
Brady Statistic RA Percent Paced: 2.2 %
Implantable Pulse Generator Serial Number: 2428292
Lead Channel Impedance Value: 387.5 Ohm
Lead Channel Impedance Value: 425 Ohm
Lead Channel Impedance Value: 650 Ohm
Lead Channel Pacing Threshold Amplitude: 0.625 V
Lead Channel Pacing Threshold Amplitude: 0.75 V
Lead Channel Pacing Threshold Pulse Width: 0.7 ms
Lead Channel Setting Pacing Amplitude: 2 V
Lead Channel Setting Pacing Amplitude: 2 V
Lead Channel Setting Pacing Pulse Width: 0.7 ms
Lead Channel Setting Sensing Sensitivity: 2.5 mV

## 2013-08-24 NOTE — Assessment & Plan Note (Signed)
His blood pressure is reasonably well controlled today. No change in medical therapy.

## 2013-08-24 NOTE — Assessment & Plan Note (Signed)
His St. Jude biventricular pacemaker is working normally. We'll plan to recheck in several months. 

## 2013-08-24 NOTE — Assessment & Plan Note (Signed)
The patient has chronic atrial fibrillation and his ventricular rate is well controlled. No change in medical therapy.

## 2013-08-24 NOTE — Progress Notes (Signed)
HPI [name] returns today for followup. He is a very pleasant 72 year old man with chronic systolic heart failure, atrial fibrillation, a nonischemic cardiomyopathy, status post biventricular pacemaker insertion. He previously had a BiV ICD but because of compliance issues, his device was downgraded to a BiV PM.  The patient denies dietary indiscretion as well as medical noncompliance. In the interim he denies chest pain or sob or peripheral edema.  No Known Allergies   Current Outpatient Prescriptions  Medication Sig Dispense Refill  . acarbose (PRECOSE) 50 MG tablet Take 50 mg by mouth. Take 1/2 tablet 3 times daily       . allopurinol (ZYLOPRIM) 300 MG tablet Take 300 mg by mouth daily.        . benazepril (LOTENSIN) 20 MG tablet Take 10 mg by mouth 2 (two) times daily.       . carvedilol (COREG) 25 MG tablet TAKE 2 TABLETS BY MOUTH TWICE A DAY  360 tablet  0  . digoxin (LANOXIN) 0.125 MG tablet Take 1 tablet (0.125 mg total) by mouth daily.  30 tablet  1  . furosemide (LASIX) 20 MG tablet TAKE 1 TABLET BY MOUTH TWICE A DAY  60 tablet  1  . glipiZIDE (GLUCOTROL) 10 MG tablet Take 10 mg by mouth 2 (two) times daily.        . Glucose Blood (BAYER BREEZE 2 TEST) DISK by In Vitro route. Test sugar 3 times a day       . hydrALAZINE (APRESOLINE) 50 MG tablet TAKE 1 TABLET BY MOUTH 3 TIMES A DAY  90 tablet  3  . HYDROcodone-acetaminophen (VICODIN) 5-500 MG per tablet Take 2 tablets by mouth every 6 (six) hours as needed.        . hydroxyurea (HYDREA) 500 MG capsule Take 500 mg by mouth 3 (three) times daily. May take with food to minimize GI side effects.       . insulin NPH (HUMULIN N,NOVOLIN N) 100 UNIT/ML injection Inject 18-22 Units into the skin. Take18units at night and 22 units in the morning.      . metoCLOPramide (REGLAN) 10 MG tablet Take 10 mg by mouth every 6 (six) hours as needed.        . naproxen sodium (ANAPROX) 220 MG tablet Take 220 mg by mouth 2 (two) times daily with a meal.       . potassium chloride SA (KLOR-CON M20) 20 MEQ tablet TAKE 1 TABLET BY MOUTH TWICE A DAY  60 tablet  6  . sildenafil (VIAGRA) 100 MG tablet Take 1 tablet (100 mg total) by mouth daily as needed for erectile dysfunction.  10 tablet  3  . simvastatin (ZOCOR) 20 MG tablet Take 1 tablet (20 mg total) by mouth at bedtime.  30 tablet  11  . terazosin (HYTRIN) 2 MG capsule Take 2 mg by mouth at bedtime as needed. As needed for help with urination      . warfarin (COUMADIN) 6 MG tablet Take as directed by coumadin clinic  40 tablet  3   No current facility-administered medications for this visit.     Past Medical History  Diagnosis Date  . Obesities, morbid   . Hypertension   . Hyperlipidemia   . Diabetes mellitus   . Polycythemia     vera requiring phlebotomy and treatment with hydroxyurea on a chronic bais. [facility]  . CHF (congestive heart failure)     secondary to nonischemic  cardiomyopathy. EF initially 20-25% up to 50% in 2008. Echo 2/12 EF 20-25% . RHC 3/12: RA 21  RV 81/16/24  PA 81/36 (56) PCWP 33 (v to 43) PA sat 43% 48%.status post Medtronic biventricular ICD placement.(with Sprint Fidelis leads)-explanted--now with St.Jude Anthem BivPM   . 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 Atrial fibrillation     Chronic. On coumadin.  . Coronary artery disease, non-occlusive     Cath 3/12: mLAD 70%, m-dLAD 40%; Dx 60%; mCFX 30-40%; pRCA 30%)    ROS:   All systems reviewed and negative except as noted in the HPI.   Past Surgical History  Procedure Laterality Date  . Cardiac catheterization    . Insert / replace / remove pacemaker      ICD - St. Jude     No family history on file.   History   Social History  . Marital Status: Married    Spouse Name: N/A    Number of Children: N/A  . Years of Education: N/A   Occupational History  . Not on file.   Social History Main Topics  . Smoking status: Former Games developer  .  Smokeless tobacco: Not on file  . Alcohol Use: No  . Drug Use: No  . Sexual Activity: Not on file   Other Topics Concern  . Not on file   Social History Narrative   Retired   Married    Tobacco Use - No   Alcohol Use - No     BP 141/82  Pulse 73  Ht 6\' 3"  (1.905 m)  Wt 273 lb 12.8 oz (124.195 kg)  BMI 34.22 kg/m2  Physical Exam:  Well appearing 72 year old man, NAD HEENT: Unremarkable Neck:  6 cm JVD, no thyromegally Lungs:  Clear with no wheezes, rhonchi, or increased work of breathing. Bilateral rales are present approximately 1/3 the way up. HEART:  Regular rate rhythm, no murmurs, no rubs, no clicks Abd:  soft, positive bowel sounds, no organomegally, no rebound, no guarding Ext:  2 plus pulses, no cyanosis, no clubbing, 1+ peripheral edema bilaterally Skin:  No rashes no nodules Neuro:  CN II through XII intact, motor grossly intact   DEVICE  Normal device function.  See PaceArt for details.   Assess/Plan:

## 2013-08-24 NOTE — Assessment & Plan Note (Signed)
His heart failure symptoms are class II. His ejection fraction remains depressed. He is encouraged to maintain a low-sodium diet and take his medications , both of which he is been notorious to be noncompliant of.

## 2013-08-24 NOTE — Patient Instructions (Signed)
Your physician recommends that you continue on your current medications as directed. Please refer to the Current Medication list given to you today.  Remote monitoring is used to monitor your Pacemaker of ICD from home. This monitoring reduces the number of office visits required to check your device to one time per year. It allows [place] to keep an eye on the functioning of your device to ensure it is working properly. You are scheduled for a device check from home on 11/25/2013. You may send your transmission at any time that day. If you have a wireless device, the transmission will be sent automatically. After your physician reviews your transmission, you will receive a postcard with your next transmission date.  Your physician wants you to follow-up in: 12 months with Dr. Ladona Ridgel. You will receive a reminder letter in the mail two months in advance. If you don't receive a letter, please call our office to schedule the follow-up appointment.

## 2013-08-25 ENCOUNTER — Encounter: Payer: Self-pay | Admitting: Internal Medicine

## 2013-09-06 ENCOUNTER — Other Ambulatory Visit (HOSPITAL_COMMUNITY): Payer: Self-pay | Admitting: Internal Medicine

## 2013-09-07 ENCOUNTER — Other Ambulatory Visit: Payer: Self-pay

## 2013-09-07 MED ORDER — FUROSEMIDE 20 MG PO TABS: ORAL_TABLET | ORAL | Status: DC

## 2013-09-12 ENCOUNTER — Other Ambulatory Visit (HOSPITAL_COMMUNITY): Payer: Self-pay | Admitting: Internal Medicine

## 2013-09-16 ENCOUNTER — Ambulatory Visit (INDEPENDENT_AMBULATORY_CARE_PROVIDER_SITE_OTHER): Payer: Medicare Other | Admitting: *Deleted

## 2013-09-16 DIAGNOSIS — Z7901 Long term (current) use of anticoagulants: Secondary | ICD-10-CM

## 2013-09-16 DIAGNOSIS — I4891 Unspecified atrial fibrillation: Secondary | ICD-10-CM

## 2013-09-16 DIAGNOSIS — Z5181 Encounter for therapeutic drug level monitoring: Secondary | ICD-10-CM

## 2013-09-21 ENCOUNTER — Encounter: Payer: Medicare Other | Admitting: Internal Medicine

## 2013-09-28 ENCOUNTER — Encounter: Payer: Medicare Other | Admitting: Internal Medicine

## 2013-10-28 ENCOUNTER — Other Ambulatory Visit: Payer: Self-pay | Admitting: Internal Medicine

## 2013-10-28 ENCOUNTER — Ambulatory Visit (INDEPENDENT_AMBULATORY_CARE_PROVIDER_SITE_OTHER): Payer: Medicare Other | Admitting: Pharmacist

## 2013-10-28 DIAGNOSIS — Z5181 Encounter for therapeutic drug level monitoring: Secondary | ICD-10-CM

## 2013-10-28 DIAGNOSIS — I4891 Unspecified atrial fibrillation: Secondary | ICD-10-CM

## 2013-10-28 DIAGNOSIS — Z7901 Long term (current) use of anticoagulants: Secondary | ICD-10-CM | POA: Diagnosis not present

## 2013-10-28 LAB — POCT INR: INR: 2.6

## 2013-11-25 ENCOUNTER — Encounter: Payer: Self-pay | Admitting: Internal Medicine

## 2013-11-25 ENCOUNTER — Ambulatory Visit (INDEPENDENT_AMBULATORY_CARE_PROVIDER_SITE_OTHER): Payer: Medicare Other | Admitting: *Deleted

## 2013-11-25 DIAGNOSIS — I4891 Unspecified atrial fibrillation: Secondary | ICD-10-CM | POA: Diagnosis not present

## 2013-11-25 LAB — MDC_IDC_ENUM_SESS_TYPE_REMOTE
Battery Remaining Longevity: 47 mo
Brady Statistic AP VP Percent: 1 %
Brady Statistic AP VS Percent: 1 %
Brady Statistic AS VP Percent: 95 %
Brady Statistic AS VS Percent: 4.8 %
Date Time Interrogation Session: 20150212123818
Implantable Pulse Generator Model: 3210
Implantable Pulse Generator Serial Number: 2428292
Lead Channel Impedance Value: 430 Ohm
Lead Channel Pacing Threshold Amplitude: 0.75 V
Lead Channel Pacing Threshold Amplitude: 0.75 V
Lead Channel Pacing Threshold Pulse Width: 0.7 ms
Lead Channel Sensing Intrinsic Amplitude: 0.7 mV
Lead Channel Sensing Intrinsic Amplitude: 12 mV
Lead Channel Setting Pacing Amplitude: 2 V
Lead Channel Setting Pacing Amplitude: 2 V
Lead Channel Setting Pacing Amplitude: 2 V
Lead Channel Setting Pacing Pulse Width: 0.5 ms
Lead Channel Setting Pacing Pulse Width: 0.7 ms
Lead Channel Setting Sensing Sensitivity: 2.5 mV
MDC IDC MSMT BATTERY VOLTAGE: 2.9 V
MDC IDC MSMT LEADCHNL LV IMPEDANCE VALUE: 580 Ohm
MDC IDC MSMT LEADCHNL RA IMPEDANCE VALUE: 450 Ohm
MDC IDC MSMT LEADCHNL RV PACING THRESHOLD PULSEWIDTH: 0.5 ms
MDC IDC STAT BRADY RA PERCENT PACED: 1 %

## 2013-12-01 ENCOUNTER — Encounter: Payer: Self-pay | Admitting: *Deleted

## 2013-12-06 ENCOUNTER — Telehealth: Payer: Self-pay | Admitting: Internal Medicine

## 2013-12-06 NOTE — Telephone Encounter (Signed)
Transmission received, patient aware. 

## 2013-12-06 NOTE — Telephone Encounter (Signed)
New problem    Pt got a letter saying this office did not recive his last remote transmition.  Please give pt a call.

## 2013-12-06 NOTE — Telephone Encounter (Signed)
Updated pt we did receive his transmission on 11/25/13. I said he can disregard the letter he received on 12/01/13.

## 2013-12-08 ENCOUNTER — Other Ambulatory Visit (HOSPITAL_COMMUNITY): Payer: Self-pay | Admitting: Internal Medicine

## 2013-12-08 ENCOUNTER — Encounter: Payer: Self-pay | Admitting: *Deleted

## 2013-12-08 ENCOUNTER — Ambulatory Visit (INDEPENDENT_AMBULATORY_CARE_PROVIDER_SITE_OTHER): Payer: Medicare Other | Admitting: Pharmacist

## 2013-12-08 DIAGNOSIS — Z7901 Long term (current) use of anticoagulants: Secondary | ICD-10-CM | POA: Diagnosis not present

## 2013-12-08 DIAGNOSIS — I4891 Unspecified atrial fibrillation: Secondary | ICD-10-CM

## 2013-12-08 LAB — POCT INR: INR: 2.8

## 2014-01-18 ENCOUNTER — Ambulatory Visit (INDEPENDENT_AMBULATORY_CARE_PROVIDER_SITE_OTHER): Payer: Medicare Other | Admitting: Pharmacist

## 2014-01-18 DIAGNOSIS — Z7901 Long term (current) use of anticoagulants: Secondary | ICD-10-CM | POA: Diagnosis not present

## 2014-01-18 DIAGNOSIS — I4891 Unspecified atrial fibrillation: Secondary | ICD-10-CM | POA: Diagnosis not present

## 2014-01-18 LAB — POCT INR: INR: 3.6

## 2014-01-24 ENCOUNTER — Other Ambulatory Visit: Payer: Self-pay | Admitting: Internal Medicine

## 2014-02-08 ENCOUNTER — Ambulatory Visit (INDEPENDENT_AMBULATORY_CARE_PROVIDER_SITE_OTHER): Payer: Medicare Other

## 2014-02-08 DIAGNOSIS — Z7901 Long term (current) use of anticoagulants: Secondary | ICD-10-CM

## 2014-02-08 DIAGNOSIS — I4891 Unspecified atrial fibrillation: Secondary | ICD-10-CM | POA: Diagnosis not present

## 2014-02-08 LAB — POCT INR: INR: 3.4

## 2014-02-28 ENCOUNTER — Encounter: Payer: Self-pay | Admitting: Internal Medicine

## 2014-02-28 ENCOUNTER — Ambulatory Visit (INDEPENDENT_AMBULATORY_CARE_PROVIDER_SITE_OTHER): Payer: Medicare Other | Admitting: *Deleted

## 2014-02-28 DIAGNOSIS — I4891 Unspecified atrial fibrillation: Secondary | ICD-10-CM | POA: Diagnosis not present

## 2014-02-28 LAB — MDC_IDC_ENUM_SESS_TYPE_REMOTE
Battery Remaining Longevity: 44 mo
Brady Statistic AP VP Percent: 1 %
Brady Statistic AS VS Percent: 4.7 %
Date Time Interrogation Session: 20150518121834
Implantable Pulse Generator Model: 3210
Implantable Pulse Generator Serial Number: 2428292
Lead Channel Impedance Value: 430 Ohm
Lead Channel Impedance Value: 610 Ohm
Lead Channel Pacing Threshold Amplitude: 0.75 V
Lead Channel Pacing Threshold Amplitude: 0.75 V
Lead Channel Pacing Threshold Pulse Width: 0.5 ms
Lead Channel Pacing Threshold Pulse Width: 0.7 ms
Lead Channel Sensing Intrinsic Amplitude: 0.7 mV
Lead Channel Sensing Intrinsic Amplitude: 12 mV
Lead Channel Setting Pacing Amplitude: 2 V
Lead Channel Setting Pacing Amplitude: 2 V
Lead Channel Setting Pacing Pulse Width: 0.5 ms
Lead Channel Setting Pacing Pulse Width: 0.7 ms
Lead Channel Setting Sensing Sensitivity: 2.5 mV
MDC IDC MSMT BATTERY REMAINING PERCENTAGE: 48 %
MDC IDC MSMT BATTERY VOLTAGE: 2.89 V
MDC IDC MSMT LEADCHNL RA IMPEDANCE VALUE: 460 Ohm
MDC IDC SET LEADCHNL LV PACING AMPLITUDE: 2 V
MDC IDC STAT BRADY AP VS PERCENT: 1 %
MDC IDC STAT BRADY AS VP PERCENT: 95 %
MDC IDC STAT BRADY RA PERCENT PACED: 1 %

## 2014-02-28 NOTE — Progress Notes (Signed)
Remote pacemaker transmission.   

## 2014-03-01 ENCOUNTER — Ambulatory Visit (INDEPENDENT_AMBULATORY_CARE_PROVIDER_SITE_OTHER): Payer: Medicare Other

## 2014-03-01 DIAGNOSIS — Z7901 Long term (current) use of anticoagulants: Secondary | ICD-10-CM

## 2014-03-01 DIAGNOSIS — I4891 Unspecified atrial fibrillation: Secondary | ICD-10-CM

## 2014-03-01 LAB — POCT INR: INR: 2.2

## 2014-03-11 ENCOUNTER — Encounter: Payer: Self-pay | Admitting: Cardiology

## 2014-03-22 ENCOUNTER — Other Ambulatory Visit: Payer: Self-pay | Admitting: Internal Medicine

## 2014-03-29 ENCOUNTER — Ambulatory Visit (INDEPENDENT_AMBULATORY_CARE_PROVIDER_SITE_OTHER): Payer: Medicare Other | Admitting: Pharmacist

## 2014-03-29 DIAGNOSIS — I4891 Unspecified atrial fibrillation: Secondary | ICD-10-CM

## 2014-03-29 DIAGNOSIS — Z7901 Long term (current) use of anticoagulants: Secondary | ICD-10-CM

## 2014-03-29 LAB — POCT INR: INR: 2.6

## 2014-04-02 ENCOUNTER — Other Ambulatory Visit: Payer: Self-pay | Admitting: Internal Medicine

## 2014-04-06 ENCOUNTER — Other Ambulatory Visit (HOSPITAL_COMMUNITY): Payer: Self-pay

## 2014-04-06 MED ORDER — POTASSIUM CHLORIDE CRYS ER 20 MEQ PO TBCR: EXTENDED_RELEASE_TABLET | ORAL | Status: DC

## 2014-04-07 ENCOUNTER — Other Ambulatory Visit (HOSPITAL_COMMUNITY): Payer: Self-pay | Admitting: *Deleted

## 2014-04-07 MED ORDER — HYDRALAZINE HCL 50 MG PO TABS: ORAL_TABLET | ORAL | Status: DC

## 2014-04-26 ENCOUNTER — Ambulatory Visit (INDEPENDENT_AMBULATORY_CARE_PROVIDER_SITE_OTHER): Payer: Medicare Other | Admitting: *Deleted

## 2014-04-26 DIAGNOSIS — Z7901 Long term (current) use of anticoagulants: Secondary | ICD-10-CM

## 2014-04-26 DIAGNOSIS — I4891 Unspecified atrial fibrillation: Secondary | ICD-10-CM

## 2014-04-26 LAB — POCT INR: INR: 2.7

## 2014-06-01 ENCOUNTER — Other Ambulatory Visit (HOSPITAL_COMMUNITY): Payer: Self-pay | Admitting: Internal Medicine

## 2014-06-01 ENCOUNTER — Encounter: Payer: Self-pay | Admitting: Internal Medicine

## 2014-06-01 ENCOUNTER — Ambulatory Visit (INDEPENDENT_AMBULATORY_CARE_PROVIDER_SITE_OTHER): Payer: Medicare Other | Admitting: *Deleted

## 2014-06-01 DIAGNOSIS — Z950 Presence of cardiac pacemaker: Secondary | ICD-10-CM | POA: Diagnosis not present

## 2014-06-01 DIAGNOSIS — I428 Other cardiomyopathies: Secondary | ICD-10-CM | POA: Diagnosis not present

## 2014-06-01 LAB — MDC_IDC_ENUM_SESS_TYPE_REMOTE
Battery Remaining Longevity: 39 mo
Battery Remaining Percentage: 43 %
Battery Voltage: 2.87 V
Brady Statistic AS VP Percent: 95 %
Implantable Pulse Generator Model: 3210
Lead Channel Impedance Value: 440 Ohm
Lead Channel Impedance Value: 680 Ohm
Lead Channel Pacing Threshold Pulse Width: 0.5 ms
Lead Channel Setting Pacing Amplitude: 2 V
Lead Channel Setting Pacing Pulse Width: 0.5 ms
Lead Channel Setting Sensing Sensitivity: 2.5 mV
MDC IDC MSMT LEADCHNL RV IMPEDANCE VALUE: 410 Ohm
MDC IDC MSMT LEADCHNL RV PACING THRESHOLD AMPLITUDE: 0.75 V
MDC IDC MSMT LEADCHNL RV SENSING INTR AMPL: 12 mV
MDC IDC PG SERIAL: 2428292
MDC IDC SESS DTM: 20150819122138
MDC IDC SET LEADCHNL LV PACING AMPLITUDE: 2 V
MDC IDC SET LEADCHNL LV PACING PULSEWIDTH: 0.7 ms
MDC IDC SET LEADCHNL RV PACING AMPLITUDE: 2 V
MDC IDC STAT BRADY AP VP PERCENT: 1 %
MDC IDC STAT BRADY AP VS PERCENT: 1 %
MDC IDC STAT BRADY AS VS PERCENT: 4.8 %
MDC IDC STAT BRADY RA PERCENT PACED: 1 %

## 2014-06-01 NOTE — Progress Notes (Signed)
Remote pacemaker transmission.   

## 2014-06-02 ENCOUNTER — Other Ambulatory Visit (HOSPITAL_COMMUNITY): Payer: Self-pay | Admitting: Internal Medicine

## 2014-06-07 ENCOUNTER — Ambulatory Visit (INDEPENDENT_AMBULATORY_CARE_PROVIDER_SITE_OTHER): Payer: Medicare Other | Admitting: *Deleted

## 2014-06-07 DIAGNOSIS — I4891 Unspecified atrial fibrillation: Secondary | ICD-10-CM | POA: Diagnosis not present

## 2014-06-07 DIAGNOSIS — Z7901 Long term (current) use of anticoagulants: Secondary | ICD-10-CM | POA: Diagnosis not present

## 2014-06-07 LAB — POCT INR: INR: 2.9

## 2014-06-21 ENCOUNTER — Other Ambulatory Visit (HOSPITAL_COMMUNITY): Payer: Self-pay | Admitting: Internal Medicine

## 2014-06-21 DIAGNOSIS — I25119 Atherosclerotic heart disease of native coronary artery with unspecified angina pectoris: Secondary | ICD-10-CM

## 2014-06-23 ENCOUNTER — Encounter: Payer: Self-pay | Admitting: Cardiology

## 2014-07-02 ENCOUNTER — Other Ambulatory Visit: Payer: Self-pay | Admitting: Internal Medicine

## 2014-07-09 ENCOUNTER — Other Ambulatory Visit: Payer: Self-pay | Admitting: Internal Medicine

## 2014-07-10 ENCOUNTER — Other Ambulatory Visit: Payer: Self-pay | Admitting: Internal Medicine

## 2014-07-16 ENCOUNTER — Other Ambulatory Visit: Payer: Self-pay | Admitting: Internal Medicine

## 2014-07-19 ENCOUNTER — Ambulatory Visit (INDEPENDENT_AMBULATORY_CARE_PROVIDER_SITE_OTHER): Payer: Medicare Other | Admitting: Pharmacist

## 2014-07-19 DIAGNOSIS — I4891 Unspecified atrial fibrillation: Secondary | ICD-10-CM | POA: Diagnosis not present

## 2014-07-19 DIAGNOSIS — Z7901 Long term (current) use of anticoagulants: Secondary | ICD-10-CM | POA: Diagnosis not present

## 2014-07-19 LAB — POCT INR: INR: 3.3

## 2014-07-20 ENCOUNTER — Telehealth: Payer: Self-pay | Admitting: Internal Medicine

## 2014-07-20 ENCOUNTER — Telehealth: Payer: Self-pay | Admitting: *Deleted

## 2014-07-20 DIAGNOSIS — I509 Heart failure, unspecified: Secondary | ICD-10-CM

## 2014-07-20 NOTE — Telephone Encounter (Signed)
error 

## 2014-07-20 NOTE — Telephone Encounter (Signed)
Patient would like for you to call him on his home phone. He was also wanting to know if he should still be taking the potassium. He is aware that he needs an appointment, but stated that he would discuss that with you and he will schedule at that time if necessary. Please advise. Thanks, MI

## 2014-07-22 MED ORDER — POTASSIUM CHLORIDE ER 10 MEQ PO TBCR: 20.0000 meq | EXTENDED_RELEASE_TABLET | Freq: Two times a day (BID) | ORAL | Status: DC

## 2014-07-22 NOTE — Telephone Encounter (Signed)
Pt advised to continue potassium with current dose Given follow up appointment

## 2014-07-28 ENCOUNTER — Encounter (HOSPITAL_COMMUNITY): Payer: Self-pay

## 2014-07-28 ENCOUNTER — Ambulatory Visit (HOSPITAL_COMMUNITY)
Admission: RE | Admit: 2014-07-28 | Discharge: 2014-07-28 | Disposition: A | Discharge status: Home / Self Care | Payer: Medicare Other | Source: Ambulatory Visit | Attending: Internal Medicine | Admitting: Internal Medicine

## 2014-07-28 VITALS — BP 127/72 | HR 69 | Resp 18 | Wt 269.0 lb

## 2014-07-28 DIAGNOSIS — Z794 Long term (current) use of insulin: Secondary | ICD-10-CM | POA: Insufficient documentation

## 2014-07-28 DIAGNOSIS — I482 Chronic atrial fibrillation: Secondary | ICD-10-CM | POA: Diagnosis not present

## 2014-07-28 DIAGNOSIS — E785 Hyperlipidemia, unspecified: Secondary | ICD-10-CM | POA: Diagnosis not present

## 2014-07-28 DIAGNOSIS — Z7982 Long term (current) use of aspirin: Secondary | ICD-10-CM | POA: Diagnosis not present

## 2014-07-28 DIAGNOSIS — Z7901 Long term (current) use of anticoagulants: Secondary | ICD-10-CM | POA: Insufficient documentation

## 2014-07-28 DIAGNOSIS — I5022 Chronic systolic (congestive) heart failure: Secondary | ICD-10-CM | POA: Insufficient documentation

## 2014-07-28 DIAGNOSIS — I10 Essential (primary) hypertension: Secondary | ICD-10-CM

## 2014-07-28 DIAGNOSIS — E119 Type 2 diabetes mellitus without complications: Secondary | ICD-10-CM | POA: Diagnosis not present

## 2014-07-28 DIAGNOSIS — I480 Paroxysmal atrial fibrillation: Secondary | ICD-10-CM

## 2014-07-28 DIAGNOSIS — I2510 Atherosclerotic heart disease of native coronary artery without angina pectoris: Secondary | ICD-10-CM | POA: Insufficient documentation

## 2014-07-28 DIAGNOSIS — I429 Cardiomyopathy, unspecified: Secondary | ICD-10-CM | POA: Diagnosis not present

## 2014-07-28 NOTE — Progress Notes (Signed)
Patient ID: AC COLAN, male   DOB: May 04, 1941, 73 y.o.   MRN: [id number]  HPI:  Mr. Eslick is a 73 yo male with a h/o moderate nonobstructive CAD, nonischemic cardiomyopathy with CHF, HTN, atrial arrythmias (now chronic AF) and obesity.  He initially had an EF of 20-25% and single chamber ICD was implanted. EF then recovered to 50% (echo 2008) and had ICD explanted and BiV pacer placed due to bradycardia.  Had routine follow-up echo in 08/09/10 and showed EF back down to 25-30%. Medical therapy intensified but f/u echo in 12/06/10 showed worsening EF of 20-25%. So patient underwent R&L cath on 12/21/10  Cath results: 1) Mod nonobs CAD (mLAD 70%, m-dLAD 40%; oDx 60%; mCFX 30-40%; pRCA 30%)  2) RA 21  RV 81/16/24  PA 81/36 (56) PCWP 33 (v to 43) PA sat 43% 48%, Fick 4.2/1.6  Follow up: Overall he feels great. Denies SOB, orthopnea, CP or edema. Talking all medications as prescribed. Weight at home 250-260 pounds.  Walking with walker. [facility] in [place], [place],  and [place] with walker due to low back pain. Has INR followed at [facility].   FH: No premature CAD or family history of dilated CMP.   ROS: All systems negative except as listed in HPI, PMH and Problem List.  Past Medical History  Diagnosis Date  . Obesities, morbid   . Hypertension   . Hyperlipidemia   . Diabetes mellitus   . Polycythemia     vera requiring phlebotomy and treatment with hydroxyurea on a chronic bais. The patient followed by the [facility] for this.  . CHF (congestive heart failure)     secondary to nonischemic cardiomyopathy. EF initially 20-25% up to 50% in 2008. Echo 2/12 EF 20-25% . RHC 3/12: RA 21  RV 81/16/24  PA 81/36 (56) PCWP 33 (v to 43) PA sat 43% 48%.status post Medtronic biventricular ICD placement.(with Sprint Fidelis leads)-explanted--now with St.Jude Anthem BivPM   . 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 Atrial fibrillation     Chronic. On coumadin.  .  Coronary artery disease, non-occlusive     Cath 3/12: mLAD 70%, m-dLAD 40%; Dx 60%; mCFX 30-40%; pRCA 30%)    Current Outpatient Prescriptions  Medication Sig Dispense Refill  . acarbose (PRECOSE) 100 MG tablet Take 50 mg by mouth 3 (three) times daily with meals.      Marland Kitchen allopurinol (ZYLOPRIM) 300 MG tablet Take 300 mg by mouth daily.        Marland Kitchen aspirin 81 MG chewable tablet Chew 81 mg by mouth daily.      . benazepril (LOTENSIN) 20 MG tablet Take 10 mg by mouth daily.       . carvedilol (COREG) 25 MG tablet TAKE 2 TABLETS BY MOUTH TWICE A DAY  360 tablet  2  . digoxin (LANOXIN) 0.125 MG tablet TAKE 1 TABLET BY MOUTH EVERY DAY  30 tablet  6  . furosemide (LASIX) 20 MG tablet Take 40 mg by mouth 2 (two) times daily.      . hydrALAZINE (APRESOLINE) 50 MG tablet TAKE 1 TABLET BY MOUTH 3 TIMES A DAY  90 tablet  1  . HYDROPHILIC EX Apply 1 application topically as needed (For dry skin/heels).      . hydroxyurea (HYDREA) 500 MG capsule Take 500 mg by mouth 3 (three) times daily. May take with food to minimize GI side effects.       Marland Kitchen  insulin NPH (HUMULIN N,NOVOLIN N) 100 UNIT/ML injection Inject 18-22 Units into the skin as directed. Take 14 units 30 minutes before last meal and 20 units 1 hour before first meal      . KLOR-CON M20 20 MEQ tablet TAKE 2 TABLETS BY MOUTH EVERY DAY (NEEDS FOLLOW-UP APPOINTMENT)  60 tablet  1  . sildenafil (VIAGRA) 100 MG tablet Take 100 mg by mouth daily as needed for erectile dysfunction (*DO NOT EXCEED 1 DOSE PER 24 HOUR PERIOD*).      Marland Kitchen simvastatin (ZOCOR) 20 MG tablet TAKE 1 TABLET BY MOUTH AT BEDTIME  30 tablet  11  . terazosin (HYTRIN) 2 MG capsule Take 2 mg by mouth at bedtime. For help with urination      . warfarin (COUMADIN) 6 MG tablet Take as directed by the coumadin clinic       No current facility-administered medications for this encounter.    Filed Vitals:   07/28/14 1447  BP: 127/72  Pulse: 69  Resp: 18  Weight: 269 lb (122.018 kg)  SpO2: 90%    PHYSICAL EXAM: General:  Well appearing. No resp difficulty; walks with walker. Girlfriend present  HEENT: normal Neck: supple. JVP flat. Carotids 2+ bilaterally; no bruits. No lymphadenopathy or thryomegaly appreciated. Cor: PMI laterally displaced. Irregular rate & rhythm. No rubs, gallops or murmurs. Lungs: clear Abdomen: obese soft, nontender, nondistended. No hepatosplenomegaly. No bruits or masses. Good bowel sounds. Extremities: no cyanosis, clubbing, rash, edema Neuro: alert & orientedx3, cranial nerves grossly intact. Moves all 4 extremities. Affect pleasant.  ASSESSMENT & PLAN:  1) Chronic systolic HF: Nonischemic cardiomyopathy (nonobstructive CAD unlikely to be primary cause of CMP). ECHO EF 20-25% (2011)---> 07/2014 EF 30-35% Has St Jude BiV  [facility] checking [facility].  - Continue coreg 25 mg BID goal dose, benazepril 10 mg BID and digoxin 0.125 mcg. He is on hydralazine 50 mg TID, however no IMDUR as he uses Viagra. - Reinforced the need and importance of daily weights, a low sodium diet, and fluid restriction (less than 2 L a day). Instructed to call the HF clinic if weight increases more than 3 lbs overnight or 5 lbs in a week.  - Check dig level at next inr check.  -2) Chronic A-fib: Rate-controlled. Continue BB and Coumadin.  3) CAD: Moderate nonobstructive CAD on prior cath.  No ASA as he is on warfarin. Continue simvastatin. Lipids followed at the [place]. .   F/U in 6 months with Dr Precious Bard NP-C 3:02 PM

## 2014-07-28 NOTE — Patient Instructions (Signed)
Follow up in 6 months 

## 2014-08-04 ENCOUNTER — Other Ambulatory Visit (HOSPITAL_COMMUNITY): Payer: Self-pay | Admitting: Internal Medicine

## 2014-08-30 ENCOUNTER — Ambulatory Visit (INDEPENDENT_AMBULATORY_CARE_PROVIDER_SITE_OTHER): Payer: Medicare Other | Admitting: Pharmacist

## 2014-08-30 ENCOUNTER — Ambulatory Visit (INDEPENDENT_AMBULATORY_CARE_PROVIDER_SITE_OTHER): Payer: Medicare Other | Admitting: Internal Medicine

## 2014-08-30 ENCOUNTER — Other Ambulatory Visit: Payer: Self-pay | Admitting: Internal Medicine

## 2014-08-30 ENCOUNTER — Encounter: Payer: Self-pay | Admitting: Internal Medicine

## 2014-08-30 VITALS — BP 140/72 | HR 78 | Ht 75.0 in | Wt 276.0 lb

## 2014-08-30 DIAGNOSIS — I25119 Atherosclerotic heart disease of native coronary artery with unspecified angina pectoris: Secondary | ICD-10-CM | POA: Diagnosis not present

## 2014-08-30 DIAGNOSIS — I4891 Unspecified atrial fibrillation: Secondary | ICD-10-CM | POA: Diagnosis not present

## 2014-08-30 DIAGNOSIS — I480 Paroxysmal atrial fibrillation: Secondary | ICD-10-CM | POA: Diagnosis not present

## 2014-08-30 DIAGNOSIS — Z7901 Long term (current) use of anticoagulants: Secondary | ICD-10-CM | POA: Diagnosis not present

## 2014-08-30 DIAGNOSIS — I5022 Chronic systolic (congestive) heart failure: Secondary | ICD-10-CM | POA: Diagnosis not present

## 2014-08-30 LAB — MDC_IDC_ENUM_SESS_TYPE_INCLINIC
Battery Remaining Longevity: 52.8 mo
Battery Voltage: 2.89 V
Battery Voltage: 2.99 V
Brady Statistic RA Percent Paced: 88 %
Brady Statistic RV Percent Paced: 99.81 %
Date Time Interrogation Session: 20151117120529
Implantable Pulse Generator Serial Number: 2428292
Lead Channel Impedance Value: 400 Ohm
Lead Channel Impedance Value: 425 Ohm
Lead Channel Impedance Value: 512.5 Ohm
Lead Channel Pacing Threshold Amplitude: 1 V
Lead Channel Pacing Threshold Amplitude: 0.75 V
Lead Channel Pacing Threshold Amplitude: 0.75 V
Lead Channel Pacing Threshold Pulse Width: 0.7 ms
Lead Channel Pacing Threshold Pulse Width: 0.5 ms
Lead Channel Pacing Threshold Pulse Width: 0.5 ms
Lead Channel Sensing Intrinsic Amplitude: 0.4 mV
Lead Channel Sensing Intrinsic Amplitude: 8.1 mV
Lead Channel Setting Pacing Amplitude: 2 V
Lead Channel Setting Pacing Amplitude: 2 V
Lead Channel Setting Pacing Amplitude: 2.5 V
Lead Channel Setting Pacing Pulse Width: 0.5 ms
Lead Channel Setting Sensing Sensitivity: 6 mV
MDC IDC MSMT BATTERY REMAINING LONGEVITY: 94.8 mo
MDC IDC MSMT LEADCHNL LV IMPEDANCE VALUE: 587.5 Ohm
MDC IDC MSMT LEADCHNL RA IMPEDANCE VALUE: 412.5 Ohm
MDC IDC MSMT LEADCHNL RA SENSING INTR AMPL: 2.4 mV
MDC IDC MSMT LEADCHNL RV PACING THRESHOLD AMPLITUDE: 0.75 V
MDC IDC MSMT LEADCHNL RV PACING THRESHOLD AMPLITUDE: 0.75 V
MDC IDC MSMT LEADCHNL RV PACING THRESHOLD AMPLITUDE: 0.75 V
MDC IDC MSMT LEADCHNL RV PACING THRESHOLD PULSEWIDTH: 0.5 ms
MDC IDC MSMT LEADCHNL RV PACING THRESHOLD PULSEWIDTH: 0.5 ms
MDC IDC MSMT LEADCHNL RV PACING THRESHOLD PULSEWIDTH: 0.5 ms
MDC IDC MSMT LEADCHNL RV SENSING INTR AMPL: 12 mV
MDC IDC PG SERIAL: 2428292
MDC IDC SESS DTM: 20151117123258
MDC IDC SET LEADCHNL LV PACING PULSEWIDTH: 0.7 ms
MDC IDC SET LEADCHNL RA PACING AMPLITUDE: 2 V
MDC IDC SET LEADCHNL RA PACING AMPLITUDE: 2 V
MDC IDC SET LEADCHNL RV PACING PULSEWIDTH: 0.5 ms
MDC IDC SET LEADCHNL RV SENSING SENSITIVITY: 2.5 mV
MDC IDC STAT BRADY RA PERCENT PACED: 0.45 %
MDC IDC STAT BRADY RV PERCENT PACED: 95 %

## 2014-08-30 LAB — POCT INR: INR: 2.9

## 2014-08-30 NOTE — Patient Instructions (Signed)
Your physician wants you to follow-up in: 12 months with Dr. Knox Saliva will receive a reminder letter in the mail two months in advance. If you don't receive a letter, please call our office to schedule the follow-up appointment.   Remote monitoring is used to monitor your Pacemaker or ICD from home. This monitoring reduces the number of office visits required to check your device to one time per year. It allows [place] to keep an eye on the functioning of your device to ensure it is working properly. You are scheduled for a device check from home on 11/30/14. You may send your transmission at any time that day. If you have a wireless device, the transmission will be sent automatically. After your physician reviews your transmission, you will receive a postcard with your next transmission date.

## 2014-08-30 NOTE — Progress Notes (Signed)
HPI [name] returns today for followup. He is a very pleasant 73 year old man with chronic systolic heart failure, atrial fibrillation, a nonischemic cardiomyopathy, status post biventricular pacemaker insertion. The patient denies medical noncompliance. In the interim he denies chest pain or sob. He has mild peripheral edema and admits to some sodium indiscretion. No syncope.  No Known Allergies   Current Outpatient Prescriptions  Medication Sig Dispense Refill  . acarbose (PRECOSE) 100 MG tablet Take 50 mg by mouth 3 (three) times daily with meals.    Marland Kitchen allopurinol (ZYLOPRIM) 300 MG tablet Take 300 mg by mouth daily.      Marland Kitchen aspirin 81 MG chewable tablet Chew 81 mg by mouth daily.    . benazepril (LOTENSIN) 20 MG tablet Take 10 mg by mouth daily.     . carvedilol (COREG) 25 MG tablet TAKE 2 TABLETS BY MOUTH TWICE A DAY 360 tablet 2  . digoxin (LANOXIN) 0.125 MG tablet TAKE 1 TABLET BY MOUTH EVERY DAY 30 tablet 6  . furosemide (LASIX) 20 MG tablet Take 40 mg by mouth 2 (two) times daily.    . hydrALAZINE (APRESOLINE) 50 MG tablet TAKE 1 TABLET BY MOUTH 3 TIMES A DAY 90 tablet 6  . HYDROPHILIC EX Apply 1 application topically as needed (For dry skin/heels).    . hydroxyurea (HYDREA) 500 MG capsule Take 500 mg by mouth 3 (three) times daily. May take with food to minimize GI side effects.     . insulin NPH (HUMULIN N,NOVOLIN N) 100 UNIT/ML injection Inject 18-22 Units into the skin as directed. Take 14 units 30 minutes before last meal and 20 units 1 hour before first meal    . KLOR-CON M20 20 MEQ tablet TAKE 2 TABLETS BY MOUTH EVERY DAY (NEEDS FOLLOW-UP APPOINTMENT) 60 tablet 1  . sildenafil (VIAGRA) 100 MG tablet Take 100 mg by mouth daily as needed for erectile dysfunction (*DO NOT EXCEED 1 DOSE PER 24 HOUR PERIOD*).    Marland Kitchen simvastatin (ZOCOR) 20 MG tablet TAKE 1 TABLET BY MOUTH AT BEDTIME 30 tablet 11  . terazosin (HYTRIN) 2 MG capsule Take 2 mg by mouth at bedtime. For help with urination     . warfarin (COUMADIN) 6 MG tablet Take as directed by the coumadin clinic     No current facility-administered medications for this visit.     Past Medical History  Diagnosis Date  . Obesities, morbid   . Hypertension   . Hyperlipidemia   . Diabetes mellitus   . Polycythemia     vera requiring phlebotomy and treatment with hydroxyurea on a chronic bais. The patient followed by the [facility] for this.  . CHF (congestive heart failure)     secondary to nonischemic cardiomyopathy. EF initially 20-25% up to 50% in 2008. Echo 2/12 EF 20-25% . RHC 3/12: RA 21  RV 81/16/24  PA 81/36 (56) PCWP 33 (v to 43) PA sat 43% 48%.status post Medtronic biventricular ICD placement.(with Sprint Fidelis leads)-explanted--now with St.Jude Anthem BivPM   . 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 Atrial fibrillation     Chronic. On coumadin.  . Coronary artery disease, non-occlusive     Cath 3/12: mLAD 70%, m-dLAD 40%; Dx 60%; mCFX 30-40%; pRCA 30%)    ROS:   All systems reviewed and negative except as noted in the HPI.   Past Surgical History  Procedure Laterality Date  . Cardiac catheterization    . Insert / replace / remove  pacemaker      ICD - St. Jude     History reviewed. No pertinent family history.   History   Social History  . Marital Status: Married    Spouse Name: N/A    Number of Children: N/A  . Years of Education: N/A   Occupational History  . Not on file.   Social History Main Topics  . Smoking status: Former Research scientist (life sciences)  . Smokeless tobacco: Not on file  . Alcohol Use: No  . Drug Use: No  . Sexual Activity: Not on file   Other Topics Concern  . Not on file   Social History Narrative   Retired   Married    Tobacco Use - No   Alcohol Use - No     BP 140/72 mmHg  Pulse 78  Ht 6\' 3"  (1.905 m)  Wt 276 lb (125.193 kg)  BMI 34.50 kg/m2  Physical Exam:  Well appearing 73 year old man, NAD HEENT: Unremarkable Neck:  6 cm JVD, no  thyromegally Lungs:  Clear with no wheezes, rhonchi, or increased work of breathing. Bilateral rales are present approximately 1/3 the way up. HEART:  Regular rate rhythm, no murmurs, no rubs, no clicks Abd:  soft, positive bowel sounds, no organomegally, no rebound, no guarding Ext:  2 plus pulses, no cyanosis, no clubbing, 1+ peripheral edema bilaterally Skin:  No rashes no nodules Neuro:  CN II through XII intact, motor grossly intact   DEVICE  Normal device function.  See PaceArt for details.   Assess/Plan:

## 2014-08-31 ENCOUNTER — Telehealth: Payer: Self-pay | Admitting: *Deleted

## 2014-08-31 LAB — DIGOXIN LEVEL: Digoxin Level: 0.7 ng/mL — ABNORMAL LOW (ref 0.8–2.0)

## 2014-08-31 NOTE — Telephone Encounter (Signed)
Pt calls to tell me that the medication that the [facility] discontinued was glipizide

## 2014-09-09 ENCOUNTER — Other Ambulatory Visit (HOSPITAL_COMMUNITY): Payer: Self-pay | Admitting: Internal Medicine

## 2014-09-13 ENCOUNTER — Other Ambulatory Visit (HOSPITAL_COMMUNITY): Payer: Self-pay | Admitting: Internal Medicine

## 2014-09-13 DIAGNOSIS — I5022 Chronic systolic (congestive) heart failure: Secondary | ICD-10-CM

## 2014-09-15 ENCOUNTER — Other Ambulatory Visit: Payer: Self-pay | Admitting: Internal Medicine

## 2014-09-16 ENCOUNTER — Other Ambulatory Visit: Payer: Self-pay | Admitting: *Deleted

## 2014-09-16 MED ORDER — FUROSEMIDE 40 MG PO TABS: 40.0000 mg | ORAL_TABLET | Freq: Two times a day (BID) | ORAL | Status: DC

## 2014-09-22 ENCOUNTER — Other Ambulatory Visit (HOSPITAL_COMMUNITY): Payer: Self-pay | Admitting: Internal Medicine

## 2014-10-11 ENCOUNTER — Ambulatory Visit (INDEPENDENT_AMBULATORY_CARE_PROVIDER_SITE_OTHER): Payer: Medicare Other | Admitting: *Deleted

## 2014-10-11 DIAGNOSIS — Z7901 Long term (current) use of anticoagulants: Secondary | ICD-10-CM | POA: Diagnosis not present

## 2014-10-11 DIAGNOSIS — I4891 Unspecified atrial fibrillation: Secondary | ICD-10-CM

## 2014-10-11 LAB — POCT INR: INR: 2.3

## 2014-10-14 ENCOUNTER — Other Ambulatory Visit (HOSPITAL_COMMUNITY): Payer: Self-pay | Admitting: Internal Medicine

## 2014-11-22 ENCOUNTER — Ambulatory Visit (INDEPENDENT_AMBULATORY_CARE_PROVIDER_SITE_OTHER): Payer: Medicare Other | Admitting: *Deleted

## 2014-11-22 DIAGNOSIS — I4891 Unspecified atrial fibrillation: Secondary | ICD-10-CM

## 2014-11-22 DIAGNOSIS — Z7901 Long term (current) use of anticoagulants: Secondary | ICD-10-CM

## 2014-11-22 LAB — POCT INR: INR: 2.5

## 2014-11-30 ENCOUNTER — Ambulatory Visit (INDEPENDENT_AMBULATORY_CARE_PROVIDER_SITE_OTHER): Payer: Medicare Other | Admitting: *Deleted

## 2014-11-30 ENCOUNTER — Encounter: Payer: Self-pay | Admitting: Internal Medicine

## 2014-11-30 ENCOUNTER — Telehealth: Payer: Self-pay | Admitting: Cardiology

## 2014-11-30 DIAGNOSIS — I480 Paroxysmal atrial fibrillation: Secondary | ICD-10-CM | POA: Diagnosis not present

## 2014-11-30 LAB — MDC_IDC_ENUM_SESS_TYPE_REMOTE
Battery Remaining Longevity: 28 mo
Battery Remaining Percentage: 33 %
Battery Voltage: 2.84 V
Brady Statistic AP VP Percent: 1 %
Brady Statistic AP VS Percent: 1 %
Brady Statistic AS VS Percent: 5.1 %
Brady Statistic RA Percent Paced: 1 %
Date Time Interrogation Session: 20160217083603
Implantable Pulse Generator Model: 3210
Implantable Pulse Generator Serial Number: 2428292
Lead Channel Impedance Value: 440 Ohm
Lead Channel Impedance Value: 610 Ohm
Lead Channel Pacing Threshold Amplitude: 0.875 V
Lead Channel Pacing Threshold Amplitude: 1 V
Lead Channel Sensing Intrinsic Amplitude: 12 mV
Lead Channel Setting Pacing Amplitude: 2 V
Lead Channel Setting Pacing Pulse Width: 0.5 ms
Lead Channel Setting Pacing Pulse Width: 0.7 ms
MDC IDC MSMT LEADCHNL LV PACING THRESHOLD PULSEWIDTH: 0.7 ms
MDC IDC MSMT LEADCHNL RA SENSING INTR AMPL: 0.4 mV
MDC IDC MSMT LEADCHNL RV IMPEDANCE VALUE: 410 Ohm
MDC IDC MSMT LEADCHNL RV PACING THRESHOLD PULSEWIDTH: 0.5 ms
MDC IDC SET LEADCHNL LV PACING AMPLITUDE: 2 V
MDC IDC SET LEADCHNL RA PACING AMPLITUDE: 2 V
MDC IDC SET LEADCHNL RV SENSING SENSITIVITY: 2.5 mV
MDC IDC STAT BRADY AS VP PERCENT: 95 %

## 2014-11-30 NOTE — Progress Notes (Signed)
Remote pacemaker transmission.   

## 2014-11-30 NOTE — Telephone Encounter (Signed)
Spoke with pt and reminded pt of remote transmission that is due today. Pt verbalized understanding.   

## 2014-12-09 ENCOUNTER — Encounter: Payer: Self-pay | Admitting: Cardiology

## 2015-01-03 ENCOUNTER — Ambulatory Visit (INDEPENDENT_AMBULATORY_CARE_PROVIDER_SITE_OTHER): Payer: Medicare Other | Admitting: *Deleted

## 2015-01-03 ENCOUNTER — Other Ambulatory Visit: Payer: Self-pay | Admitting: *Deleted

## 2015-01-03 DIAGNOSIS — I4891 Unspecified atrial fibrillation: Secondary | ICD-10-CM

## 2015-01-03 DIAGNOSIS — Z7901 Long term (current) use of anticoagulants: Secondary | ICD-10-CM | POA: Diagnosis not present

## 2015-01-03 LAB — POCT INR: INR: 1.9

## 2015-01-03 MED ORDER — WARFARIN SODIUM 6 MG PO TABS: ORAL_TABLET | ORAL | Status: DC

## 2015-01-03 NOTE — Telephone Encounter (Signed)
Refill done as requested 

## 2015-01-12 ENCOUNTER — Other Ambulatory Visit (HOSPITAL_COMMUNITY): Payer: Self-pay | Admitting: *Deleted

## 2015-01-12 DIAGNOSIS — I5022 Chronic systolic (congestive) heart failure: Secondary | ICD-10-CM

## 2015-01-12 MED ORDER — POTASSIUM CHLORIDE CRYS ER 20 MEQ PO TBCR
40.0000 meq | EXTENDED_RELEASE_TABLET | Freq: Every day | ORAL | Status: DC
Start: 2015-01-12 — End: 2015-06-26

## 2015-01-16 ENCOUNTER — Telehealth: Payer: Self-pay | Admitting: Internal Medicine

## 2015-01-16 NOTE — Telephone Encounter (Signed)
New Message       Pt calling stating that he ordered Androzene 336mg  that he is supposed to take 1-3 times per day as well as a dietary supplement Androchase 300mg  2 times per day and wants to know if it is ok for him to take these. Please call back and advise.

## 2015-01-19 NOTE — Telephone Encounter (Signed)
Spoke with patient and he is going to stick with using his Viagra

## 2015-02-07 ENCOUNTER — Ambulatory Visit (INDEPENDENT_AMBULATORY_CARE_PROVIDER_SITE_OTHER): Payer: Medicare Other | Admitting: *Deleted

## 2015-02-07 DIAGNOSIS — Z7901 Long term (current) use of anticoagulants: Secondary | ICD-10-CM | POA: Diagnosis not present

## 2015-02-07 DIAGNOSIS — I4891 Unspecified atrial fibrillation: Secondary | ICD-10-CM | POA: Diagnosis not present

## 2015-02-07 LAB — POCT INR: INR: 1.9

## 2015-02-21 ENCOUNTER — Ambulatory Visit (INDEPENDENT_AMBULATORY_CARE_PROVIDER_SITE_OTHER): Payer: Medicare Other | Admitting: Surgery

## 2015-02-21 DIAGNOSIS — I4891 Unspecified atrial fibrillation: Secondary | ICD-10-CM

## 2015-02-21 DIAGNOSIS — Z7901 Long term (current) use of anticoagulants: Secondary | ICD-10-CM

## 2015-02-21 LAB — POCT INR: INR: 2.3

## 2015-02-24 ENCOUNTER — Other Ambulatory Visit (HOSPITAL_COMMUNITY): Payer: Self-pay | Admitting: Internal Medicine

## 2015-03-01 ENCOUNTER — Other Ambulatory Visit (HOSPITAL_COMMUNITY): Payer: Self-pay | Admitting: Internal Medicine

## 2015-03-01 ENCOUNTER — Ambulatory Visit (INDEPENDENT_AMBULATORY_CARE_PROVIDER_SITE_OTHER): Payer: Medicare Other | Admitting: *Deleted

## 2015-03-01 ENCOUNTER — Encounter: Payer: Self-pay | Admitting: Internal Medicine

## 2015-03-01 DIAGNOSIS — I480 Paroxysmal atrial fibrillation: Secondary | ICD-10-CM

## 2015-03-01 NOTE — Progress Notes (Signed)
Remote pacemaker transmission.   

## 2015-03-08 LAB — CUP PACEART REMOTE DEVICE CHECK
Battery Remaining Longevity: 24 mo
Battery Remaining Percentage: 28 %
Brady Statistic AP VS Percent: 1 %
Brady Statistic AS VS Percent: 4.6 %
Brady Statistic RA Percent Paced: 1 %
Date Time Interrogation Session: 20160518122530
Lead Channel Impedance Value: 400 Ohm
Lead Channel Pacing Threshold Amplitude: 1 V
Lead Channel Pacing Threshold Pulse Width: 0.5 ms
Lead Channel Pacing Threshold Pulse Width: 0.7 ms
Lead Channel Sensing Intrinsic Amplitude: 0.4 mV
Lead Channel Sensing Intrinsic Amplitude: 12 mV
Lead Channel Setting Pacing Amplitude: 2 V
Lead Channel Setting Pacing Amplitude: 2 V
Lead Channel Setting Pacing Pulse Width: 0.5 ms
Lead Channel Setting Pacing Pulse Width: 0.7 ms
MDC IDC MSMT BATTERY VOLTAGE: 2.83 V
MDC IDC MSMT LEADCHNL LV IMPEDANCE VALUE: 630 Ohm
MDC IDC MSMT LEADCHNL RA IMPEDANCE VALUE: 440 Ohm
MDC IDC MSMT LEADCHNL RV PACING THRESHOLD AMPLITUDE: 0.625 V
MDC IDC SET LEADCHNL RA PACING AMPLITUDE: 2 V
MDC IDC SET LEADCHNL RV SENSING SENSITIVITY: 2.5 mV
MDC IDC STAT BRADY AP VP PERCENT: 1 %
MDC IDC STAT BRADY AS VP PERCENT: 95 %
Pulse Gen Serial Number: 2428292

## 2015-03-15 ENCOUNTER — Encounter: Payer: Self-pay | Admitting: Cardiology

## 2015-03-16 ENCOUNTER — Other Ambulatory Visit (HOSPITAL_COMMUNITY): Payer: Self-pay | Admitting: Internal Medicine

## 2015-03-21 ENCOUNTER — Ambulatory Visit (INDEPENDENT_AMBULATORY_CARE_PROVIDER_SITE_OTHER): Payer: Medicare Other | Admitting: *Deleted

## 2015-03-21 DIAGNOSIS — I4891 Unspecified atrial fibrillation: Secondary | ICD-10-CM

## 2015-03-21 DIAGNOSIS — Z7901 Long term (current) use of anticoagulants: Secondary | ICD-10-CM

## 2015-03-21 LAB — POCT INR: INR: 2.3

## 2015-04-18 ENCOUNTER — Ambulatory Visit (INDEPENDENT_AMBULATORY_CARE_PROVIDER_SITE_OTHER): Payer: Medicare Other | Admitting: *Deleted

## 2015-04-18 DIAGNOSIS — I4891 Unspecified atrial fibrillation: Secondary | ICD-10-CM | POA: Diagnosis not present

## 2015-04-18 DIAGNOSIS — Z7901 Long term (current) use of anticoagulants: Secondary | ICD-10-CM | POA: Diagnosis not present

## 2015-04-18 LAB — POCT INR: INR: 2.4

## 2015-05-30 ENCOUNTER — Ambulatory Visit (INDEPENDENT_AMBULATORY_CARE_PROVIDER_SITE_OTHER): Payer: Medicare Other | Admitting: *Deleted

## 2015-05-30 DIAGNOSIS — Z7901 Long term (current) use of anticoagulants: Secondary | ICD-10-CM | POA: Diagnosis not present

## 2015-05-30 DIAGNOSIS — I4891 Unspecified atrial fibrillation: Secondary | ICD-10-CM | POA: Diagnosis not present

## 2015-05-30 LAB — POCT INR: INR: 2.5

## 2015-06-05 ENCOUNTER — Encounter: Payer: Self-pay | Admitting: Internal Medicine

## 2015-06-05 ENCOUNTER — Ambulatory Visit (INDEPENDENT_AMBULATORY_CARE_PROVIDER_SITE_OTHER): Payer: Medicare Other | Admitting: *Deleted

## 2015-06-05 DIAGNOSIS — I480 Paroxysmal atrial fibrillation: Secondary | ICD-10-CM

## 2015-06-06 NOTE — Progress Notes (Signed)
Remote pacemaker transmission.   

## 2015-06-16 LAB — CUP PACEART REMOTE DEVICE CHECK
Battery Remaining Percentage: 29 %
Battery Voltage: 2.81 V
Brady Statistic RA Percent Paced: 1 % — CL
Brady Statistic RV Percent Paced: 96 %
Lead Channel Impedance Value: 400 Ohm
Lead Channel Impedance Value: 440 Ohm
Lead Channel Pacing Threshold Amplitude: 0.75 V
Lead Channel Pacing Threshold Pulse Width: 0.5 ms
Lead Channel Setting Pacing Amplitude: 2 V
Lead Channel Setting Pacing Amplitude: 2 V
Lead Channel Setting Pacing Pulse Width: 0.7 ms
MDC IDC MSMT LEADCHNL LV IMPEDANCE VALUE: 580 Ohm
MDC IDC MSMT LEADCHNL RV SENSING INTR AMPL: 12 mV
MDC IDC PG SERIAL: 2428292
MDC IDC SESS DTM: 20160902095922
MDC IDC SET LEADCHNL RV PACING AMPLITUDE: 2 V
MDC IDC SET LEADCHNL RV PACING PULSEWIDTH: 0.5 ms
MDC IDC SET LEADCHNL RV SENSING SENSITIVITY: 2.5 mV

## 2015-06-23 ENCOUNTER — Other Ambulatory Visit: Payer: Self-pay | Admitting: *Deleted

## 2015-06-23 MED ORDER — FUROSEMIDE 40 MG PO TABS: 40.0000 mg | ORAL_TABLET | Freq: Two times a day (BID) | ORAL | Status: DC

## 2015-06-26 ENCOUNTER — Other Ambulatory Visit (HOSPITAL_COMMUNITY): Payer: Self-pay | Admitting: *Deleted

## 2015-06-26 DIAGNOSIS — I25119 Atherosclerotic heart disease of native coronary artery with unspecified angina pectoris: Secondary | ICD-10-CM

## 2015-06-26 DIAGNOSIS — I5022 Chronic systolic (congestive) heart failure: Secondary | ICD-10-CM

## 2015-06-26 MED ORDER — CARVEDILOL 25 MG PO TABS: 50.0000 mg | ORAL_TABLET | Freq: Two times a day (BID) | ORAL | Status: DC

## 2015-06-26 MED ORDER — WARFARIN SODIUM 6 MG PO TABS: ORAL_TABLET | ORAL | Status: DC

## 2015-06-26 MED ORDER — HYDRALAZINE HCL 50 MG PO TABS
50.0000 mg | ORAL_TABLET | Freq: Three times a day (TID) | ORAL | Status: DC
Start: 2015-06-26 — End: 2016-09-01

## 2015-06-26 MED ORDER — POTASSIUM CHLORIDE CRYS ER 20 MEQ PO TBCR
40.0000 meq | EXTENDED_RELEASE_TABLET | Freq: Every day | ORAL | Status: DC
Start: 2015-06-26 — End: 2015-10-10

## 2015-06-26 MED ORDER — DIGOXIN 125 MCG PO TABS: 125.0000 ug | ORAL_TABLET | Freq: Every day | ORAL | Status: DC

## 2015-06-26 MED ORDER — SIMVASTATIN 20 MG PO TABS: 20.0000 mg | ORAL_TABLET | Freq: Every day | ORAL | Status: DC

## 2015-06-26 MED ORDER — SILDENAFIL CITRATE 100 MG PO TABS: ORAL_TABLET | ORAL | Status: DC

## 2015-06-27 ENCOUNTER — Other Ambulatory Visit (HOSPITAL_COMMUNITY): Payer: Self-pay | Admitting: *Deleted

## 2015-06-28 ENCOUNTER — Encounter: Payer: Self-pay | Admitting: Cardiology

## 2015-07-11 ENCOUNTER — Ambulatory Visit (INDEPENDENT_AMBULATORY_CARE_PROVIDER_SITE_OTHER): Payer: Medicare Other | Admitting: *Deleted

## 2015-07-11 DIAGNOSIS — I4891 Unspecified atrial fibrillation: Secondary | ICD-10-CM | POA: Diagnosis not present

## 2015-07-11 DIAGNOSIS — Z7901 Long term (current) use of anticoagulants: Secondary | ICD-10-CM | POA: Diagnosis not present

## 2015-07-11 LAB — POCT INR: INR: 3

## 2015-08-17 ENCOUNTER — Telehealth: Payer: Self-pay | Admitting: Internal Medicine

## 2015-08-17 NOTE — Telephone Encounter (Signed)
Pt wanted to be able to have appt at coumadin clinic and Dr Tanna Furry appt  to be same day but his appt at coumadin clinic is 6 week appt  so unable to change this appt and Dr Lovena Le is not in the office on the 8th so explained to pt that we will be unable to coordinate these appts at this time and he states understanding

## 2015-08-17 NOTE — Telephone Encounter (Signed)
New Message  Pt wanting to resch coum check for 11/23- pt wanted to make sure this is okay to postpone. Please call back and discuss.

## 2015-08-22 ENCOUNTER — Ambulatory Visit (INDEPENDENT_AMBULATORY_CARE_PROVIDER_SITE_OTHER): Payer: Medicare Other | Admitting: Pharmacist

## 2015-08-22 DIAGNOSIS — Z7901 Long term (current) use of anticoagulants: Secondary | ICD-10-CM | POA: Diagnosis not present

## 2015-08-22 DIAGNOSIS — I4891 Unspecified atrial fibrillation: Secondary | ICD-10-CM

## 2015-08-22 DIAGNOSIS — I480 Paroxysmal atrial fibrillation: Secondary | ICD-10-CM

## 2015-08-22 LAB — POCT INR: INR: 2.5

## 2015-08-23 ENCOUNTER — Telehealth: Payer: Self-pay | Admitting: Internal Medicine

## 2015-08-23 NOTE — Telephone Encounter (Signed)
Patient requested that we call Dr. Dorris Singh office at the [facility] in [place] [phone] needing clearance for CT Called office; they have received the clearance they needed and are going to contact patient

## 2015-08-23 NOTE — Telephone Encounter (Deleted)
Error

## 2015-09-01 ENCOUNTER — Other Ambulatory Visit (HOSPITAL_COMMUNITY): Payer: Self-pay | Admitting: Internal Medicine

## 2015-09-01 ENCOUNTER — Telehealth: Payer: Self-pay | Admitting: Internal Medicine

## 2015-09-01 NOTE — Telephone Encounter (Signed)
New message      *STAT* If patient is at the pharmacy, call can be transferred to refill team.   1. Which medications need to be refilled? (please list name of each medication and dose if known) Viagra  100 mg   2. Which pharmacy/location (including street and city if local pharmacy) is medication to be sent to? [facility] [address] [phone]  3. Do they need a 30 day or 90 day supply? 30 days

## 2015-09-01 NOTE — Telephone Encounter (Signed)
Please advise 

## 2015-09-06 ENCOUNTER — Ambulatory Visit (INDEPENDENT_AMBULATORY_CARE_PROVIDER_SITE_OTHER): Payer: Medicare Other | Admitting: Internal Medicine

## 2015-09-06 ENCOUNTER — Encounter: Payer: Self-pay | Admitting: Internal Medicine

## 2015-09-06 VITALS — BP 138/82 | HR 70 | Ht 75.0 in | Wt 287.4 lb

## 2015-09-06 DIAGNOSIS — I480 Paroxysmal atrial fibrillation: Secondary | ICD-10-CM | POA: Diagnosis not present

## 2015-09-06 DIAGNOSIS — I5022 Chronic systolic (congestive) heart failure: Secondary | ICD-10-CM

## 2015-09-06 DIAGNOSIS — Z950 Presence of cardiac pacemaker: Secondary | ICD-10-CM | POA: Diagnosis not present

## 2015-09-06 DIAGNOSIS — I25119 Atherosclerotic heart disease of native coronary artery with unspecified angina pectoris: Secondary | ICD-10-CM

## 2015-09-06 NOTE — Assessment & Plan Note (Signed)
His St. Jude biV PPM is working normally. Will recheck in several months.

## 2015-09-06 NOTE — Assessment & Plan Note (Signed)
His ventricular rate is well controlled. He will continue his current meds. 

## 2015-09-06 NOTE — Patient Instructions (Addendum)
Medication Instructions:  Your physician recommends that you continue on your current medications as directed. Please refer to the Current Medication list given to you today.   Labwork: N/A  Testing/Procedures: N/A  Follow-Up: Your physician wants you to follow-up in: 12 months with Dr. Lovena Le. You will receive a reminder letter in the mail two months in advance. If you don't receive a letter, please call our office to schedule the follow-up appointment.  Remote monitoring is used to monitor your Pacemaker  from home. This monitoring reduces the number of office visits required to check your device to one time per year. It allows [place] to keep an eye on the functioning of your device to ensure it is working properly. You are scheduled for a device check from home on 12/06/15. You may send your transmission at any time that day. If you have a wireless device, the transmission will be sent automatically. After your physician reviews your transmission, you will receive a postcard with your next transmission date.      Any Other Special Instructions Will Be Listed Below (If Applicable).     If you need a refill on your cardiac medications before your next appointment, please call your pharmacy.

## 2015-09-06 NOTE — Assessment & Plan Note (Signed)
His symptoms are class 2. He will continue his current meds.  

## 2015-09-06 NOTE — Progress Notes (Signed)
HPI [name] returns today for followup. He is a very pleasant 74 year old man with chronic systolic heart failure, atrial fibrillation, a nonischemic cardiomyopathy, status post biventricular pacemaker insertion. The patient denies medical noncompliance. In the interim he denies chest pain or sob. He has mild peripheral edema and admits to some sodium indiscretion. No syncope.  No Known Allergies   Current Outpatient Prescriptions  Medication Sig Dispense Refill  . acarbose (PRECOSE) 100 MG tablet Take 50 mg by mouth 3 (three) times daily with meals.    Marland Kitchen allopurinol (ZYLOPRIM) 300 MG tablet Take 300 mg by mouth daily.      Marland Kitchen aspirin 81 MG chewable tablet Chew 81 mg by mouth daily.    . benazepril (LOTENSIN) 20 MG tablet Take 10 mg by mouth daily.     . carvedilol (COREG) 25 MG tablet Take 25 mg by mouth 2 (two) times daily with a meal.    . digoxin (LANOXIN) 0.125 MG tablet Take 1 tablet (125 mcg total) by mouth daily. 90 tablet 6  . ferrous sulfate 325 (65 FE) MG tablet Take 650 mg by mouth daily with breakfast.    . furosemide (LASIX) 40 MG tablet Take 1 tablet (40 mg total) by mouth 2 (two) times daily. 60 tablet 10  . hydrALAZINE (APRESOLINE) 50 MG tablet Take 1 tablet (50 mg total) by mouth 3 (three) times daily. 270 tablet 6  . HYDROPHILIC EX Apply 1 application topically as needed (For dry skin/heels).    . hydroxyurea (HYDREA) 500 MG capsule Take 500 mg by mouth daily. May take with food to minimize GI side effects.    . insulin NPH (HUMULIN N,NOVOLIN N) 100 UNIT/ML injection Inject 10-20 Units into the skin as directed. Take 10 units 30 minutes before last meal and 20 units 1 hour before first meal    . potassium chloride SA (KLOR-CON M20) 20 MEQ tablet Take 2 tablets (40 mEq total) by mouth daily. 180 tablet 6  . sildenafil (VIAGRA) 100 MG tablet Take 100 mg by mouth daily as needed for erectile dysfunction (*DO NOT EXCEED 1 DOSE PER 24 HOUR PERIOD*).    Marland Kitchen sildenafil (VIAGRA) 100  MG tablet TAKE 1 TABLET BY MOUTH EVERY DAY AS NEEDED FOR ERECTILE DYSFUNCTION 10 tablet 2  . simvastatin (ZOCOR) 20 MG tablet Take 1 tablet (20 mg total) by mouth at bedtime. 90 tablet 6  . terazosin (HYTRIN) 2 MG capsule Take 2 mg by mouth at bedtime. For help with urination    . warfarin (COUMADIN) 6 MG tablet Take as directed by the coumadin clinic 120 tablet 3   No current facility-administered medications for this visit.     Past Medical History  Diagnosis Date  . Obesities, morbid ([place])   . Hypertension   . Hyperlipidemia   . Diabetes mellitus   . Polycythemia     vera requiring phlebotomy and treatment with hydroxyurea on a chronic bais. The patient followed by the [facility] for this.  . CHF (congestive heart failure) ([place])     secondary to nonischemic cardiomyopathy. EF initially 20-25% up to 50% in 2008. Echo 2/12 EF 20-25% . RHC 3/12: RA 21  RV 81/16/24  PA 81/36 (56) PCWP 33 (v to 43) PA sat 43% 48%.status post Medtronic biventricular ICD placement.(with Sprint Fidelis leads)-explanted--now with St.Jude Anthem BivPM   . 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 Atrial fibrillation (H[facility])     Chronic. On coumadin.  . Coronary  artery disease, non-occlusive     Cath 3/12: mLAD 70%, m-dLAD 40%; Dx 60%; mCFX 30-40%; pRCA 30%)    ROS:   All systems reviewed and negative except as noted in the HPI.   Past Surgical History  Procedure Laterality Date  . Cardiac catheterization    . Insert / replace / remove pacemaker      ICD - St. Jude     No family history on file.   Social History   Social History  . Marital Status: Married    Spouse Name: N/A  . Number of Children: N/A  . Years of Education: N/A   Occupational History  . Not on file.   Social History Main Topics  . Smoking status: Former Research scientist (life sciences)  . Smokeless tobacco: Not on file  . Alcohol Use: No  . Drug Use: No  . Sexual Activity: Not on file   Other Topics Concern  . Not on file    Social History Narrative   Retired   Married    Tobacco Use - No   Alcohol Use - No     BP 138/82 mmHg  Pulse 70  Ht 6\' 3"  (1.905 m)  Wt 287 lb 6.4 oz (130.364 kg)  BMI 35.92 kg/m2  Physical Exam:  Well appearing 74 year old man, NAD HEENT: Unremarkable Neck:  6 cm JVD, no thyromegally Lungs:  Clear with no wheezes, rhonchi, or increased work of breathing. Bilateral rales are present approximately 1/3 the way up. HEART:  Regular rate rhythm, no murmurs, no rubs, no clicks Abd:  soft, positive bowel sounds, no organomegally, no rebound, no guarding Ext:  2 plus pulses, no cyanosis, no clubbing, 1+ peripheral edema bilaterally Skin:  No rashes no nodules Neuro:  CN II through XII intact, motor grossly intact   DEVICE  Normal device function.  See PaceArt for details.   Assess/Plan:

## 2015-09-11 LAB — CUP PACEART INCLINIC DEVICE CHECK
Implantable Lead Implant Date: 20060828
Implantable Lead Location: 753858
Implantable Lead Location: 753859
Implantable Lead Location: 753860
Implantable Lead Model: 5076
MDC IDC LEAD IMPLANT DT: 20060828
MDC IDC LEAD IMPLANT DT: 20110209
MDC IDC LEAD MODEL: 4194
MDC IDC PG SERIAL: 2428292
MDC IDC SESS DTM: 20161128144135

## 2015-09-19 ENCOUNTER — Telehealth (HOSPITAL_COMMUNITY): Payer: Self-pay

## 2015-09-19 ENCOUNTER — Inpatient Hospital Stay (HOSPITAL_COMMUNITY)
Admission: EM | Admit: 2015-09-19 | Discharge: 2015-09-24 | DRG: 292 | Disposition: A | Discharge status: Home Health | Payer: Medicare Other | Attending: Cardiology | Admitting: Cardiology

## 2015-09-19 ENCOUNTER — Emergency Department (HOSPITAL_COMMUNITY): Payer: Medicare Other

## 2015-09-19 ENCOUNTER — Encounter (HOSPITAL_COMMUNITY): Payer: Self-pay | Admitting: *Deleted

## 2015-09-19 DIAGNOSIS — I429 Cardiomyopathy, unspecified: Secondary | ICD-10-CM | POA: Diagnosis present

## 2015-09-19 DIAGNOSIS — Z79899 Other long term (current) drug therapy: Secondary | ICD-10-CM

## 2015-09-19 DIAGNOSIS — I472 Ventricular tachycardia: Secondary | ICD-10-CM | POA: Diagnosis not present

## 2015-09-19 DIAGNOSIS — Z7901 Long term (current) use of anticoagulants: Secondary | ICD-10-CM | POA: Diagnosis not present

## 2015-09-19 DIAGNOSIS — I5043 Acute on chronic combined systolic (congestive) and diastolic (congestive) heart failure: Secondary | ICD-10-CM | POA: Insufficient documentation

## 2015-09-19 DIAGNOSIS — Z95810 Presence of automatic (implantable) cardiac defibrillator: Secondary | ICD-10-CM | POA: Diagnosis not present

## 2015-09-19 DIAGNOSIS — I482 Chronic atrial fibrillation, unspecified: Secondary | ICD-10-CM | POA: Diagnosis present

## 2015-09-19 DIAGNOSIS — Z794 Long term (current) use of insulin: Secondary | ICD-10-CM

## 2015-09-19 DIAGNOSIS — I248 Other forms of acute ischemic heart disease: Secondary | ICD-10-CM | POA: Diagnosis present

## 2015-09-19 DIAGNOSIS — E785 Hyperlipidemia, unspecified: Secondary | ICD-10-CM | POA: Diagnosis present

## 2015-09-19 DIAGNOSIS — R0902 Hypoxemia: Secondary | ICD-10-CM | POA: Diagnosis present

## 2015-09-19 DIAGNOSIS — Z6837 Body mass index (BMI) 37.0-37.9, adult: Secondary | ICD-10-CM | POA: Diagnosis not present

## 2015-09-19 DIAGNOSIS — R0602 Shortness of breath: Secondary | ICD-10-CM | POA: Diagnosis not present

## 2015-09-19 DIAGNOSIS — I2510 Atherosclerotic heart disease of native coronary artery without angina pectoris: Secondary | ICD-10-CM | POA: Diagnosis present

## 2015-09-19 DIAGNOSIS — D45 Polycythemia vera: Secondary | ICD-10-CM | POA: Diagnosis present

## 2015-09-19 DIAGNOSIS — I5023 Acute on chronic systolic (congestive) heart failure: Secondary | ICD-10-CM | POA: Diagnosis present

## 2015-09-19 DIAGNOSIS — Z7982 Long term (current) use of aspirin: Secondary | ICD-10-CM | POA: Diagnosis not present

## 2015-09-19 DIAGNOSIS — I110 Hypertensive heart disease with heart failure: Principal | ICD-10-CM | POA: Diagnosis present

## 2015-09-19 DIAGNOSIS — I509 Heart failure, unspecified: Secondary | ICD-10-CM | POA: Diagnosis not present

## 2015-09-19 DIAGNOSIS — Z9119 Patient's noncompliance with other medical treatment and regimen: Secondary | ICD-10-CM | POA: Diagnosis not present

## 2015-09-19 DIAGNOSIS — E119 Type 2 diabetes mellitus without complications: Secondary | ICD-10-CM | POA: Diagnosis present

## 2015-09-19 DIAGNOSIS — Z87891 Personal history of nicotine dependence: Secondary | ICD-10-CM | POA: Diagnosis not present

## 2015-09-19 LAB — BASIC METABOLIC PANEL
Anion gap: 9 (ref 5–15)
BUN: 15 mg/dL (ref 6–20)
CO2: 31 mmol/L (ref 22–32)
Calcium: 8.9 mg/dL (ref 8.9–10.3)
Chloride: 102 mmol/L (ref 101–111)
Creatinine, Ser: 1.33 mg/dL — ABNORMAL HIGH (ref 0.61–1.24)
GFR calc Af Amer: 59 mL/min — ABNORMAL LOW (ref >60)
GFR, EST NON AFRICAN AMERICAN: 51 mL/min — AB (ref >60)
GLUCOSE: 105 mg/dL — AB (ref 65–99)
POTASSIUM: 3.9 mmol/L (ref 3.5–5.1)
SODIUM: 142 mmol/L (ref 135–145)

## 2015-09-19 LAB — I-STAT TROPONIN, ED: Troponin i, poc: 0.17 ng/mL (ref 0.00–0.08)

## 2015-09-19 LAB — CBC
HEMATOCRIT: 44.1 % (ref 39.0–52.0)
HEMOGLOBIN: 13.7 g/dL (ref 13.0–17.0)
MCH: 30.6 pg (ref 26.0–34.0)
MCHC: 31.1 g/dL (ref 30.0–36.0)
MCV: 98.4 fL (ref 78.0–100.0)
Platelets: 238 10*3/uL (ref 150–400)
RBC: 4.48 MIL/uL (ref 4.22–5.81)
RDW: 22.1 % — AB (ref 11.5–15.5)
WBC: 7.5 10*3/uL (ref 4.0–10.5)

## 2015-09-19 LAB — BRAIN NATRIURETIC PEPTIDE: B Natriuretic Peptide: 705.6 pg/mL — ABNORMAL HIGH (ref 0.0–100.0)

## 2015-09-19 LAB — GLUCOSE, CAPILLARY
Glucose-Capillary: 72 mg/dL (ref 65–99)
Glucose-Capillary: 109 mg/dL — ABNORMAL HIGH (ref 65–99)

## 2015-09-19 LAB — PROTIME-INR
INR: 2.92 — AB (ref 0.00–1.49)
PROTHROMBIN TIME: 30 s — AB (ref 11.6–15.2)

## 2015-09-19 MED ORDER — BENAZEPRIL HCL 10 MG PO TABS
10.0000 mg | ORAL_TABLET | Freq: Every day | ORAL | Status: DC
  Administered 2015-09-20: 10 mg via ORAL
  Filled 2015-09-19: qty 1

## 2015-09-19 MED ORDER — WARFARIN SODIUM 5 MG PO TABS
5.0000 mg | ORAL_TABLET | Freq: Once | ORAL | Status: AC
  Administered 2015-09-19: 5 mg via ORAL
  Filled 2015-09-19: qty 1

## 2015-09-19 MED ORDER — ACARBOSE 50 MG PO TABS
50.0000 mg | ORAL_TABLET | Freq: Three times a day (TID) | ORAL | Status: DC
  Administered 2015-09-20 – 2015-09-24 (×14): 50 mg via ORAL
  Filled 2015-09-19 (×17): qty 1

## 2015-09-19 MED ORDER — ONDANSETRON HCL 4 MG/2ML IJ SOLN: 4.0000 mg | Freq: Four times a day (QID) | INTRAMUSCULAR | Status: DC | PRN

## 2015-09-19 MED ORDER — HYDRALAZINE HCL 50 MG PO TABS
50.0000 mg | ORAL_TABLET | Freq: Three times a day (TID) | ORAL | Status: DC
  Administered 2015-09-19 – 2015-09-20 (×2): 50 mg via ORAL
  Filled 2015-09-19 (×2): qty 1

## 2015-09-19 MED ORDER — SPIRONOLACTONE 25 MG PO TABS
12.5000 mg | ORAL_TABLET | Freq: Every day | ORAL | Status: DC
  Administered 2015-09-19 – 2015-09-23 (×5): 12.5 mg via ORAL
  Filled 2015-09-19 (×5): qty 1

## 2015-09-19 MED ORDER — FUROSEMIDE 10 MG/ML IJ SOLN
40.0000 mg | INTRAMUSCULAR | Status: AC
  Administered 2015-09-19: 40 mg via INTRAVENOUS
  Filled 2015-09-19: qty 4

## 2015-09-19 MED ORDER — INSULIN NPH (HUMAN) (ISOPHANE) 100 UNIT/ML ~~LOC~~ SUSP
20.0000 [IU] | Freq: Every day | SUBCUTANEOUS | Status: DC
  Administered 2015-09-20 – 2015-09-24 (×3): 20 [IU] via SUBCUTANEOUS
  Filled 2015-09-19: qty 10

## 2015-09-19 MED ORDER — SODIUM CHLORIDE 0.9 % IJ SOLN: 3.0000 mL | INTRAMUSCULAR | Status: DC | PRN

## 2015-09-19 MED ORDER — FUROSEMIDE 10 MG/ML IJ SOLN
80.0000 mg | Freq: Two times a day (BID) | INTRAMUSCULAR | Status: DC
  Administered 2015-09-19 – 2015-09-21 (×5): 80 mg via INTRAVENOUS
  Filled 2015-09-19 (×5): qty 8

## 2015-09-19 MED ORDER — CARVEDILOL 25 MG PO TABS
25.0000 mg | ORAL_TABLET | Freq: Two times a day (BID) | ORAL | Status: DC
  Administered 2015-09-20 – 2015-09-24 (×9): 25 mg via ORAL
  Filled 2015-09-19 (×7): qty 1

## 2015-09-19 MED ORDER — INSULIN NPH (HUMAN) (ISOPHANE) 100 UNIT/ML ~~LOC~~ SUSP: 10.0000 [IU] | SUBCUTANEOUS | Status: DC

## 2015-09-19 MED ORDER — INSULIN NPH (HUMAN) (ISOPHANE) 100 UNIT/ML ~~LOC~~ SUSP
10.0000 [IU] | Freq: Every day | SUBCUTANEOUS | Status: DC
  Administered 2015-09-21 – 2015-09-23 (×3): 10 [IU] via SUBCUTANEOUS
  Filled 2015-09-19: qty 10

## 2015-09-19 MED ORDER — SODIUM CHLORIDE 0.9 % IJ SOLN
3.0000 mL | Freq: Two times a day (BID) | INTRAMUSCULAR | Status: DC
  Administered 2015-09-19 – 2015-09-24 (×10): 3 mL via INTRAVENOUS

## 2015-09-19 MED ORDER — TERAZOSIN HCL 2 MG PO CAPS
2.0000 mg | ORAL_CAPSULE | Freq: Every day | ORAL | Status: DC
  Administered 2015-09-19 – 2015-09-23 (×5): 2 mg via ORAL
  Filled 2015-09-19 (×7): qty 1

## 2015-09-19 MED ORDER — DIGOXIN 125 MCG PO TABS
125.0000 ug | ORAL_TABLET | Freq: Every day | ORAL | Status: DC
  Administered 2015-09-19 – 2015-09-24 (×6): 125 ug via ORAL
  Filled 2015-09-19 (×6): qty 1

## 2015-09-19 MED ORDER — POTASSIUM CHLORIDE CRYS ER 20 MEQ PO TBCR
40.0000 meq | EXTENDED_RELEASE_TABLET | Freq: Every day | ORAL | Status: DC
  Administered 2015-09-20 – 2015-09-24 (×5): 40 meq via ORAL
  Filled 2015-09-19 (×5): qty 2

## 2015-09-19 MED ORDER — SIMVASTATIN 20 MG PO TABS
20.0000 mg | ORAL_TABLET | Freq: Every day | ORAL | Status: DC
  Administered 2015-09-19 – 2015-09-23 (×5): 20 mg via ORAL
  Filled 2015-09-19 (×5): qty 1

## 2015-09-19 MED ORDER — ALLOPURINOL 300 MG PO TABS
300.0000 mg | ORAL_TABLET | Freq: Every day | ORAL | Status: DC
  Administered 2015-09-20 – 2015-09-24 (×5): 300 mg via ORAL
  Filled 2015-09-19 (×5): qty 1

## 2015-09-19 MED ORDER — FUROSEMIDE 10 MG/ML IJ SOLN
40.0000 mg | Freq: Once | INTRAMUSCULAR | Status: AC
  Administered 2015-09-19: 40 mg via INTRAVENOUS
  Filled 2015-09-19: qty 4

## 2015-09-19 MED ORDER — HYDROXYUREA 500 MG PO CAPS
500.0000 mg | ORAL_CAPSULE | Freq: Every day | ORAL | Status: DC
  Administered 2015-09-20 – 2015-09-24 (×5): 500 mg via ORAL
  Filled 2015-09-19 (×6): qty 1

## 2015-09-19 MED ORDER — ACETAMINOPHEN 325 MG PO TABS: 650.0000 mg | ORAL_TABLET | ORAL | Status: DC | PRN

## 2015-09-19 MED ORDER — WARFARIN - PHARMACIST DOSING INPATIENT
Freq: Every day | Status: DC
  Administered 2015-09-20 – 2015-09-22 (×2)

## 2015-09-19 MED ORDER — SODIUM CHLORIDE 0.9 % IV SOLN: 250.0000 mL | INTRAVENOUS | Status: DC | PRN

## 2015-09-19 MED ORDER — FERROUS SULFATE 325 (65 FE) MG PO TABS
325.0000 mg | ORAL_TABLET | Freq: Every day | ORAL | Status: DC
  Administered 2015-09-20 – 2015-09-24 (×5): 325 mg via ORAL
  Filled 2015-09-19 (×5): qty 1

## 2015-09-19 MED ORDER — ASPIRIN 81 MG PO CHEW
81.0000 mg | CHEWABLE_TABLET | Freq: Every day | ORAL | Status: DC
  Administered 2015-09-20 – 2015-09-24 (×5): 81 mg via ORAL
  Filled 2015-09-19 (×6): qty 1

## 2015-09-19 MED ORDER — ISOSORBIDE MONONITRATE ER 30 MG PO TB24
30.0000 mg | ORAL_TABLET | Freq: Every day | ORAL | Status: DC
  Administered 2015-09-19 – 2015-09-20 (×2): 30 mg via ORAL
  Filled 2015-09-19 (×2): qty 1

## 2015-09-19 MED ORDER — ASPIRIN 81 MG PO CHEW
324.0000 mg | CHEWABLE_TABLET | Freq: Once | ORAL | Status: AC
  Administered 2015-09-19: 324 mg via ORAL
  Filled 2015-09-19: qty 4

## 2015-09-19 MED ORDER — INSULIN NPH (HUMAN) (ISOPHANE) 100 UNIT/ML ~~LOC~~ SUSP
2.5000 [IU] | Freq: Every day | SUBCUTANEOUS | Status: DC
  Administered 2015-09-19: 3 [IU] via SUBCUTANEOUS
  Filled 2015-09-19: qty 10

## 2015-09-19 NOTE — ED Notes (Signed)
Pt c/o SOB onset x2-3 days, pt denies CP, pt denies n/v/d, pt reports taking Lasix daily, pt speaks in full sentences, pt O2 sat 89% on RA, pt A&O x4, pt reports retaining fluid all over

## 2015-09-19 NOTE — ED Provider Notes (Signed)
CSN: [id number]     Arrival date & time 09/19/15  1233 History   First MD Initiated Contact with Patient 09/19/15 1356     Chief Complaint  Patient presents with  . Shortness of Breath     (Consider location/radiation/quality/duration/timing/severity/associated sxs/prior Treatment) HPI Comments: Patient presents to the emergency department with chief complaint of shortness of breath and leg swelling and abdominal swelling 1 week. He states that his symptoms have progressively worsened over the past 2-3 days. He states that this happens to him every year about this time. States that he takes Lasix, and has been compliant, but has not been having any relief. He denies shortness of breath or chest pain currently. He does not use oxygen at home, but is noted to be hypoxic to 89% on room air upon arrival. Patient is followed by Dr. Tempie Hoist. He is anticoagulated on warfarin for A. fib. There are no aggravating or alleviating factors. There are no other associated symptoms.  The history is provided by the patient. No language interpreter was used.    Past Medical History  Diagnosis Date  . Obesities, morbid ([place])   . Hypertension   . Hyperlipidemia   . Diabetes mellitus   . Polycythemia     vera requiring phlebotomy and treatment with hydroxyurea on a chronic bais. The patient followed by the [place] for this.  . CHF (congestive heart failure) ([place])     secondary to nonischemic cardiomyopathy. EF initially 20-25% up to 50% in 2008. Echo 2/12 EF 20-25% . RHC 3/12: RA 21  RV 81/16/24  PA 81/36 (56) PCWP 33 (v to 43) PA sat 43% 48%.status post Medtronic biventricular ICD placement.(with Sprint Fidelis leads)-explanted--now with St.Jude Anthem BivPM   . 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 Atrial fibrillation (HCC)     Chronic. On coumadin.  . Coronary artery disease, non-occlusive     Cath 3/12: mLAD 70%, m-dLAD 40%; Dx 60%; mCFX 30-40%; pRCA 30%)   Past Surgical  History  Procedure Laterality Date  . Cardiac catheterization    . Insert / replace / remove pacemaker      ICD - St. Jude   No family history on file. Social History  Substance Use Topics  . Smoking status: Former Research scientist (life sciences)  . Smokeless tobacco: None  . Alcohol Use: No    Review of Systems  Constitutional: Negative for fever and chills.  Respiratory: Positive for shortness of breath.   Cardiovascular: Positive for leg swelling. Negative for chest pain.  Gastrointestinal: Negative for nausea, vomiting, diarrhea and constipation.  Genitourinary: Negative for dysuria.  All other systems reviewed and are negative.     Allergies  Review of patient's allergies indicates no known allergies.  Home Medications   Prior to Admission medications   Medication Sig Start Date End Date Taking? Authorizing Provider  acarbose (PRECOSE) 100 MG tablet Take 50 mg by mouth 3 (three) times daily with meals.    Historical Provider, MD  allopurinol (ZYLOPRIM) 300 MG tablet Take 300 mg by mouth daily.      Historical Provider, MD  aspirin 81 MG chewable tablet Chew 81 mg by mouth daily.    Historical Provider, MD  benazepril (LOTENSIN) 20 MG tablet Take 10 mg by mouth daily.     Historical Provider, MD  carvedilol (COREG) 25 MG tablet Take 25 mg by mouth 2 (two) times daily with a meal.    Historical Provider, MD  digoxin (LANOXIN) 0.125 MG  tablet Take 1 tablet (125 mcg total) by mouth daily. 06/26/15   Jolaine Artist, MD  ferrous sulfate 325 (65 FE) MG tablet Take 650 mg by mouth daily with breakfast.    Historical Provider, MD  furosemide (LASIX) 40 MG tablet Take 1 tablet (40 mg total) by mouth 2 (two) times daily. 06/23/15   Evans Lance, MD  hydrALAZINE (APRESOLINE) 50 MG tablet Take 1 tablet (50 mg total) by mouth 3 (three) times daily. 06/26/15   Jolaine Artist, MD  HYDROPHILIC EX Apply 1 application topically as needed (For dry skin/heels).    Historical Provider, MD  hydroxyurea (HYDREA)  500 MG capsule Take 500 mg by mouth daily. May take with food to minimize GI side effects.    Historical Provider, MD  insulin NPH (HUMULIN N,NOVOLIN N) 100 UNIT/ML injection Inject 10-20 Units into the skin as directed. Take 10 units 30 minutes before last meal and 20 units 1 hour before first meal    Historical Provider, MD  potassium chloride SA (KLOR-CON M20) 20 MEQ tablet Take 2 tablets (40 mEq total) by mouth daily. 06/26/15   Jolaine Artist, MD  sildenafil (VIAGRA) 100 MG tablet Take 100 mg by mouth daily as needed for erectile dysfunction (*DO NOT EXCEED 1 DOSE PER 24 HOUR PERIOD*). 08/18/13   Jolaine Artist, MD  sildenafil (VIAGRA) 100 MG tablet TAKE 1 TABLET BY MOUTH EVERY DAY AS NEEDED FOR ERECTILE DYSFUNCTION 06/26/15   Jolaine Artist, MD  simvastatin (ZOCOR) 20 MG tablet Take 1 tablet (20 mg total) by mouth at bedtime. 06/26/15   Jolaine Artist, MD  terazosin (HYTRIN) 2 MG capsule Take 2 mg by mouth at bedtime. For help with urination    Historical Provider, MD  warfarin (COUMADIN) 6 MG tablet Take as directed by the coumadin clinic 06/26/15   Shaune Pascal Bensimhon, MD   BP 151/105 mmHg  Pulse 74  Temp(Src) 98.1 F (36.7 C) (Oral)  Ht 6\' 3"  (1.905 m)  Wt 134.129 kg  BMI 36.96 kg/m2  SpO2 96% Physical Exam  Constitutional: He is oriented to person, place, and time. He appears well-developed and well-nourished.  HENT:  Head: Normocephalic and atraumatic.  Eyes: Conjunctivae and EOM are normal. Pupils are equal, round, and reactive to light. Right eye exhibits no discharge. Left eye exhibits no discharge. No scleral icterus.  Neck: Normal range of motion. Neck supple. No JVD present.  Cardiovascular: Normal rate, regular rhythm and normal heart sounds.  Exam reveals no gallop and no friction rub.   No murmur heard. Pulmonary/Chest: Effort normal and breath sounds normal. No respiratory distress. He has no wheezes. He has no rales. He exhibits no tenderness.  Abdominal:  Soft. He exhibits no distension and no mass. There is no tenderness. There is no rebound and no guarding.  Musculoskeletal: Normal range of motion. He exhibits edema. He exhibits no tenderness.  3+ pitting edema in bilateral lower extremities  Neurological: He is alert and oriented to person, place, and time.  Skin: Skin is warm and dry.  Psychiatric: He has a normal mood and affect. His behavior is normal. Judgment and thought content normal.  Nursing note and vitals reviewed.   ED Course  Procedures (including critical care time) Labs Review Labs Reviewed  BASIC METABOLIC PANEL - Abnormal; Notable for the following:    Glucose, Bld 105 (*)    Creatinine, Ser 1.33 (*)    GFR calc non Af Amer 51 (*)  GFR calc Af Amer 59 (*)    All other components within normal limits  CBC - Abnormal; Notable for the following:    RDW 22.1 (*)    All other components within normal limits  I-STAT TROPOININ, ED - Abnormal; Notable for the following:    Troponin i, poc 0.17 (*)    All other components within normal limits  BRAIN NATRIURETIC PEPTIDE    Imaging Review Dg Chest 2 View  09/19/2015  CLINICAL DATA:  Shortness of breath for 1 day EXAM: CHEST  2 VIEW COMPARISON:  December 21, 2010 FINDINGS: There is mild scarring in the left mid lung and left base regions. There is no edema or consolidation. The heart is upper normal in size with pulmonary vascularity within normal limits. Pacemaker leads are attached to the right atrium, right ventricle, and left ventricle. No pneumothorax. No adenopathy. There is mild degenerative change in the thoracic spine. Stable mild elevation of the right hemidiaphragm persists. IMPRESSION: No edema or consolidation. Areas of mild atelectasis on the left. No change in cardiac silhouette. Electronically Signed   By: Lowella Grip III M.D.   On: 09/19/2015 13:37   I have personally reviewed and evaluated these images and lab results as part of my medical  decision-making.   EKG Interpretation   Date/Time:  Tuesday September 19 2015 12:46:00 EST Ventricular Rate:  70 PR Interval:    QRS Duration: 166 QT Interval:  470 QTC Calculation: 507 R Axis:   -82 Text Interpretation:  Ventricular-paced rhythm Abnormal ECG AGREE  Confirmed by Johnney Killian, MD, Jeannie Done [phone]) on 09/19/2015 2:10:42 PM      MDM   Final diagnoses:  Acute on chronic congestive heart failure, unspecified congestive heart failure type [facility])    Patient with CHF exacerbation.  Increasing fluid retention x 7 days.  New O2 requirement.  89% on RA. 94-95% on 2L.  Large fluid retention on lower extremities and abdomen.    Troponin is 0.17, likely 2/2 to increased demand.  Will give lasix 40 mg in ED.  No ischemic changes on EKG.  Will consult cardiology for admission.  3:32 PM Patient discussed with cards PA.  Cardiology to admit.  Montine Circle, PA-C 09/19/15 1532  Charlesetta Shanks, MD 09/27/15 [phone]

## 2015-09-19 NOTE — ED Notes (Signed)
Call from lab Trop. 0.17

## 2015-09-19 NOTE — H&P (Addendum)
Advanced Heart Failure Team History and Physical Note   Referring Physician: Dr. Johnney Killian Primary HF Cardiologist:  Dr. Haroldine Laws  EP: Dr Lovena Le.  Reason for Admission: Acute on Chronic systolic CHF   HPI:   [name] is a 74 y.o. male with a h/o moderate nonobstructive CAD, nonischemic cardiomyopathy with CHF, HTN, atrial arrythmias (now chronic AF) and obesity. He initially had an EF of 20-25% and single chamber ICD was implanted. EF then recovered to 50% (echo 2008) and had ICD explanted and BiV pacer placed due to bradycardia. Had routine follow-up echo in 08/09/10 and showed EF back down to 25-30% which worsened again on Echo 12/06/10 EF 20-25% despite increase in HF medications.  So patient underwent R&L cath on 12/21/10, full results as below.  Most recent Echo 07/2014 EF 30-35%  He was last seen in the HF clinic 07/28/14 and has been non compliant with follow up since then.   He was seen by Dr Lovena Le more recently, on 09/06/15 and was thought to be stable with no changes in medications. He weighed 287 lbs at that visit.    He called the HF clinic the morning of 09/19/15 with c/o SOB, fatigue, and edema stating his weight had slowly climbed from 260 lbs to 290.  We were unable to work him in acutely so recommended he come to the ER for further evaluation.  Pertinent ED labs include K 3.9, Cr 1.33, BNP 705.6.  Initial troponin 0.17, will follow. CXR showed no edema or consolidation. Areas of mild atelactasis on the left with no change in cardiac silhouette compared to CXR 12/21/10.  He states he has been compliant with his medications, but symptoms have been worsening for several weeks.  Has gotten to the point where he is SOB with only a few steps.  Can get to the bathroom in his house from his chair, but SOB walking back to chair.  Takes all medications as directed.  Pees well with lasix.  Says fluid intake close to 50 oz a day. Watches salt closely.  Has been managing diabetes so well has  recently had decrease in his insulin. Denies Orthopnea, or SOB at rest.  Has had no CP, lightheadedness, or dizziness.  Says this happens "every year" and needs to come in for diuresis.  Doesn't follow up when he feels well.  He is unsure what brought this fluid overload on.   Review of Systems: [y] = yes, [ ]  = no   General: Weight gain [y]; Weight loss [ ] ; Anorexia [ ] ; Fatigue [Y]; Fever [ ] ; Chills [ ] ; Weakness [ ]   Cardiac: Chest pain/pressure [ ] ; Resting SOB [ ] ; Exertional SOB [Y]; Orthopnea [ ] ; Pedal Edema [y]; Palpitations [ ] ; Syncope [ ] ; Presyncope [ ] ; Paroxysmal nocturnal dyspnea[ ]   Pulmonary: Cough [ ] ; Wheezing[ ] ; Hemoptysis[ ] ; Sputum [ ] ; Snoring [ ]   GI: Vomiting[ ] ; Dysphagia[ ] ; Melena[ ] ; Hematochezia [ ] ; Heartburn[ ] ; Abdominal pain [ ] ; Constipation [ ] ; Diarrhea [ ] ; BRBPR [ ]   GU: Hematuria[ ] ; Dysuria [ ] ; Nocturia[ ]   Vascular: Pain in legs with walking [ ] ; Pain in feet with lying flat [ ] ; Non-healing sores [ ] ; Stroke [ ] ; TIA [ ] ; Slurred speech [ ] ;  Neuro: Headaches[ ] ; Vertigo[ ] ; Seizures[ ] ; Paresthesias[ ] ;Blurred vision [ ] ; Diplopia [ ] ; Vision changes [ ]   Ortho/Skin: Arthritis [y]; Joint pain [y]; Muscle pain [ ] ; Joint swelling [ ] ; Back Pain [ ] ;  Rash [ ]   Psych: Depression[ ] ; Anxiety[ ]   Heme: Bleeding problems [ ] ; Clotting disorders [ ] ; Anemia [ ]   Endocrine: Diabetes [ ] ; Thyroid dysfunction[ ]   R/LHC 12/21/10 1) Mod nonobs CAD (mLAD 70%, m-dLAD 40%; oDx 60%; mCFX 30-40%; pRCA 30%)  2) RA 21 RV 81/16/24 PA 81/36 (56) PCWP 33 (v to 43) PA sat 43% 48%, Fick 4.2/1.6   Home Medications Prior to Admission medications   Medication Sig Start Date End Date Taking? Authorizing Provider  acarbose (PRECOSE) 100 MG tablet Take 50 mg by mouth 3 (three) times daily with meals.   Yes Historical Provider, MD  allopurinol (ZYLOPRIM) 300 MG tablet Take 300 mg by mouth daily.     Yes Historical Provider, MD  aspirin 81 MG chewable tablet Chew 81 mg by  mouth daily.   Yes Historical Provider, MD  benazepril (LOTENSIN) 20 MG tablet Take 10 mg by mouth daily.    Yes Historical Provider, MD  carvedilol (COREG) 25 MG tablet Take 25 mg by mouth 2 (two) times daily with a meal.   Yes Historical Provider, MD  digoxin (LANOXIN) 0.125 MG tablet Take 1 tablet (125 mcg total) by mouth daily. 06/26/15  Yes Jolaine Artist, MD  ferrous sulfate 325 (65 FE) MG tablet Take 325 mg by mouth daily with breakfast.    Yes Historical Provider, MD  furosemide (LASIX) 40 MG tablet Take 1 tablet (40 mg total) by mouth 2 (two) times daily. 06/23/15  Yes Evans Lance, MD  hydrALAZINE (APRESOLINE) 50 MG tablet Take 1 tablet (50 mg total) by mouth 3 (three) times daily. 06/26/15  Yes Jolaine Artist, MD  HYDROPHILIC EX Apply 1 application topically daily as needed (For dry skin/heels).    Yes Historical Provider, MD  hydroxyurea (HYDREA) 500 MG capsule Take 500 mg by mouth daily. May take with food to minimize GI side effects.   Yes Historical Provider, MD  insulin NPH (HUMULIN N,NOVOLIN N) 100 UNIT/ML injection Inject 10-20 Units into the skin See admin instructions. Take 10 units 30 minutes before last meal and 20 units 1 hour before first meal, 5 units in evening, if BS >110, if BS <110, give 2.5units   Yes Historical Provider, MD  potassium chloride SA (KLOR-CON M20) 20 MEQ tablet Take 2 tablets (40 mEq total) by mouth daily. 06/26/15  Yes Shaune Pascal Bensimhon, MD  sildenafil (VIAGRA) 100 MG tablet TAKE 1 TABLET BY MOUTH EVERY DAY AS NEEDED FOR ERECTILE DYSFUNCTION 06/26/15  Yes Jolaine Artist, MD  simvastatin (ZOCOR) 20 MG tablet Take 1 tablet (20 mg total) by mouth at bedtime. 06/26/15  Yes Jolaine Artist, MD  terazosin (HYTRIN) 2 MG capsule Take 2 mg by mouth at bedtime. For help with urination   Yes Historical Provider, MD  warfarin (COUMADIN) 6 MG tablet Take as directed by the coumadin clinic Patient taking differently: Take 6 mg by mouth daily. Take as directed  by the coumadin clinic 06/26/15  Yes Jolaine Artist, MD    Past Medical History: Past Medical History  Diagnosis Date  . Obesities, morbid ([place])   . Hypertension   . Hyperlipidemia   . Diabetes mellitus   . Polycythemia     vera requiring phlebotomy and treatment with hydroxyurea on a chronic bais. The patient followed by the [place] for this.  . CHF (congestive heart failure) ([place])     secondary to nonischemic cardiomyopathy. EF initially 20-25% up to 50% in 2008. Echo 2/12  EF 20-25% . RHC 3/12: RA 21  RV 81/16/24  PA 81/36 (56) PCWP 33 (v to 43) PA sat 43% 48%.status post Medtronic biventricular ICD placement.(with Sprint Fidelis leads)-explanted--now with St.Jude Anthem BivPM   . 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 Atrial fibrillation (HCC)     Chronic. On coumadin.  . Coronary artery disease, non-occlusive     Cath 3/12: mLAD 70%, m-dLAD 40%; Dx 60%; mCFX 30-40%; pRCA 30%)    Past Surgical History: Past Surgical History  Procedure Laterality Date  . Cardiac catheterization    . Insert / replace / remove pacemaker      ICD - St. Jude    Family History: Pt has no family history of CAD or dilated CMP.    Social History: Social History   Social History  . Marital Status: Married    Spouse Name: N/A  . Number of Children: N/A  . Years of Education: N/A   Social History Main Topics  . Smoking status: Former Research scientist (life sciences)  . Smokeless tobacco: None  . Alcohol Use: No  . Drug Use: No  . Sexual Activity: Not Asked   Other Topics Concern  . None   Social History Narrative   Retired   Married    Tobacco Use - No   Alcohol Use - No    Allergies:  No Known Allergies  Objective:    Vital Signs:   Temp:  [98.1 F (36.7 C)] 98.1 F (36.7 C) (12/06 1245) Pulse Rate:  [69-90] 81 (12/06 1430) Resp:  [22-23] 23 (12/06 1430) BP: (136-153)/(87-105) 151/87 mmHg (12/06 1430) SpO2:  [94 %-96 %] 94 % (12/06 1430) Weight:  [290 lb (131.543 kg)-295  lb 11.2 oz (134.129 kg)] 295 lb 11.2 oz (134.129 kg) (12/06 1257)   Filed Weights   09/19/15 1245 09/19/15 1257  Weight: 290 lb (131.543 kg) 295 lb 11.2 oz (134.129 kg)    Physical Exam:  General: Pleasant, NAD. Neuro: Alert and oriented X 3. Moves all extremities spontaneously. Psych: Normal affect. HEENT: Without mass or lesion. Neck: Supple without bruits. JVD to ear. No bruit.  No thyromegaly or nodule noted. Lungs: Bibasilar crackles. Heart: RRR no s3, s4, or murmurs. Abdomen: Obese, Soft, non-tender, mildly-distended, BS + x 4. No HSM. Without Bruit or Mass. Extremities: No clubbing, cyanosis. DP/PT/Radials 2+ and equal bilaterally. 1-2+ edema into thighs.  Telemetry: a sensed v paced 70s, underlying a fib.   Labs: Basic Metabolic Panel:  Recent Labs Lab 09/19/15 1245  NA 142  K 3.9  CL 102  CO2 31  GLUCOSE 105*  BUN 15  CREATININE 1.33*  CALCIUM 8.9    Liver Function Tests: No results for input(s): AST, ALT, ALKPHOS, BILITOT, PROT, ALBUMIN in the last 168 hours. No results for input(s): LIPASE, AMYLASE in the last 168 hours. No results for input(s): AMMONIA in the last 168 hours.  CBC:  Recent Labs Lab 09/19/15 1245  WBC 7.5  HGB 13.7  HCT 44.1  MCV 98.4  PLT 238    Cardiac Enzymes: No results for input(s): CKTOTAL, CKMB, CKMBINDEX, TROPONINI in the last 168 hours.  BNP: BNP (last 3 results)  Recent Labs  09/19/15 1245  BNP 705.6*    ProBNP (last 3 results) No results for input(s): PROBNP in the last 8760 hours.   CBG: No results for input(s): GLUCAP in the last 168 hours.  Coagulation Studies:  Recent Labs  09/19/15 1435  LABPROT 30.0*  INR  2.92*    Other results: EKG: a sensed v paced 70s, underlying a fib  Imaging: Dg Chest 2 View  09/19/2015  CLINICAL DATA:  Shortness of breath for 1 day EXAM: CHEST  2 VIEW COMPARISON:  December 21, 2010 FINDINGS: There is mild scarring in the left mid lung and left base regions. There is  no edema or consolidation. The heart is upper normal in size with pulmonary vascularity within normal limits. Pacemaker leads are attached to the right atrium, right ventricle, and left ventricle. No pneumothorax. No adenopathy. There is mild degenerative change in the thoracic spine. Stable mild elevation of the right hemidiaphragm persists. IMPRESSION: No edema or consolidation. Areas of mild atelectasis on the left. No change in cardiac silhouette. Electronically Signed   By: Lowella Grip III M.D.   On: 09/19/2015 13:37      Assessment/Plan   1. Acute on chronic systolic CHF, Echo [id number] EF 30-35% - Will admit to tele for IV diuresis with 80 mg IV BID. Pt prefers foley catheter to frequent bathroom visits. - Will add Imdur 30 mg daily. - Continue other current medications, including goal dose Coreg. - Repeat Echo 2. Chronic Afib - Continue coumadin. INR 2.9. Dosing per pharm. 3. HTN - As above will add Imdur.   4. DM - Continue home regimen  Length of Stay:    Shirley Friar PA-C 09/19/2015, 3:28 PM  Advanced Heart Failure Team Pager [phone] (M-F; 7a - 4p)  Please contact [facility] for night-coverage after hours (4p -7a ) and weekends on [email or web address]  Patient seen with PA, agree with the above note.  1. Acute on chronic systolic CHF: Nonischemic cardiomyopathy.  EF 30-35% on last echo in 10/15.  Sounds like gradual onset of symptoms, was volume overloaded when he saw Dr Lovena Le last month.  No acute trigger.  He is markedly volume overloaded today, weight up a lot from baseline.  He has St Jude CRT-D device.  - Lasix 80 mg IV bid, give 2 doses today.  - Continue Coreg at current dose (BP running high).   - Continue home hydralazine, will likely be able to titrate up. Will add Imdur 30 mg daily, will need to be careful with this at home if he is going to use Viagra in the future.  - Continue benazepril, could contemplate transitioning to [facility] this admission.   - Add spironolactone 12.5 daily.  - Repeat echo.  2. CAD: Moderate nonobstructive CAD in past.  Mild troponin elevation, no chest pain.  Suspect demand ischemia in the setting of volume overload. Continue statin, does not need aspirin given warfarin use and stable CAD. 3. Atrial fibrillation: Chronic.  Continue warfarin. INR therapeutic.   Loralie Champagne 09/19/2015 4:21 PM

## 2015-09-19 NOTE — Telephone Encounter (Signed)
Wife called reporting husband is fluid overloaded. Short of breath, fatigued, swollen. States his weight today is 290 lbs and that he is usually 260 lbs. States this has gone on for a couple of weeks. Has not been seen in CHF clinic since 07/2014, and no availability today. Advised to go to ED to be treated. Aware and agreeable.  Renee Pain

## 2015-09-19 NOTE — Progress Notes (Signed)
ANTICOAGULATION CONSULT NOTE - Initial Consult  Pharmacy Consult:  Coumadin Indication: atrial fibrillation  No Known Allergies  Patient Measurements: Height: 6\' 1"  (185.4 cm) Weight: 291 lb 3.6 oz (132.1 kg) IBW/kg (Calculated) : 79.9  Vital Signs: Temp: 98.7 F (37.1 C) (12/06 1723) Temp Source: Oral (12/06 1723) BP: 151/81 mmHg (12/06 1723) Pulse Rate: 83 (12/06 1723)  Labs:  Recent Labs  09/19/15 1245 09/19/15 1435  HGB 13.7  --   HCT 44.1  --   PLT 238  --   LABPROT  --  30.0*  INR  --  2.92*  CREATININE 1.33*  --     Estimated Creatinine Clearance: 68.4 mL/min (by C-G formula based on Cr of 1.33).   Medical History: Past Medical History  Diagnosis Date  . Obesities, morbid ([place])   . Hypertension   . Hyperlipidemia   . Diabetes mellitus   . Polycythemia     vera requiring phlebotomy and treatment with hydroxyurea on a chronic bais. The patient followed by the [place] for this.  . CHF (congestive heart failure) ([place])     secondary to nonischemic cardiomyopathy. EF initially 20-25% up to 50% in 2008. Echo 2/12 EF 20-25% . RHC 3/12: RA 21  RV 81/16/24  PA 81/36 (56) PCWP 33 (v to 43) PA sat 43% 48%.status post Medtronic biventricular ICD placement.(with Sprint Fidelis leads)-explanted--now with St.Jude Anthem BivPM   . 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 Atrial fibrillation (HCC)     Chronic. On coumadin.  . Coronary artery disease, non-occlusive     Cath 3/12: mLAD 70%, m-dLAD 40%; Dx 60%; mCFX 30-40%; pRCA 30%)       Assessment: 85 YOM admitted with AoC systolic CHF and Pharmacy consulted to continue Coumadin from PTA for history of Afib.  INR on admit is therapeutic and toward the high end of goal.  No bleeding reported.  Home regimen: 6mg  PO daily, last dose 12/5   Goal of Therapy:  INR 2-3    Plan:  - Coumadin 5 mg PO today - Daily PT / INR    Tyrese Capriotti D. Mina Marble, PharmD, BCPS Pager:  [phone] 09/19/2015, 5:51  PM

## 2015-09-20 ENCOUNTER — Other Ambulatory Visit (HOSPITAL_COMMUNITY): Payer: Medicare Other

## 2015-09-20 ENCOUNTER — Inpatient Hospital Stay (HOSPITAL_COMMUNITY): Payer: Medicare Other

## 2015-09-20 DIAGNOSIS — I110 Hypertensive heart disease with heart failure: Principal | ICD-10-CM

## 2015-09-20 DIAGNOSIS — I509 Heart failure, unspecified: Secondary | ICD-10-CM

## 2015-09-20 LAB — GLUCOSE, CAPILLARY
GLUCOSE-CAPILLARY: 69 mg/dL (ref 65–99)
GLUCOSE-CAPILLARY: 74 mg/dL (ref 65–99)
Glucose-Capillary: 87 mg/dL (ref 65–99)
Glucose-Capillary: 126 mg/dL — ABNORMAL HIGH (ref 65–99)
Glucose-Capillary: 84 mg/dL (ref 65–99)
Glucose-Capillary: 81 mg/dL (ref 65–99)

## 2015-09-20 LAB — BASIC METABOLIC PANEL
Anion gap: 10 (ref 5–15)
BUN: 15 mg/dL (ref 6–20)
CALCIUM: 8.5 mg/dL — AB (ref 8.9–10.3)
CO2: 33 mmol/L — ABNORMAL HIGH (ref 22–32)
Chloride: 100 mmol/L — ABNORMAL LOW (ref 101–111)
Creatinine, Ser: 1.28 mg/dL — ABNORMAL HIGH (ref 0.61–1.24)
GFR calc Af Amer: >60 mL/min (ref >60)
GFR, EST NON AFRICAN AMERICAN: 53 mL/min — AB (ref >60)
GLUCOSE: 68 mg/dL (ref 65–99)
POTASSIUM: 3.2 mmol/L — AB (ref 3.5–5.1)
Sodium: 143 mmol/L (ref 135–145)

## 2015-09-20 LAB — PROTIME-INR
INR: 2.79 — ABNORMAL HIGH (ref 0.00–1.49)
Prothrombin Time: 29 seconds — ABNORMAL HIGH (ref 11.6–15.2)

## 2015-09-20 LAB — DIGOXIN LEVEL: DIGOXIN LVL: 0.6 ng/mL — AB (ref 0.8–2.0)

## 2015-09-20 MED ORDER — HYDRALAZINE HCL 50 MG PO TABS: 75.0000 mg | ORAL_TABLET | Freq: Three times a day (TID) | ORAL | Status: DC

## 2015-09-20 MED ORDER — BENAZEPRIL HCL 20 MG PO TABS
20.0000 mg | ORAL_TABLET | Freq: Every day | ORAL | Status: DC
  Administered 2015-09-21 – 2015-09-24 (×4): 20 mg via ORAL
  Filled 2015-09-20 (×6): qty 1

## 2015-09-20 MED ORDER — POTASSIUM CHLORIDE CRYS ER 20 MEQ PO TBCR
20.0000 meq | EXTENDED_RELEASE_TABLET | Freq: Once | ORAL | Status: AC
  Administered 2015-09-20: 20 meq via ORAL
  Filled 2015-09-20: qty 1

## 2015-09-20 MED ORDER — WARFARIN SODIUM 3 MG PO TABS
6.0000 mg | ORAL_TABLET | Freq: Once | ORAL | Status: AC
  Administered 2015-09-20: 6 mg via ORAL
  Filled 2015-09-20 (×2): qty 2

## 2015-09-20 MED ORDER — HYDRALAZINE HCL 50 MG PO TABS
50.0000 mg | ORAL_TABLET | Freq: Three times a day (TID) | ORAL | Status: DC
  Administered 2015-09-20 – 2015-09-24 (×11): 50 mg via ORAL
  Filled 2015-09-20 (×11): qty 1

## 2015-09-20 NOTE — Progress Notes (Signed)
ANTICOAGULATION CONSULT NOTE - Follow Up Consult  Pharmacy Consult for warfarin Indication: atrial fibrillation  No Known Allergies  Patient Measurements: Height: 6\' 1"  (185.4 cm) Weight: 282 lb 3.2 oz (128.005 kg) (scale b) IBW/kg (Calculated) : 79.9  Vital Signs: Temp: 97.6 F (36.4 C) (12/07 1135) Temp Source: Oral (12/07 1135) BP: 142/89 mmHg (12/07 1135) Pulse Rate: 81 (12/07 1135)  Labs:  Recent Labs  09/19/15 1245 09/19/15 1435 09/20/15 0317  HGB 13.7  --   --   HCT 44.1  --   --   PLT 238  --   --   LABPROT  --  30.0* 29.0*  INR  --  2.92* 2.79*  CREATININE 1.33*  --  1.28*    Estimated Creatinine Clearance: 69.9 mL/min (by C-G formula based on Cr of 1.28).   Assessment: 74 y/o male who presented to the ED with SOB and fluid overload, admitted for CHF exacerbation. He takes chronic warfarin for Afib. INR is therapeutic at 2.79. No bleeding noted, CBC normal.  PTA regimen: 6 mg daily  Goal of Therapy:  INR 2-3 Monitor platelets by anticoagulation protocol: Yes   Plan:  - Warfarin 6 mg PO tonight - INR daily - Monitor for s/sx of bleeding  [facility], Pharm.D., BCPS Clinical Pharmacist Pager: [phone] 09/20/2015 12:15 PM

## 2015-09-20 NOTE — Progress Notes (Signed)
  Echocardiogram 2D Echocardiogram has been performed.  [name] M 09/20/2015, 4:30 PM

## 2015-09-20 NOTE — Progress Notes (Signed)
Inpatient Diabetes Program Recommendations  AACE/ADA: New Consensus Statement on Inpatient Glycemic Control (2015)  Target Ranges:  Prepandial:   less than 140 mg/dL      Peak postprandial:   less than 180 mg/dL (1-2 hours)      Critically ill patients:  140 - 180 mg/dL  Results for [name], [name] (MRN [id number]) as of 09/20/2015 12:24  Ref. Range 09/19/2015 18:56 09/19/2015 22:05 09/20/2015 06:22 09/20/2015 07:02 09/20/2015 11:33  Glucose-Capillary Latest Ref Range: 65-99 mg/dL 72 109 (H) 69 87 126 (H)   Review of Glycemic Control  Diabetes history: DM2 Outpatient Diabetes medications: NPH 20 units QAM before breakfast, NPH 10 units QPM before supper, NPH 5 units QHS, Acarbose 50 mg TID Current orders for Inpatient glycemic control: NPH 20 units QAM with breakfast, NPH 10 units QPM with supper, NPH 3-5 units QHS, Acarbose 50 mg TID  Inpatient Diabetes Program Recommendations: Correction (SSI): While inpatient, please consider ordering CBGs with Novolog sensitive correction scale ACHS.   Thanks, Barnie Alderman, RN, MSN, CDE Diabetes Coordinator Inpatient Diabetes Program [phone] (Team Pager from [place] to [place]) [phone] ([place] office) [phone] [facility] office) [phone] [facility] office)

## 2015-09-20 NOTE — Progress Notes (Signed)
Advanced Heart Failure Rounding Note  Primary Cardiologist: Dr Haroldine Laws EP: Dr Lovena Le  Subjective:    Says breathing feels "much better" this am and "stomach has gone down."  Denies CP, lightheadedness, or dizziness.   Out ~ 2L x 24 hrs. Weight shows down 9 lbs, ? Accuracy. Likely standing vs bed weight.   Objective:   Weight Range: 282 lb 3.2 oz (128.005 kg) Body mass index is 37.24 kg/(m^2).   Vital Signs:   Temp:  [97.5 F (36.4 C)-98.7 F (37.1 C)] 97.6 F (36.4 C) (12/07 0359) Pulse Rate:  [69-90] 81 (12/07 0359) Resp:  [16-23] 20 (12/07 0359) BP: (136-165)/(81-105) 142/83 mmHg (12/07 0359) SpO2:  [84 %-99 %] 96 % (12/07 0359) Weight:  [282 lb 3.2 oz (128.005 kg)-295 lb 11.2 oz (134.129 kg)] 282 lb 3.2 oz (128.005 kg) (12/07 0359) Last BM Date: 09/19/15  Weight change: Filed Weights   09/19/15 1257 09/19/15 1723 09/20/15 0359  Weight: 295 lb 11.2 oz (134.129 kg) 291 lb 3.6 oz (132.1 kg) 282 lb 3.2 oz (128.005 kg)    Intake/Output:   Intake/Output Summary (Last 24 hours) at 09/20/15 0817 Last data filed at 09/20/15 0600  Gross per 24 hour  Intake    720 ml  Output   2700 ml  Net  -1980 ml     Physical Exam:  General: Pleasant, NAD. Psych: Normal affect. HEENT: Normal, without mass or lesion. Neck: Supple, no bruits. JVD to jaw. Carotids 2+. No lymphadenopathy/thyromegaly appreciated. Heart: PMI laterally displaced. RRR no s3, s4, or murmurs. Lungs: Resp regular and unlabored, CTA. Abdomen: obese Soft, NT, ND, No HSM, BS + x 4.  Extremities: No clubbing, cyanosis. 1-2+ edema to thighs bilaterally. Neuro: Alert and oriented X 3. Moves all extremities spontaneously. Normal pedal pulses  Telemetry: a sensed v paced 70s, a fib underlying rhythm.  Labs: CBC  Recent Labs  09/19/15 1245  WBC 7.5  HGB 13.7  HCT 44.1  MCV 98.4  PLT 99991111   Basic Metabolic Panel  Recent Labs  09/19/15 1245 09/20/15 0317  NA 142 143  K 3.9 3.2*  CL  102 100*  CO2 31 33*  GLUCOSE 105* 68  BUN 15 15  CREATININE 1.33* 1.28*  CALCIUM 8.9 8.5*    BNP: BNP (last 3 results)  Recent Labs  09/19/15 1245  BNP 705.6*    Other results:    Imaging/Studies:  Dg Chest 2 View  09/19/2015  CLINICAL DATA:  Shortness of breath for 1 day EXAM: CHEST  2 VIEW COMPARISON:  December 21, 2010 FINDINGS: There is mild scarring in the left mid lung and left base regions. There is no edema or consolidation. The heart is upper normal in size with pulmonary vascularity within normal limits. Pacemaker leads are attached to the right atrium, right ventricle, and left ventricle. No pneumothorax. No adenopathy. There is mild degenerative change in the thoracic spine. Stable mild elevation of the right hemidiaphragm persists. IMPRESSION: No edema or consolidation. Areas of mild atelectasis on the left. No change in cardiac silhouette. Electronically Signed   By: Lowella Grip III M.D.   On: 09/19/2015 13:37     Latest Echo  Latest Cath   Medications:     Scheduled Medications: . acarbose  50 mg Oral TID WC  . allopurinol  300 mg Oral Daily  . aspirin  81 mg Oral Daily  . benazepril  10 mg Oral Daily  . carvedilol  25 mg Oral BID WC  .  digoxin  125 mcg Oral Daily  . ferrous sulfate  325 mg Oral Q breakfast  . furosemide  80 mg Intravenous BID  . hydrALAZINE  50 mg Oral TID  . hydroxyurea  500 mg Oral Daily  . insulin NPH Human  10 Units Subcutaneous QAC supper  . insulin NPH Human  20 Units Subcutaneous QAC breakfast  . insulin NPH Human  3-5 Units Subcutaneous QHS  . isosorbide mononitrate  30 mg Oral Daily  . potassium chloride SA  40 mEq Oral Daily  . simvastatin  20 mg Oral QHS  . sodium chloride  3 mL Intravenous Q12H  . spironolactone  12.5 mg Oral Daily  . terazosin  2 mg Oral QHS  . Warfarin - Pharmacist Dosing Inpatient   Does not apply q1800     Infusions:     PRN Medications:  sodium chloride, acetaminophen, ondansetron  (ZOFRAN) IV, sodium chloride   Assessment/Plan   1. Acute on chronic systolic CHF: Nonischemic cardiomyopathy. EF 30-35% ECHO 07/2014. S/p BIV pacer 2. Non-obstructive CAD 3. Atrial fibrillation: Chronic.with tachy-brady s/p CRT-P 4. Morbid obesity 5. HTN  Repeat echo pending.  INR stable. Coumadin dosing per pharm.  He remains volume overloaded on exam with edema into thighs. Continue Lasix 80 mg IV BID. Unclear baseline weight. Creatinine stable.  Supp K.  Added Arlyce Harman. Will keep off imdur for now with adamant that he will continue to use viagra. BP remains elevated this am, but prior to am meds.  Will follow later today.  Could increase Hydralazine, but is not great candidate for imdur outpatient with viagra use.   Discussed importance of following up in the clinic for close management of his HF and adjustment of his medications.   Length of Stay: 1   Shirley Friar PA-C 09/20/2015, 8:17 AM  Advanced Heart Failure Team Pager [phone] (M-F; 7a - 4p)  Please contact [facility] for night-coverage after hours (4p -7a ) and weekends on [email or web address]  Patient seen and examined with Oda Kilts, PA-C. We discussed all aspects of the encounter. I agree with the assessment and plan as stated above.   Echo images from today reviewed personally. EF 20-25% range (previously 30-35%). Volume status improving with IV lasix. BP elevated. Agree with continuing IV lasix. Increase ACE-I. Stressed need for compliance with f/u and dietary recommendations.   Belvia Gotschall,MD 9:57 PM

## 2015-09-20 NOTE — Evaluation (Signed)
Physical Therapy Evaluation Patient Details Name: [name] MRN: [id number] DOB: [birth year] Today's Date: 09/20/2015   History of Present Illness  Pt is a 74 y.o. male that arrived c/o SOB and fatigue. PHMx includes moderate nonobstructive CAD, nonischemic cardiomyopathy with CHF, HTN, atrial arrythmias (now chronic AF) and obesity.  Clinical Impression  Pt was eager to get OOB but followed directions well during Tx. He presents with decreased functional balance/stability, moderate edema in bilat lower legs, diminished awareness of safety. Pt and wife were educated on use of RW for energy maintenance and safety, transferring only with staff, and POC. He would benefit from acute PT to improve safety with transfers, balance, and LE strength to decrease burden of care. Due to current level of safety awareness, inaccessible home environment, and instability during functional mobility, D/C to SNF is most appropriate.     Follow Up Recommendations SNF, however, pt refusing. Currently pt required 24/7 assist and HHPT if were to D/C home.     Equipment Recommendations  None recommended by PT    Recommendations for Other Services       Precautions / Restrictions Precautions Precautions: Fall Restrictions Weight Bearing Restrictions: No      Mobility  Bed Mobility Overal bed mobility: Modified Independent             General bed mobility comments: HOB elevated, use of rails to pivot and elevated trunk  Transfers Overall transfer level: Needs assistance Equipment used: Rolling walker (2 wheeled) Transfers: Sit to/from Stand Sit to Stand: Mod assist         General transfer comment: Mod assist to power up. 2 trials, pt was unable to stand with both hands pushing from bed on initial trial; pt had mod assist, one hand on walker and was successful on second trial. Mod VC to lean forward when standing  Ambulation/Gait Ambulation/Gait assistance: Min assist;+2  safety/equipment Ambulation Distance (Feet): 60 Feet Assistive device: Rolling walker (2 wheeled) Gait Pattern/deviations: Step-through pattern Gait velocity: decreased Gait velocity interpretation: Below normal speed for age/gender General Gait Details: pt with minimal foot clearance, dependende on UEs, minA for walker safety due pushing to far out in front. attempted to initially amb without AD and pt reaching frantically for something to hold onto. chair follow for safety.  Stairs            Wheelchair Mobility    Modified Rankin (Stroke Patients Only)       Balance Overall balance assessment: Needs assistance Sitting-balance support: Feet supported Sitting balance-Leahy Scale: Fair     Standing balance support: Bilateral upper extremity supported Standing balance-Leahy Scale: Poor Standing balance comment: Pt stood without walker and had to grab PT and dynamap for stability. Required RW during static and dynamic balance for stability.                              Pertinent Vitals/Pain Pain Assessment: No/denies pain    Home Living Family/patient expects to be discharged to:: Private residence Living Arrangements: Spouse/significant other Available Help at Discharge: Family;Friend(s) Type of Home: House Home Access: Stairs to enter   [employer] of Steps: 1 Home Layout: Two level Home Equipment: [place] - 4 wheels;Bedside commode      Prior Function Level of Independence: Independent with assistive device(s)         Comments: Did not use RW around home but needed 4 wheel walker out of home  Hand Dominance   Dominant Hand: Right    Extremity/Trunk Assessment   Upper Extremity Assessment: Overall WFL for tasks assessed           Lower Extremity Assessment: Overall WFL for tasks assessed      Cervical / Trunk Assessment: Normal  Communication   Communication: No difficulties  Cognition Arousal/Alertness:  Awake/alert Behavior During Therapy: WFL for tasks assessed/performed Overall Cognitive Status: Impaired/Different from baseline Area of Impairment: Safety/judgement         Safety/Judgement: Decreased awareness of safety;Decreased awareness of deficits          General Comments General comments (skin integrity, edema, etc.): +3 pitting edema in bilat LEs    Exercises        Assessment/Plan    PT Assessment Patient needs continued PT services  PT Diagnosis Difficulty walking   PT Problem List Decreased activity tolerance;Decreased balance;Decreased mobility;Decreased safety awareness;Decreased knowledge of use of DME;Cardiopulmonary status limiting activity  PT Treatment Interventions DME instruction;Gait training;Stair training;Functional mobility training;Therapeutic activities;Therapeutic exercise;Balance training;Patient/family education   PT Goals (Current goals can be found in the Care Plan section) Acute Rehab PT Goals Patient Stated Goal: Return home PT Goal Formulation: With patient/family Time For Goal Achievement: 09/27/15 Potential to Achieve Goals: Good    Frequency Min 3X/week   Barriers to discharge Inaccessible home environment Pt wishes to navigate stairs at home but reported that he has had difficulties and needs assistance from grandson PTA    Co-evaluation               End of Session Equipment Utilized During Treatment: Gait belt;Oxygen Activity Tolerance: Patient tolerated treatment well Patient left: in chair;with call bell/phone within reach;with family/visitor present Nurse Communication: Mobility status         Time: 1331-1405 PT Time Calculation (min) (ACUTE ONLY): 34 min   Charges:   PT Evaluation $Initial PT Evaluation Tier I: 1 Procedure PT Treatments $Gait Training: 8-22 mins   PT G CodesHaynes Bast 09-25-2015, 3:22 PM Haynes Bast, SPT 09/25/15 3:22 PM

## 2015-09-20 NOTE — Progress Notes (Signed)
Blood pressures remain elevated throughout the day.  Will increase benazepril to 20 mg tomorrow am.   [name]" [place], [place] 09/20/2015 4:08 PM

## 2015-09-21 LAB — PROTIME-INR
INR: 2.9 — ABNORMAL HIGH (ref 0.00–1.49)
PROTHROMBIN TIME: 29.8 s — AB (ref 11.6–15.2)

## 2015-09-21 LAB — BASIC METABOLIC PANEL
ANION GAP: 7 (ref 5–15)
BUN: 16 mg/dL (ref 6–20)
CALCIUM: 8.3 mg/dL — AB (ref 8.9–10.3)
CO2: 33 mmol/L — ABNORMAL HIGH (ref 22–32)
Chloride: 101 mmol/L (ref 101–111)
Creatinine, Ser: 1.27 mg/dL — ABNORMAL HIGH (ref 0.61–1.24)
GFR, EST NON AFRICAN AMERICAN: 54 mL/min — AB (ref >60)
Glucose, Bld: 82 mg/dL (ref 65–99)
POTASSIUM: 3.7 mmol/L (ref 3.5–5.1)
Sodium: 141 mmol/L (ref 135–145)

## 2015-09-21 LAB — GLUCOSE, CAPILLARY
GLUCOSE-CAPILLARY: 116 mg/dL — AB (ref 65–99)
GLUCOSE-CAPILLARY: 128 mg/dL — AB (ref 65–99)
GLUCOSE-CAPILLARY: 166 mg/dL — AB (ref 65–99)
GLUCOSE-CAPILLARY: 128 mg/dL — AB (ref 65–99)
Glucose-Capillary: 68 mg/dL (ref 65–99)

## 2015-09-21 MED ORDER — WARFARIN SODIUM 5 MG PO TABS
5.0000 mg | ORAL_TABLET | Freq: Once | ORAL | Status: AC
  Administered 2015-09-21: 5 mg via ORAL
  Filled 2015-09-21: qty 1

## 2015-09-21 MED ORDER — METOLAZONE 5 MG PO TABS
5.0000 mg | ORAL_TABLET | Freq: Once | ORAL | Status: AC
  Administered 2015-09-21: 5 mg via ORAL
  Filled 2015-09-21: qty 1

## 2015-09-21 NOTE — Progress Notes (Signed)
Inpatient Diabetes Program Recommendations  AACE/ADA: New Consensus Statement on Inpatient Glycemic Control (2015)  Target Ranges:  Prepandial:   less than 140 mg/dL      Peak postprandial:   less than 180 mg/dL (1-2 hours)      Critically ill patients:  140 - 180 mg/dL   Review of Glycemic Control  Results for MONTEY, FINUCANE (MRN [id number]) as of 09/21/2015 10:08  Ref. Range 09/20/2015 16:39 09/20/2015 17:56 09/20/2015 21:21 09/21/2015 06:28 09/21/2015 07:35  Glucose-Capillary Latest Ref Range: 65-99 mg/dL 74 84 81 68 116 (H)    Diabetes history: DM2 Outpatient Diabetes medications: NPH 20 units QAM before breakfast, NPH 10 units QPM before supper, NPH 5 units QHS, Acarbose 50 mg TID Current orders for Inpatient glycemic control: NPH 20 units QAM with breakfast, NPH 10 units QPM with supper, NPH 3-5 units QHS, Acarbose 50 mg TID  Inpatient Diabetes Program Recommendations: blood sugars very low- Please consider decreasing NPH while patient is here- consider NPH 15 units QAM with breakfast, NPH 7 units QPM with supper, NPH 3 units QHS  Gentry Fitz, RN, [place], , CDE Diabetes Coordinator Inpatient Diabetes Program  [phone] (Team Pager) [phone] ([place]) 09/21/2015 10:15 AM

## 2015-09-21 NOTE — Progress Notes (Signed)
Heart Failure Navigator Consult Note  Presentation: [name]  is a 74 y.o. male with a h/o moderate nonobstructive CAD, nonischemic cardiomyopathy with CHF, HTN, atrial arrythmias (now chronic AF) and obesity. He initially had an EF of 20-25% and single chamber ICD was implanted. EF then recovered to 50% (echo 2008) and had ICD explanted and BiV pacer placed due to bradycardia. Had routine follow-up echo in 08/09/10 and showed EF back down to 25-30% which worsened again on Echo 12/06/10 EF 20-25% despite increase in HF medications. So patient underwent R&L cath on 12/21/10, full results as below. Most recent Echo 07/2014 EF 30-35%.    He called the HF clinic the morning of 09/19/15 with c/o SOB, fatigue, and edema stating his weight had slowly climbed from 260 lbs to 290. We were unable to work him in acutely so recommended he come to the ER for further evaluation. Pertinent ED labs include K 3.9, Cr 1.33, BNP 705.6. Initial troponin 0.17, will follow. CXR showed no edema or consolidation. Areas of mild atelactasis on the left with no change in cardiac silhouette compared to CXR 12/21/10.  He states he has been compliant with his medications, but symptoms have been worsening for several weeks. Has gotten to the point where he is SOB with only a few steps. Can get to the bathroom in his house from his chair, but SOB walking back to chair. Takes all medications as directed. Pees well with lasix. Says fluid intake close to 50 oz a day. Watches salt closely. Has been managing diabetes so well has recently had decrease in his insulin. Denies Orthopnea, or SOB at rest. Has had no CP, lightheadedness, or dizziness. Says this happens "every year" and needs to come in for diuresis. Doesn't follow up when he feels well. He is unsure what brought this fluid overload on.     Past Medical History  Diagnosis Date  . Obesities, morbid ([place])   . Hypertension   . Hyperlipidemia   . Diabetes mellitus    . Polycythemia     vera requiring phlebotomy and treatment with hydroxyurea on a chronic bais. The patient followed by the [facility] for this.  . CHF (congestive heart failure) ([place])     secondary to nonischemic cardiomyopathy. EF initially 20-25% up to 50% in 2008. Echo 2/12 EF 20-25% . RHC 3/12: RA 21  RV 81/16/24  PA 81/36 (56) PCWP 33 (v to 43) PA sat 43% 48%.status post Medtronic biventricular ICD placement.(with Sprint Fidelis leads)-explanted--now with [employer] BivPM   . 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 Atrial fibrillation (HCC)     Chronic. On coumadin.  . Coronary artery disease, non-occlusive     Cath 3/12: mLAD 70%, m-dLAD 40%; Dx 60%; mCFX 30-40%; pRCA 30%)    Social History   Social History  . Marital Status: Married    Spouse Name: N/A  . Number of Children: N/A  . Years of Education: N/A   Social History Main Topics  . Smoking status: Former Research scientist (life sciences)  . Smokeless tobacco: None  . Alcohol Use: No  . Drug Use: No  . Sexual Activity: Not Asked   Other Topics Concern  . None   Social History Narrative   Retired   Married    Tobacco Use - No   Alcohol Use - No    ECHO:Study Conclusions--09/20/15  - Left ventricle: The cavity size was normal. There was mild concentric hypertrophy. Systolic function was  severely reduced. The estimated ejection fraction was in the range of 20% to 25%. Diffuse hypokinesis. Doppler parameters are consistent with a reversible restrictive pattern, indicative of decreased left ventricular diastolic compliance and/or increased left atrial pressure (grade 3 diastolic dysfunction). - Aortic valve: There was mild regurgitation. - Mitral valve: There was mild regurgitation. - Left atrium: The atrium was mildly dilated. - Right ventricle: The cavity size was mildly dilated. Wall thickness was normal. Pacer wire or catheter noted in right ventricle. Systolic function was moderately  reduced. - Right atrium: The atrium was mildly dilated. - Pulmonary arteries: Systolic pressure was moderately increased. PA peak pressure: 58 mm Hg (S).  Impressions:  - When compared to prior echocardiogram, EF is reduced.  ------------------------------------------------------------------- Labs, prior tests, procedures, and surgery: ICD- DiV Pacer.     Transthoracic echocardiography. M-mode, complete 2D, spectral Doppler, and color Doppler. Birthdate: Patient birthdate: 07-23-1941. Age: Patient is 74 yr old. Sex: Gender: male.  BMI: 35.2 kg/m^2. Blood pressure:   142/89 Patient status: Inpatient. Study date: Study date: 09/20/2015. Study time: 03:47 PM. Location: Bedside.  BNP    Component Value Date/Time   BNP 705.6* 09/19/2015 1245    ProBNP    Component Value Date/Time   PROBNP 139.9* 01/03/2011 1259     Education Assessment and Provision:  Detailed education and instructions provided on heart failure disease management including the following:  Signs and symptoms of Heart Failure When to call the physician Importance of daily weights Low sodium diet Fluid restriction Medication management Anticipated future follow-up appointments  Patient education given on each of the above topics.  Patient acknowledges understanding and acceptance of all instructions.  I spoke with [name] and his wife regarding his HF.  He seemed to ramble a bit and was difficult to direct him back to the topic of HF recommendations for home.  He lacks insight into his condition.  He does not weigh daily --only when he "thinks he has fluid".  I reinforced the importance of daily weights and how they relate to the signs and symptoms of HF.  He does "not eat salt" and per his own report eats low sodium.  When questioned about certain high sodium foods --he stated that he eats "good hotdogs from [place]"-- and that they "don't have a lot of sodium".  I reinforced a low  sodium diet and attempted to review high sodium foods to avoid.   He denies any issue getting or taking prescribed medications.  He follows in the [facility] sporadically.  He will have a follow-up appt there.  Education Materials:  "Living Better With Heart Failure" Booklet, Daily Weight Tracker Tool    High Risk Criteria for Readmission and/or Poor Patient Outcomes:  (Recommend Follow-up with Advanced Heart Failure Clinic)--yes   EF <30%- 20-25%  2 or more admissions in 6 months- No  Difficult social situation- no  Demonstrates medication noncompliance- denies    Barriers of Care:  Knowledge and compliance  Discharge Planning:   Plans to discharge with his wife to home.  He would benefit from [facility] for ongoing education, compliance reinforcement and symptom recognition.

## 2015-09-21 NOTE — Progress Notes (Signed)
Advanced Heart Failure Rounding Note  Primary Cardiologist: Dr Haroldine Laws EP: Dr Lovena Le  Subjective:    Says breathing continues to feel better. Everything is "mellow" today.  Wants more lasix if he needs further diuresis.   Out 2.3L x 24 hrs. Weight down another 2 lbs. Creatinine stable.   Objective:   Weight Range: 280 lb 11.2 oz (127.325 kg) Body mass index is 37.04 kg/(m^2).   Vital Signs:   Temp:  [97.6 F (36.4 C)-98.6 F (37 C)] 98.6 F (37 C) (12/08 0616) Pulse Rate:  [69-81] 69 (12/08 0616) Resp:  [16-20] 18 (12/08 0616) BP: (122-142)/(73-89) 139/78 mmHg (12/08 0616) SpO2:  [100 %] 100 % (12/08 0616) Weight:  [280 lb 11.2 oz (127.325 kg)] 280 lb 11.2 oz (127.325 kg) (12/08 0616) Last BM Date: 09/19/15  Weight change: Filed Weights   09/19/15 1723 09/20/15 0359 09/21/15 0616  Weight: 291 lb 3.6 oz (132.1 kg) 282 lb 3.2 oz (128.005 kg) 280 lb 11.2 oz (127.325 kg)    Intake/Output:   Intake/Output Summary (Last 24 hours) at 09/21/15 0914 Last data filed at 09/21/15 0905  Gross per 24 hour  Intake    938 ml  Output   2445 ml  Net  -1507 ml     Physical Exam:  General: Pleasant, NAD. Psych: Normal affect. HEENT: Normal, without mass or lesion. Neck: Supple, no bruits. JVD to 10-11  Carotids 2+. No thyromegaly or nodule noted. Heart: PMI laterally displaced. RRR no s3, s4, or murmurs appreciated Lungs: Clear, normal effort Abdomen: obese Soft, NT, ND, No HSM, BS + x 4.  Extremities: No clubbing, cyanosis. 1-2+ edema to thighs bilaterally. Neuro: Alert and oriented X 3. Moves all extremities spontaneously. Normal pedal pulses  Telemetry: Reviewed personally, a sensed v paced 70s, a fib underlying rhythm. PVCs  Labs: CBC  Recent Labs  09/19/15 1245  WBC 7.5  HGB 13.7  HCT 44.1  MCV 98.4  PLT 99991111   Basic Metabolic Panel  Recent Labs  09/20/15 0317 09/21/15 0413  NA 143 141  K 3.2* 3.7  CL 100* 101  CO2 33* 33*  GLUCOSE 68  82  BUN 15 16  CREATININE 1.28* 1.27*  CALCIUM 8.5* 8.3*    BNP: BNP (last 3 results)  Recent Labs  09/19/15 1245  BNP 705.6*    Other results:    Imaging/Studies:  Dg Chest 2 View  09/19/2015  CLINICAL DATA:  Shortness of breath for 1 day EXAM: CHEST  2 VIEW COMPARISON:  December 21, 2010 FINDINGS: There is mild scarring in the left mid lung and left base regions. There is no edema or consolidation. The heart is upper normal in size with pulmonary vascularity within normal limits. Pacemaker leads are attached to the right atrium, right ventricle, and left ventricle. No pneumothorax. No adenopathy. There is mild degenerative change in the thoracic spine. Stable mild elevation of the right hemidiaphragm persists. IMPRESSION: No edema or consolidation. Areas of mild atelectasis on the left. No change in cardiac silhouette. Electronically Signed   By: Lowella Grip III M.D.   On: 09/19/2015 13:37    Latest Echo  Latest Cath   Medications:     Scheduled Medications: . acarbose  50 mg Oral TID WC  . allopurinol  300 mg Oral Daily  . aspirin  81 mg Oral Daily  . benazepril  20 mg Oral Daily  . carvedilol  25 mg Oral BID WC  . digoxin  125 mcg Oral  Daily  . ferrous sulfate  325 mg Oral Q breakfast  . furosemide  80 mg Intravenous BID  . hydrALAZINE  50 mg Oral TID  . hydroxyurea  500 mg Oral Daily  . insulin NPH Human  10 Units Subcutaneous QAC supper  . insulin NPH Human  20 Units Subcutaneous QAC breakfast  . insulin NPH Human  3-5 Units Subcutaneous QHS  . potassium chloride SA  40 mEq Oral Daily  . simvastatin  20 mg Oral QHS  . sodium chloride  3 mL Intravenous Q12H  . spironolactone  12.5 mg Oral Daily  . terazosin  2 mg Oral QHS  . warfarin  5 mg Oral ONCE-1800  . Warfarin - Pharmacist Dosing Inpatient   Does not apply q1800    Infusions:    PRN Medications: sodium chloride, acetaminophen, ondansetron (ZOFRAN) IV, sodium chloride   Assessment/Plan   1.  Acute on chronic systolic CHF: Nonischemic cardiomyopathy. EF 20-25% ECHO 09/20/15. S/p BIV pacer 2. Non-obstructive CAD 3. Atrial fibrillation: Chronic.with tachy-brady s/p CRT-P 4. Morbid obesity 5. HTN  Repeat echo shows EF decreased to 20-25%. (Down from 30-35% in 07/2014)  Coumadin per pharm. INR stable.  Remains volume overloaded. Will give 5 mg metolazone today and continue Lasix 80 mg IV BID. Unclear baseline weight. Creatinine stable.  Continue K supplementation.  BP improved today with diuresis and addition of imdur, but remains elevated.  Will increase benazepril as planned. D/c imdur with chronic viagra use.   Again stressed importance of compliance with his medications and HF appointments.   Length of Stay: 2   Shirley Friar PA-C 09/21/2015, 9:14 AM  Advanced Heart Failure Team Pager [phone] (M-F; 7a - 4p)  Please contact [facility] for night-coverage after hours (4p -7a ) and weekends on [email or web address]  Patient seen and examined with Oda Kilts, PA-C. We discussed all aspects of the encounter. I agree with the assessment and plan as stated above.   Volume status improving but still with fluid on board. Continue IV diuresis.   Ceci Taliaferro,MD 6:50 PM

## 2015-09-21 NOTE — Progress Notes (Signed)
ANTICOAGULATION CONSULT NOTE - Follow Up Consult  Pharmacy Consult for warfarin Indication: atrial fibrillation  No Known Allergies  Patient Measurements: Height: 6\' 1"  (185.4 cm) Weight: 280 lb 11.2 oz (127.325 kg) IBW/kg (Calculated) : 79.9  Vital Signs: Temp: 98.6 F (37 C) (12/08 0616) Temp Source: Oral (12/08 0616) BP: 139/78 mmHg (12/08 0616) Pulse Rate: 69 (12/08 0616)  Labs:  Recent Labs  09/19/15 1245 09/19/15 1435 09/20/15 0317 09/21/15 0413  HGB 13.7  --   --   --   HCT 44.1  --   --   --   PLT 238  --   --   --   LABPROT  --  30.0* 29.0* 29.8*  INR  --  2.92* 2.79* 2.90*  CREATININE 1.33*  --  1.28* 1.27*    Estimated Creatinine Clearance: 70.3 mL/min (by C-G formula based on Cr of 1.27).   Assessment: 74 y/o male who presented to the ED with SOB and fluid overload, admitted for CHF exacerbation. He takes chronic warfarin for Afib. INR is therapeutic at 2.9. No bleeding noted, CBC normal. Due to potential rise in INR above goal in setting of CHF exacerbation, will give slightly lower dose tonight.   PTA regimen: 6 mg daily  Goal of Therapy:  INR 2-3 Monitor platelets by anticoagulation protocol: Yes   Plan:  - Warfarin 5 mg PO tonight - INR daily - Monitor for s/sx of bleeding  Sloan Leiter, PharmD, BCPS Clinical Pharmacist [phone] 09/21/2015 8:11 AM

## 2015-09-21 NOTE — Progress Notes (Signed)
Physical Therapy Treatment Patient Details Name: [name] MRN: [id number] DOB: 1941-05-01 Today's Date: 09/21/2015    History of Present Illness Pt is a 74 y.o. male that arrived c/o SOB and fatigue. PHMx includes moderate nonobstructive CAD, nonischemic cardiomyopathy with CHF, HTN, atrial arrythmias (now chronic AF) and obesity.    PT Comments    Pt with poor safety awareness during functional mobility requiring overall min assist with RW due to impaired balance and impulsivity. Trial without O2 during mobility and initially O2 remained at 94% on room air after gait but upon return to room dropped to 85% and reapplied [place]. Cues for pursed lip breathing during mobility as well. Continue to recommend SNF for further rehab to progress safe functional mobility.     Follow Up Recommendations  SNF. Pt refusing and if goes home, recommend HHPT and 24/7 supervision.     Equipment Recommendations  None recommended by PT    Recommendations for Other Services       Precautions / Restrictions Precautions Precautions: Fall Restrictions Weight Bearing Restrictions: No    Mobility  Bed Mobility Overal bed mobility: Needs Assistance Bed Mobility: Sit to Supine       Sit to supine: Mod assist   General bed mobility comments: Required assist to get BLE onto the bed  Transfers Overall transfer level: Needs assistance Equipment used: Rolling walker (2 wheeled) Transfers: Sit to/from [facility] Sit to Stand: Min assist Stand pivot transfers: Min assist       General transfer comment: verbal cues for safe technique and hand placement. Pt impulsive during session  Ambulation/Gait Ambulation/Gait assistance: Min assist Ambulation Distance (Feet): 65 Feet Assistive device: Rolling walker (2 wheeled) Gait Pattern/deviations: Trunk flexed     General Gait Details: verbal cues for upright posture and to increase heel strike. Pt with LOB requring min assist to  recover. Poor safety awareness overall.   Stairs            Wheelchair Mobility    Modified Rankin (Stroke Patients Only)       Balance Overall balance assessment: Needs assistance         Standing balance support: Single extremity supported                        Cognition Arousal/Alertness: Awake/alert Behavior During Therapy: Impulsive Overall Cognitive Status: Impaired/Different from baseline Area of Impairment: Safety/judgement         Safety/Judgement: Decreased awareness of safety;Decreased awareness of deficits     General Comments: Pt requires min to mod verbal cues for safety with AD as well as to wait until therapist ready to assist patient with moving.     Exercises      General Comments        Pertinent Vitals/Pain Pain Assessment: No/denies pain    Home Living                      Prior Function            PT Goals (current goals can now be found in the care plan section) Acute Rehab PT Goals Patient Stated Goal: Return home PT Goal Formulation: With patient/family Time For Goal Achievement: 09/27/15 Potential to Achieve Goals: Good Progress towards PT goals: Progressing toward goals    Frequency  Min 3X/week    PT Plan Current plan remains appropriate    Co-evaluation  End of Session Equipment Utilized During Treatment: Gait belt;Oxygen Activity Tolerance: Patient tolerated treatment well Patient left: in bed;with call bell/phone within reach;with bed alarm set;with family/visitor present     Time: [id number] PT Time Calculation (min) (ACUTE ONLY): 14 min  Charges:  $Therapeutic Activity: 8-22 mins                    G Codes:      Canary Brim Ivory Broad, PT, DPT Pager #: [phone]  09/21/2015, 1:57 PM

## 2015-09-21 NOTE — Discharge Instructions (Addendum)
Information on my medicine - Coumadin®   (Warfarin) ° °This medication education was reviewed with me or my healthcare representative as part of my discharge preparation.  The pharmacist that spoke with me during my hospital stay was:  Millen, Jessica Brown, RPH ° °Why was Coumadin prescribed for you? °Coumadin was prescribed for you because you have a blood clot or a medical condition that can cause an increased risk of forming blood clots. Blood clots can cause serious health problems by blocking the flow of blood to the heart, lung, or brain. Coumadin can prevent harmful blood clots from forming. °As a reminder your indication for Coumadin is:   Stroke Prevention Because Of Atrial Fibrillation ° °What test will check on my response to Coumadin? °While on Coumadin (warfarin) you will need to have an INR test regularly to ensure that your dose is keeping you in the desired range. The INR (international normalized ratio) number is calculated from the result of the laboratory test called prothrombin time (PT). ° °If an INR APPOINTMENT HAS NOT ALREADY BEEN MADE FOR YOU please schedule an appointment to have this lab work done by your health care provider within 7 days. °Your INR goal is usually a number between:  2 to 3 or your provider may give you a more narrow range like 2-2.5.  Ask your health care provider during an office visit what your goal INR is. ° °What  do you need to  know  About  COUMADIN? °Take Coumadin (warfarin) exactly as prescribed by your healthcare provider about the same time each day.  DO NOT stop taking without talking to the doctor who prescribed the medication.  Stopping without other blood clot prevention medication to take the place of Coumadin may increase your risk of developing a new clot or stroke.  Get refills before you run out. ° °What do you do if you miss a dose? °If you miss a dose, take it as soon as you remember on the same day then continue your regularly scheduled regimen the  next day.  Do not take two doses of Coumadin at the same time. ° °Important Safety Information °A possible side effect of Coumadin (Warfarin) is an increased risk of bleeding. You should call your healthcare provider right away if you experience any of the following: °? Bleeding from an injury or your nose that does not stop. °? Unusual colored urine (red or dark brown) or unusual colored stools (red or black). °? Unusual bruising for unknown reasons. °? A serious fall or if you hit your head (even if there is no bleeding). ° °Some foods or medicines interact with Coumadin® (warfarin) and might alter your response to warfarin. To help avoid this: °? Eat a balanced diet, maintaining a consistent amount of Vitamin K. °? Notify your provider about major diet changes you plan to make. °? Avoid alcohol or limit your intake to 1 drink for women and 2 drinks for men per day. °(1 drink is 5 oz. wine, 12 oz. beer, or 1.5 oz. liquor.) ° °Make sure that ANY health care provider who prescribes medication for you knows that you are taking Coumadin (warfarin).  Also make sure the healthcare provider who is monitoring your Coumadin knows when you have started a new medication including herbals and non-prescription products. ° °Coumadin® (Warfarin)  Major Drug Interactions  °Increased Warfarin Effect Decreased Warfarin Effect  °Alcohol (large quantities) °Antibiotics (esp. Septra/Bactrim, Flagyl, Cipro) °Amiodarone (Cordarone) °Aspirin (ASA) °Cimetidine (Tagamet) °Megestrol (Megace) °NSAIDs (ibuprofen,   naproxen, etc.) Piroxicam (Feldene) Propafenone (Rythmol SR) Propranolol (Inderal) Isoniazid (INH) Posaconazole (Noxafil) Barbiturates (Phenobarbital) Carbamazepine (Tegretol) Chlordiazepoxide (Librium) Cholestyramine (Questran) Griseofulvin Oral Contraceptives Rifampin Sucralfate (Carafate) Vitamin K   Coumadin (Warfarin) Major Herbal Interactions  Increased Warfarin Effect Decreased Warfarin Effect   Garlic Ginseng Ginkgo biloba Coenzyme Q10 Green tea [place]. Johns wort    Coumadin (Warfarin) FOOD Interactions  Eat a consistent number of servings per week of foods HIGH in Vitamin K (1 serving =  cup)  Collards (cooked, or boiled & drained) Kale (cooked, or boiled & drained) Mustard greens (cooked, or boiled & drained) Parsley *serving size only =  cup Spinach (cooked, or boiled & drained) Swiss chard (cooked, or boiled & drained) Turnip greens (cooked, or boiled & drained)  Eat a consistent number of servings per week of foods MEDIUM-HIGH in Vitamin K (1 serving = 1 cup)  Asparagus (cooked, or boiled & drained) Broccoli (cooked, boiled & drained, or raw & chopped) Brussel sprouts (cooked, or boiled & drained) *serving size only =  cup Lettuce, raw (green leaf, endive, romaine) Spinach, raw Turnip greens, raw & chopped   These websites have more information on Coumadin (warfarin):  FailFactory.se; VeganReport.com.au;  Weigh daily Call HF clinic if weight climbs more than 3 pounds in a day or 5 pounds in a week. No salt to very little salt in your diet.  No more than 2000 mg in a day. Call if increased shortness of breath or increased swelling.  Low salt diabetic diet

## 2015-09-21 NOTE — Progress Notes (Signed)
Utilization review completed. Renalda Locklin, RN, BSN. 

## 2015-09-22 DIAGNOSIS — I472 Ventricular tachycardia: Secondary | ICD-10-CM

## 2015-09-22 LAB — GLUCOSE, CAPILLARY
Glucose-Capillary: 103 mg/dL — ABNORMAL HIGH (ref 65–99)
Glucose-Capillary: 114 mg/dL — ABNORMAL HIGH (ref 65–99)
Glucose-Capillary: 111 mg/dL — ABNORMAL HIGH (ref 65–99)
Glucose-Capillary: 154 mg/dL — ABNORMAL HIGH (ref 65–99)

## 2015-09-22 LAB — BASIC METABOLIC PANEL
ANION GAP: 9 (ref 5–15)
BUN: 18 mg/dL (ref 6–20)
CHLORIDE: 95 mmol/L — AB (ref 101–111)
CO2: 35 mmol/L — ABNORMAL HIGH (ref 22–32)
Calcium: 8.5 mg/dL — ABNORMAL LOW (ref 8.9–10.3)
Creatinine, Ser: 1.35 mg/dL — ABNORMAL HIGH (ref 0.61–1.24)
GFR calc non Af Amer: 50 mL/min — ABNORMAL LOW (ref >60)
GFR, EST AFRICAN AMERICAN: 58 mL/min — AB (ref >60)
Glucose, Bld: 99 mg/dL (ref 65–99)
POTASSIUM: 3.4 mmol/L — AB (ref 3.5–5.1)
SODIUM: 139 mmol/L (ref 135–145)

## 2015-09-22 LAB — PROTIME-INR
INR: 2.66 — AB (ref 0.00–1.49)
PROTHROMBIN TIME: 28 s — AB (ref 11.6–15.2)

## 2015-09-22 LAB — MAGNESIUM: Magnesium: 2.1 mg/dL (ref 1.7–2.4)

## 2015-09-22 MED ORDER — METOLAZONE 2.5 MG PO TABS
2.5000 mg | ORAL_TABLET | Freq: Once | ORAL | Status: AC
  Administered 2015-09-22: 2.5 mg via ORAL
  Filled 2015-09-22: qty 1

## 2015-09-22 MED ORDER — POTASSIUM CHLORIDE CRYS ER 20 MEQ PO TBCR
20.0000 meq | EXTENDED_RELEASE_TABLET | Freq: Once | ORAL | Status: AC
  Administered 2015-09-22: 20 meq via ORAL
  Filled 2015-09-22: qty 1

## 2015-09-22 MED ORDER — POTASSIUM CHLORIDE CRYS ER 20 MEQ PO TBCR: 40.0000 meq | EXTENDED_RELEASE_TABLET | Freq: Once | ORAL | Status: DC

## 2015-09-22 MED ORDER — FUROSEMIDE 10 MG/ML IJ SOLN: 80.0000 mg | Freq: Three times a day (TID) | INTRAMUSCULAR | Status: DC

## 2015-09-22 MED ORDER — POTASSIUM CHLORIDE CRYS ER 20 MEQ PO TBCR
40.0000 meq | EXTENDED_RELEASE_TABLET | Freq: Once | ORAL | Status: AC
  Administered 2015-09-22: 40 meq via ORAL
  Filled 2015-09-22: qty 2

## 2015-09-22 MED ORDER — WARFARIN SODIUM 5 MG PO TABS: 5.0000 mg | ORAL_TABLET | Freq: Once | ORAL | Status: DC

## 2015-09-22 MED ORDER — FUROSEMIDE 10 MG/ML IJ SOLN
80.0000 mg | Freq: Three times a day (TID) | INTRAMUSCULAR | Status: DC
  Administered 2015-09-22 – 2015-09-23 (×3): 80 mg via INTRAVENOUS
  Filled 2015-09-22 (×3): qty 8

## 2015-09-22 MED ORDER — WARFARIN SODIUM 3 MG PO TABS
6.0000 mg | ORAL_TABLET | Freq: Once | ORAL | Status: AC
  Administered 2015-09-22: 6 mg via ORAL
  Filled 2015-09-22: qty 2

## 2015-09-22 NOTE — Progress Notes (Signed)
Advanced Heart Failure Rounding Note  Primary Cardiologist: Dr Haroldine Laws EP: Dr Lovena Le  Subjective:    Had a lot of pain overnight with foley, but adjusted this morning and much improved. Ambulating without difficulty.  No SOB, CP, lightheadedness, or dizziness.   Out 3.6L x 24 hrs. Weight down 3 lbs. Creatinine relatively stable.   Objective:   Weight Range: 277 lb 14.4 oz (126.055 kg) Body mass index is 36.67 kg/(m^2).   Vital Signs:   Temp:  [97.6 F (36.4 C)-98.6 F (37 C)] 97.6 F (36.4 C) (12/09 0501) Pulse Rate:  [70-82] 78 (12/09 0501) Resp:  [18] 18 (12/09 0501) BP: (123-144)/(73-84) 144/84 mmHg (12/09 0501) SpO2:  [94 %-98 %] 96 % (12/09 0501) Weight:  [277 lb 14.4 oz (126.055 kg)] 277 lb 14.4 oz (126.055 kg) (12/09 0501) Last BM Date: 09/19/15  Weight change: Filed Weights   09/20/15 0359 09/21/15 0616 09/22/15 0501  Weight: 282 lb 3.2 oz (128.005 kg) 280 lb 11.2 oz (127.325 kg) 277 lb 14.4 oz (126.055 kg)    Intake/Output:   Intake/Output Summary (Last 24 hours) at 09/22/15 0813 Last data filed at 09/22/15 0600  Gross per 24 hour  Intake    940 ml  Output   4200 ml  Net  -3260 ml     Physical Exam:  General: Elderly appearing, no resp distress Psych: Normal affect. HEENT: Normocephalic, atraumatic. Neck: Supple, no bruits. JVD to 10-11  Carotids 2+. No thyromegaly or lymphadenopathy.  Heart: PMI laterally displaced. RRR no s3, s4, or murmurs noted. Lungs: CTAB, NAD Abdomen: obese Soft, NT, ND, No HSM, BS + x 4.  Extremities: No clubbing, cyanosis. 1+ edema to thighs bilaterally. Neuro: Alert and oriented X 3. Moves all extremities spontaneously. Normal pedal pulses  Telemetry: a sensed v paced 70s, a fib underlying rhythm. 7 beat run of NSVT, no symptoms.   Labs: CBC  Recent Labs  09/19/15 1245  WBC 7.5  HGB 13.7  HCT 44.1  MCV 98.4  PLT 811   Basic Metabolic Panel  Recent Labs  09/21/15 0413 09/22/15 0439  NA 141  139  K 3.7 3.4*  CL 101 95*  CO2 33* 35*  GLUCOSE 82 99  BUN 16 18  CREATININE 1.27* 1.35*  CALCIUM 8.3* 8.5*    BNP: BNP (last 3 results)  Recent Labs  09/19/15 1245  BNP 705.6*    Other results:    Imaging/Studies:  No results found.  Latest Echo  Latest Cath   Medications:     Scheduled Medications: . acarbose  50 mg Oral TID WC  . allopurinol  300 mg Oral Daily  . aspirin  81 mg Oral Daily  . benazepril  20 mg Oral Daily  . carvedilol  25 mg Oral BID WC  . digoxin  125 mcg Oral Daily  . ferrous sulfate  325 mg Oral Q breakfast  . furosemide  80 mg Intravenous BID  . hydrALAZINE  50 mg Oral TID  . hydroxyurea  500 mg Oral Daily  . insulin NPH Human  10 Units Subcutaneous QAC supper  . insulin NPH Human  20 Units Subcutaneous QAC breakfast  . insulin NPH Human  3-5 Units Subcutaneous QHS  . potassium chloride SA  40 mEq Oral Daily  . simvastatin  20 mg Oral QHS  . sodium chloride  3 mL Intravenous Q12H  . spironolactone  12.5 mg Oral Daily  . terazosin  2 mg Oral QHS  . Warfarin -  Pharmacist Dosing Inpatient   Does not apply q1800    Infusions:    PRN Medications: sodium chloride, acetaminophen, ondansetron (ZOFRAN) IV, sodium chloride   Assessment/Plan   1. Acute on chronic systolic CHF: Nonischemic cardiomyopathy. EF 20-25% ECHO 09/20/15. S/p BIV pacer 2. Non-obstructive CAD 3. Atrial fibrillation: Chronic.with tachy-brady s/p CRT-P 4. Morbid obesity 5. HTN  Repeat echo shows EF decreased to 20-25%. (Down from 30-35% in 07/2014)  INR stable. Warfarin dosing per pharm.   He still has volume on board.  Will increase IV lasix to 80 mg TID and give 2.5 metolazone. Unclear baseline weight. Creatinine relatively stable.   Had 7 beat run of asymptomatic NSVT. Supp K and check Mg. Will give extra 60 meq K today with increase in diuresis.  Will not increase ACE with increased diuresis and creatinine slightly higher than baseline. No imdur  with chronic viagra use.   Met with HF nurse navigator yesterday. Discussed med and dietary compliance as well as follow up.  Length of Stay: 3   Shirley Friar PA-C 09/22/2015, 8:13 AM  Advanced Heart Failure Team Pager [phone] (M-F; 7a - 4p)  Please contact [facility] for night-coverage after hours (4p -7a ) and weekends on [email or web address]   Patient seen and examined with Oda Kilts, PA-C. We discussed all aspects of the encounter. I agree with the assessment and plan as stated above.   Diuresing well with lasix but still volume overloaded. Agree with continuing IV lasix and metolazone at least one more day. Watch renal function closely as renal function up slightly. Hopefully can switch to po tomorrow and get him home by Sunday,  Tele reviewed personally with 7-beat NSVT. Supp electrolytes. He has CRT-P. (ICD previously explanted).    Continue warfarin for AF.   Laketra Bowdish,MD 4:05 PM

## 2015-09-22 NOTE — Care Management Important Message (Signed)
Important Message  Patient Details  Name: [name] MRN: [id number] Date of Birth: September 15, 1941   Medicare Important Message Given:  Yes    [name] 09/22/2015, 1:38 PM

## 2015-09-22 NOTE — Progress Notes (Signed)
Pt had a 7 beat run of V-tach, no s/s, MD notified, will continue to monitor, thanks Arvella Nigh RN

## 2015-09-22 NOTE — Progress Notes (Signed)
ANTICOAGULATION CONSULT NOTE - Follow Up Consult  Pharmacy Consult for warfarin Indication: atrial fibrillation  No Known Allergies  Patient Measurements: Height: 6\' 1"  (185.4 cm) Weight: 277 lb 14.4 oz (126.055 kg) (scale b) IBW/kg (Calculated) : 79.9  Vital Signs: Temp: 98.2 F (36.8 C) (12/09 1221) Temp Source: Oral (12/09 1221) BP: 128/91 mmHg (12/09 1221) Pulse Rate: 70 (12/09 1221)  Labs:  Recent Labs  09/20/15 0317 09/21/15 0413 09/22/15 0439  LABPROT 29.0* 29.8* 28.0*  INR 2.79* 2.90* 2.66*  CREATININE 1.28* 1.27* 1.35*    Estimated Creatinine Clearance: 65.8 mL/min (by C-G formula based on Cr of 1.35).   Assessment: 74 y/o male who presented to the ED with SOB and fluid overload, admitted for CHF exacerbation. He takes chronic warfarin for Afib. INR is therapeutic at 2.66. No bleeding noted, CBC wnl.  PTA regimen: 6 mg daily  Goal of Therapy:  INR 2-3 Monitor platelets by anticoagulation protocol: Yes   Plan:  - Warfarin 6 mg PO tonight - INR daily - Monitor for s/sx of bleeding   Melburn Popper, PharmD Clinical Pharmacy Resident Pager: [phone] 09/22/2015 2:19 PM

## 2015-09-23 DIAGNOSIS — I5023 Acute on chronic systolic (congestive) heart failure: Secondary | ICD-10-CM

## 2015-09-23 LAB — GLUCOSE, CAPILLARY
GLUCOSE-CAPILLARY: 75 mg/dL (ref 65–99)
GLUCOSE-CAPILLARY: 159 mg/dL — AB (ref 65–99)
GLUCOSE-CAPILLARY: 152 mg/dL — AB (ref 65–99)
Glucose-Capillary: 180 mg/dL — ABNORMAL HIGH (ref 65–99)

## 2015-09-23 LAB — BASIC METABOLIC PANEL
ANION GAP: 10 (ref 5–15)
BUN: 21 mg/dL — ABNORMAL HIGH (ref 6–20)
CHLORIDE: 94 mmol/L — AB (ref 101–111)
CO2: 35 mmol/L — ABNORMAL HIGH (ref 22–32)
Calcium: 8.9 mg/dL (ref 8.9–10.3)
Creatinine, Ser: 1.19 mg/dL (ref 0.61–1.24)
GFR calc Af Amer: >60 mL/min (ref >60)
GFR, EST NON AFRICAN AMERICAN: 58 mL/min — AB (ref >60)
GLUCOSE: 69 mg/dL (ref 65–99)
POTASSIUM: 4 mmol/L (ref 3.5–5.1)
Sodium: 139 mmol/L (ref 135–145)

## 2015-09-23 LAB — PROTIME-INR
INR: 2.63 — ABNORMAL HIGH (ref 0.00–1.49)
PROTHROMBIN TIME: 27.7 s — AB (ref 11.6–15.2)

## 2015-09-23 MED ORDER — SPIRONOLACTONE 25 MG PO TABS
25.0000 mg | ORAL_TABLET | Freq: Every day | ORAL | Status: DC
  Administered 2015-09-24: 25 mg via ORAL
  Filled 2015-09-23: qty 1

## 2015-09-23 MED ORDER — FUROSEMIDE 80 MG PO TABS
80.0000 mg | ORAL_TABLET | Freq: Every day | ORAL | Status: DC
  Administered 2015-09-24: 80 mg via ORAL
  Filled 2015-09-23: qty 1

## 2015-09-23 MED ORDER — FUROSEMIDE 40 MG PO TABS
40.0000 mg | ORAL_TABLET | Freq: Every evening | ORAL | Status: DC
  Administered 2015-09-23: 40 mg via ORAL
  Filled 2015-09-23: qty 1

## 2015-09-23 MED ORDER — WARFARIN SODIUM 3 MG PO TABS
6.0000 mg | ORAL_TABLET | Freq: Once | ORAL | Status: AC
  Administered 2015-09-23: 6 mg via ORAL
  Filled 2015-09-23: qty 2

## 2015-09-23 NOTE — Progress Notes (Signed)
Patient ID: [name], male   DOB: 07-11-1941, 74 y.o.   MRN: [id number]    Advanced Heart Failure Rounding Note  Primary Cardiologist: Dr Haroldine Laws EP: Dr Lovena Le  Subjective:    Doing well, still with pain from foley.  Diuresed a lot and weight down 8 lbs.  No dyspnea.    Objective:   Weight Range: 269 lb 8 oz (122.244 kg) Body mass index is 35.56 kg/(m^2).   Vital Signs:   Temp:  [98.1 F (36.7 C)-98.2 F (36.8 C)] 98.1 F (36.7 C) (12/10 0616) Pulse Rate:  [70-75] 75 (12/10 0616) Resp:  [16-18] 18 (12/10 0616) BP: (123-137)/(74-87) 123/74 mmHg (12/10 0616) SpO2:  [97 %-98 %] 97 % (12/10 0616) Weight:  [269 lb 8 oz (122.244 kg)] 269 lb 8 oz (122.244 kg) (12/10 0616) Last BM Date: 09/19/15  Weight change: Filed Weights   09/21/15 0616 09/22/15 0501 09/23/15 0616  Weight: 280 lb 11.2 oz (127.325 kg) 277 lb 14.4 oz (126.055 kg) 269 lb 8 oz (122.244 kg)    Intake/Output:   Intake/Output Summary (Last 24 hours) at 09/23/15 1251 Last data filed at 09/23/15 1110  Gross per 24 hour  Intake    820 ml  Output   5500 ml  Net  -4680 ml     Physical Exam:  General: Elderly appearing, no resp distress Psych: Normal affect. HEENT: Normocephalic, atraumatic. Neck: Supple, no bruits. JVD 7-8 cm  Carotids 2+. No thyromegaly or lymphadenopathy.  Heart: PMI laterally displaced. RRR no s3, s4, or murmurs noted. Lungs: CTAB, NAD Abdomen: obese Soft, NT, ND, No HSM, BS + x 4.  Extremities: No clubbing, cyanosis. Trace ankle edema.  Neuro: Alert and oriented X 3. Moves all extremities spontaneously. Normal pedal pulses  Telemetry: atrial fibrillation with v-pacing   Labs: CBC No results for input(s): WBC, NEUTROABS, HGB, HCT, MCV, PLT in the last 72 hours. Basic Metabolic Panel  Recent Labs  09/22/15 0439 09/23/15 0447  NA 139 139  K 3.4* 4.0  CL 95* 94*  CO2 35* 35*  GLUCOSE 99 69  BUN 18 21*  CREATININE 1.35* 1.19  CALCIUM 8.5* 8.9  MG 2.1  --      BNP: BNP (last 3 results)  Recent Labs  09/19/15 1245  BNP 705.6*    Other results:    Imaging/Studies:  No results found.  Latest Echo  Latest Cath   Medications:     Scheduled Medications: . acarbose  50 mg Oral TID WC  . allopurinol  300 mg Oral Daily  . aspirin  81 mg Oral Daily  . benazepril  20 mg Oral Daily  . carvedilol  25 mg Oral BID WC  . digoxin  125 mcg Oral Daily  . ferrous sulfate  325 mg Oral Q breakfast  . furosemide  40 mg Oral QPM  . [START ON 09/24/2015] furosemide  80 mg Oral Q breakfast  . hydrALAZINE  50 mg Oral TID  . hydroxyurea  500 mg Oral Daily  . insulin NPH Human  10 Units Subcutaneous QAC supper  . insulin NPH Human  20 Units Subcutaneous QAC breakfast  . insulin NPH Human  3-5 Units Subcutaneous QHS  . potassium chloride SA  40 mEq Oral Daily  . simvastatin  20 mg Oral QHS  . sodium chloride  3 mL Intravenous Q12H  . [START ON 09/24/2015] spironolactone  25 mg Oral Daily  . terazosin  2 mg Oral QHS  . Warfarin - Pharmacist  Dosing Inpatient   Does not apply q1800    Infusions:    PRN Medications: sodium chloride, acetaminophen, ondansetron (ZOFRAN) IV, sodium chloride   Assessment/Plan  1. Acute on chronic systolic CHF: Nonischemic cardiomyopathy. EF 30-35% on last echo in 10/15, EF 20-25% this admission. Sounds like gradual onset of symptoms, was volume overloaded when he saw Dr Lovena Le last month. No acute trigger. He was admitted markedly volume overloaded, weight up a lot from baseline. He has Research officer, political party CRT-P device. He has diuresed well, now looks near-euvolemic.  - Stop IV Lasix, will convert to Lasix 80 qam/40 qpm for home.  - Continue Coreg, benazepril and hydralazine at current dosing.  Would hold off on Imdur given ongoing Viagra use.  - Increase spironolactone to 25 mg daily. 2. CAD: Moderate nonobstructive CAD in past. Mild troponin elevation, no chest pain. Suspect demand ischemia in the setting of  volume overload. Continue statin, does not need aspirin given warfarin use and stable CAD. 3. Atrial fibrillation: Chronic. Continue warfarin. INR therapeutic.  4. Dispo: Hopefully home tomorrow.  Loralie Champagne 09/23/2015 12:55 PM

## 2015-09-23 NOTE — Care Management Note (Addendum)
Case Management Note  Patient Details  Name: [name] MRN: [id number] Date of Birth: 02/01/1941  Subjective/Objective:  74 yo M with SOB and fatigue. PMH includes moderate nonobstructive CAD, nonischemic cardiomyopathy with CHF, HTN, and atrial arrythmias.          Action/Plan: received referral for [facility] screen for heart failure. PT is recommending SNF, but pt is refusing to go to SNF - PT recommended  HHPT, 24 hr supervision if he goes home  Expected Discharge Date:  09/23/15               Expected Discharge Plan:  [place]  In-House Referral:     Discharge planning Services  CM Consult  Post Acute Care Choice:  Home Health Choice offered to:  Patient, Spouse  DME Arranged:    DME Agency:     HH Arranged:  RN, PT [place] Agency:  [place]  Status of Service:  In process, will continue to follow  Medicare Important Message Given:  Yes Date Medicare IM Given:    Medicare IM give by:    Date Additional Medicare IM Given:    Additional Medicare Important Message give by:     If discussed at [place] of Stay Meetings, dates discussed:    Additional Comments: met with pt and wife. Discussed PT recommendations. Pt stated that he is not goind to a rehab facility. He is going home with the support of his wife, granddaughter who is a Marine scientist, and his other children. Wife reports that they have 5 kids. He has a RW, cane, and 3-n-1 BSC. He agreed with [facility]. Provided pt with a list of [place] agencies. He wants to use [facility]. Contacted Tiffany at [facility] for referral.  Norina Buzzard, RN 09/23/2015, 2:59 PM

## 2015-09-23 NOTE — Progress Notes (Signed)
ANTICOAGULATION CONSULT NOTE - Follow Up Consult  Pharmacy Consult for warfarin Indication: atrial fibrillation  No Known Allergies  Patient Measurements: Height: 6\' 1"  (185.4 cm) Weight: 269 lb 8 oz (122.244 kg) (scale b) IBW/kg (Calculated) : 79.9  Vital Signs: Temp: 97.8 F (36.6 C) (12/10 1312) Temp Source: Oral (12/10 1312) BP: 127/78 mmHg (12/10 1312) Pulse Rate: 76 (12/10 1312)  Labs:  Recent Labs  09/21/15 0413 09/22/15 0439 09/23/15 0447  LABPROT 29.8* 28.0* 27.7*  INR 2.90* 2.66* 2.63*  CREATININE 1.27* 1.35* 1.19    Estimated Creatinine Clearance: 73.4 mL/min (by C-G formula based on Cr of 1.19).   Assessment: 74 y/o male who presented to the ED with SOB and fluid overload, admitted for CHF exacerbation. To continue Coumadin for Afib (6mg  daily PTA, last dose 12/5), INR on admit was therapeutic at 2.79. Now INR remains therapeutic at 2.63 after alternating 5 and 6 mg since Hgb stable, plts wnl.  Goal of Therapy:  INR 2-3 Monitor platelets by anticoagulation protocol: Yes   Plan:  Give Coumadin 6 mg PO today Monitor daily INR, CBC, s/s of bleed  Elenor Quinones, PharmD, BCPS Clinical Pharmacist Pager [phone] 09/23/2015 1:44 PM

## 2015-09-24 DIAGNOSIS — I2510 Atherosclerotic heart disease of native coronary artery without angina pectoris: Secondary | ICD-10-CM

## 2015-09-24 LAB — CBC
HEMATOCRIT: 44.1 % (ref 39.0–52.0)
Hemoglobin: 14 g/dL (ref 13.0–17.0)
MCH: 31.4 pg (ref 26.0–34.0)
MCHC: 31.7 g/dL (ref 30.0–36.0)
MCV: 98.9 fL (ref 78.0–100.0)
PLATELETS: 254 10*3/uL (ref 150–400)
RBC: 4.46 MIL/uL (ref 4.22–5.81)
RDW: 21.6 % — AB (ref 11.5–15.5)
WBC: 8.6 10*3/uL (ref 4.0–10.5)

## 2015-09-24 LAB — GLUCOSE, CAPILLARY
GLUCOSE-CAPILLARY: 82 mg/dL (ref 65–99)
Glucose-Capillary: 86 mg/dL (ref 65–99)
Glucose-Capillary: 99 mg/dL (ref 65–99)

## 2015-09-24 LAB — BASIC METABOLIC PANEL
Anion gap: 7 (ref 5–15)
BUN: 23 mg/dL — ABNORMAL HIGH (ref 6–20)
CALCIUM: 8.8 mg/dL — AB (ref 8.9–10.3)
CO2: 39 mmol/L — AB (ref 22–32)
CREATININE: 1.4 mg/dL — AB (ref 0.61–1.24)
Chloride: 92 mmol/L — ABNORMAL LOW (ref 101–111)
GFR, EST AFRICAN AMERICAN: 55 mL/min — AB (ref >60)
GFR, EST NON AFRICAN AMERICAN: 48 mL/min — AB (ref >60)
Glucose, Bld: 87 mg/dL (ref 65–99)
Potassium: 3.7 mmol/L (ref 3.5–5.1)
SODIUM: 138 mmol/L (ref 135–145)

## 2015-09-24 LAB — PROTIME-INR
INR: 2.76 — AB (ref 0.00–1.49)
PROTHROMBIN TIME: 28.8 s — AB (ref 11.6–15.2)

## 2015-09-24 MED ORDER — FUROSEMIDE 40 MG PO TABS: 40.0000 mg | ORAL_TABLET | Freq: Every evening | ORAL | Status: DC

## 2015-09-24 MED ORDER — FUROSEMIDE 80 MG PO TABS: 80.0000 mg | ORAL_TABLET | Freq: Every day | ORAL | Status: DC

## 2015-09-24 MED ORDER — SPIRONOLACTONE 25 MG PO TABS: 25.0000 mg | ORAL_TABLET | Freq: Every day | ORAL | Status: AC

## 2015-09-24 MED ORDER — WARFARIN SODIUM 5 MG PO TABS: 5.0000 mg | ORAL_TABLET | Freq: Once | ORAL | Status: DC

## 2015-09-24 MED ORDER — BENAZEPRIL HCL 20 MG PO TABS: 20.0000 mg | ORAL_TABLET | Freq: Every day | ORAL | Status: DC

## 2015-09-24 MED ORDER — WARFARIN SODIUM 6 MG PO TABS: 6.0000 mg | ORAL_TABLET | Freq: Every day | ORAL | Status: DC

## 2015-09-24 NOTE — Discharge Summary (Signed)
Physician Discharge Summary       Patient ID: [name] MRN: [id number] DOB/AGE: 1941/08/21 74 y.o.  Admit date: 09/19/2015 Discharge date: 09/24/2015 Primary Cardiologist:Dr. Bensimhon EP Dr. Lovena Le   Discharge Diagnoses:  Principal Problem:   Acute on chronic systolic CHF (congestive heart failure), NYHA class 3 ([place]) Active Problems:   Coronary atherosclerosis of native coronary artery   Long term (current) use of anticoagulants   Chronic atrial fibrillation (HCC)   Discharged Condition: good  Procedures: none  Hospital Course:   [name] is a 74 y.o. male with a h/o moderate nonobstructive CAD, nonischemic cardiomyopathy with CHF, HTN, atrial arrythmias (now chronic AF) and obesity. He initially had an EF of 20-25% and single chamber ICD was implanted. EF then recovered to 50% (echo 2008) and had ICD explanted and BiV pacer placed due to bradycardia. Had routine follow-up echo in 08/09/10 and showed EF back down to 25-30% which worsened again on Echo 12/06/10 EF 20-25% despite increase in HF medications. So patient underwent R&L cath on 12/21/10.  Most recent Echo 07/2014 EF 30-35%  CHA2DS2VASc 4-5.  He was last seen in the HF clinic 07/28/14 and has been non compliant with follow up since then. He was seen by Dr Lovena Le more recently, on 09/06/15 and was thought to be stable with no changes in medications. He weighed 287 lbs at that visit.   He called the HF clinic the morning of 09/19/15 with c/o SOB, fatigue, and edema stating his weight had slowly climbed from 260 lbs to 290. They were unable to work him in acutely so recommended he come to the ER for further evaluation. Pertinent ED labs include K 3.9, Cr 1.33, BNP 705.6. Initial troponin 0.17, will follow. CXR showed no edema or consolidation. Areas of mild atelactasis on the left with no change in cardiac silhouette compared to CXR 12/21/10.  He stated he has been compliant with his medications, but symptoms  have been worsening for several weeks. Has gotten to the point where he is SOB with only a few steps. Can get to the bathroom in his house from his chair, but SOB walking back to chair. Takes all medications as directed. Pees well with lasix. Says fluid intake close to 50 oz a day. Watches salt closely. Has been managing diabetes so well has recently had decrease in his insulin. Denies Orthopnea, or SOB at rest. Has had no CP, lightheadedness, or dizziness. Says this happens "every year" and needs to come in for diuresis. Doesn't follow up when he feels well. He is unsure what brought this fluid overload on.   1. Acute on chronic systolic CHF: Nonischemic cardiomyopathy. EF 30-35% on last echo in 10/15, EF 20-25% this admission. also G3DD.  Sounds like gradual onset of symptoms, was volume overloaded when he saw Dr Lovena Le last month. No acute trigger. He was admitted markedly volume overloaded, weight up a lot from baseline. He has Research officer, political party CRT-P device. He has diuresed well, now looks near-euvolemic.  -  Lasix 80 qam/40 qpm for home.  - Continue Coreg, benazepril and hydralazine  Would hold off on Imdur given ongoing Viagra use.  - Increased spironolactone to 25 mg daily. - negative 12,562 this admit -wt at discharge  267 lbs, down from 295 at peak.  2. CAD: Moderate nonobstructive CAD in past. Mild troponin elevation, no chest pain. Suspect demand ischemia in the setting of volume overload. Continue statin, does not need aspirin given warfarin use and stable  CAD.  3. Atrial fibrillation: Chronic. Continue warfarin. INR therapeutic.   4. DM-2 stable.  Pt seen and evaluated by Dr. Aundra Dubin and found stable for discharge.  Will follow up in CHF clinic in 10 days.     Consults: None  Significant Diagnostic Studies:  BMP Latest Ref Rng 09/24/2015 09/23/2015 09/22/2015  Glucose 65 - 99 mg/dL 87 69 99  BUN 6 - 20 mg/dL 23(H) 21(H) 18  Creatinine 0.61 - 1.24 mg/dL 1.40(H) 1.19  1.35(H)  Sodium 135 - 145 mmol/L 138 139 139  Potassium 3.5 - 5.1 mmol/L 3.7 4.0 3.4(L)  Chloride 101 - 111 mmol/L 92(L) 94(L) 95(L)  CO2 22 - 32 mmol/L 39(H) 35(H) 35(H)  Calcium 8.9 - 10.3 mg/dL 8.8(L) 8.9 8.5(L)   CBC Latest Ref Rng 09/24/2015 09/19/2015 12/24/2010  WBC 4.0 - 10.5 K/uL 8.6 7.5 9.3  Hemoglobin 13.0 - 17.0 g/dL 14.0 13.7 11.9(L)  Hematocrit 39.0 - 52.0 % 44.1 44.1 36.9(L)  Platelets 150 - 400 K/uL 254 238 393    INR at discharge 2.76  BNP 705  Troponin 0.17.  ECHO: Study Conclusions  - Left ventricle: The cavity size was normal. There was mild concentric hypertrophy. Systolic function was severely reduced. The estimated ejection fraction was in the range of 20% to 25%. Diffuse hypokinesis. Doppler parameters are consistent with a reversible restrictive pattern, indicative of decreased left ventricular diastolic compliance and/or increased left atrial pressure (grade 3 diastolic dysfunction). - Aortic valve: There was mild regurgitation. - Mitral valve: There was mild regurgitation. - Left atrium: The atrium was mildly dilated. - Right ventricle: The cavity size was mildly dilated. Wall thickness was normal. Pacer wire or catheter noted in right ventricle. Systolic function was moderately reduced. - Right atrium: The atrium was mildly dilated. - Pulmonary arteries: Systolic pressure was moderately increased. PA peak pressure: 58 mm Hg (S).  Impressions:  - When compared to prior echocardiogram, EF is reduced.  CXR: CHEST 2 VIEW  COMPARISON: December 21, 2010  FINDINGS: There is mild scarring in the left mid lung and left base regions. There is no edema or consolidation. The heart is upper normal in size with pulmonary vascularity within normal limits. Pacemaker leads are attached to the right atrium, right ventricle, and left ventricle. No pneumothorax. No adenopathy. There is mild degenerative change in the thoracic spine. Stable  mild elevation of the right hemidiaphragm persists.  IMPRESSION: No edema or consolidation. Areas of mild atelectasis on the left. No change in cardiac silhouette.   Discharge Exam: Blood pressure 112/70, pulse 82, temperature 97.4 F (36.3 C), temperature source Oral, resp. rate 20, height 6\' 1"  (1.854 m), weight 267 lb (121.11 kg), SpO2 93 %.  Disposition: 01-Home or Self Care     Medication List    ASK your doctor about these medications        acarbose 100 MG tablet  Commonly known as:  PRECOSE  Take 50 mg by mouth 3 (three) times daily with meals.     allopurinol 300 MG tablet  Commonly known as:  ZYLOPRIM  Take 300 mg by mouth daily.     aspirin 81 MG chewable tablet  Chew 81 mg by mouth daily.     benazepril 20 MG tablet  Commonly known as:  LOTENSIN  Take 10 mg by mouth daily.     carvedilol 25 MG tablet  Commonly known as:  COREG  Take 25 mg by mouth 2 (two) times daily with a meal.  digoxin 0.125 MG tablet  Commonly known as:  LANOXIN  Take 1 tablet (125 mcg total) by mouth daily.     ferrous sulfate 325 (65 FE) MG tablet  Take 325 mg by mouth daily with breakfast.     furosemide 40 MG tablet  Commonly known as:  LASIX  Take 1 tablet (40 mg total) by mouth 2 (two) times daily.     hydrALAZINE 50 MG tablet  Commonly known as:  APRESOLINE  Take 1 tablet (50 mg total) by mouth 3 (three) times daily.     HYDROPHILIC EX  Apply 1 application topically daily as needed (For dry skin/heels).     hydroxyurea 500 MG capsule  Commonly known as:  HYDREA  Take 500 mg by mouth daily. May take with food to minimize GI side effects.     insulin NPH Human 100 UNIT/ML injection  Commonly known as:  HUMULIN N,NOVOLIN N  Inject 10-20 Units into the skin See admin instructions. Take 10 units 30 minutes before last meal and 20 units 1 hour before first meal, 5 units in evening, if BS >110, if BS <110, give 2.5units     potassium chloride SA 20 MEQ tablet    Commonly known as:  KLOR-CON M20  Take 2 tablets (40 mEq total) by mouth daily.     sildenafil 100 MG tablet  Commonly known as:  VIAGRA  TAKE 1 TABLET BY MOUTH EVERY DAY AS NEEDED FOR ERECTILE DYSFUNCTION     simvastatin 20 MG tablet  Commonly known as:  ZOCOR  Take 1 tablet (20 mg total) by mouth at bedtime.     terazosin 2 MG capsule  Commonly known as:  HYTRIN  Take 2 mg by mouth at bedtime. For help with urination     warfarin 6 MG tablet  Commonly known as:  COUMADIN  Take as directed by the coumadin clinic       Follow-up Information    Follow up with Glori Bickers, MD On 10/10/2015.   Specialty:  Cardiology   Why:  at 0920 am for post hospital follow up. Please bring all of your medications to your visit. The code for pt parking is 0009.   Contact information:   [place] [address] [phone]        Discharge Instructions: Weigh daily Call HF clinic if weight climbs more than 3 pounds in a day or 5 pounds in a week. No salt to very little salt in your diet.  No more than 2000 mg in a day. Call if increased shortness of breath or increased swelling.  Low salt diabetic diet    Signed: [id number] R Nurse Practitioner-Certified [facility]: [facility] 09/24/2015, 12:57 PM  Time spent on discharge :> 30  minutes.

## 2015-09-24 NOTE — Progress Notes (Signed)
CM paged Cecilie Kicks, NP for orders for [facility] PT and RN. CM called and notified RN caring for patient who states that she will also call.

## 2015-09-24 NOTE — Progress Notes (Signed)
ANTICOAGULATION CONSULT NOTE - Follow Up Consult  Pharmacy Consult for Coumadin Indication: atrial fibrillation  No Known Allergies  Patient Measurements: Height: 6\' 1"  (185.4 cm) Weight: 267 lb (121.11 kg) IBW/kg (Calculated) : 79.9 Heparin Dosing Weight:   Vital Signs: Temp: 97.4 F (36.3 C) (12/11 1241) Temp Source: Oral (12/11 1241) BP: 112/70 mmHg (12/11 1241) Pulse Rate: 82 (12/11 1241)  Labs:  Recent Labs  09/22/15 0439 09/23/15 0447 09/24/15 0520  HGB  --   --  14.0  HCT  --   --  44.1  PLT  --   --  254  LABPROT 28.0* 27.7* 28.8*  INR 2.66* 2.63* 2.76*  CREATININE 1.35* 1.19 1.40*    Estimated Creatinine Clearance: 62.2 mL/min (by C-G formula based on Cr of 1.4).   Medications:  Scheduled:  . acarbose  50 mg Oral TID WC  . allopurinol  300 mg Oral Daily  . benazepril  20 mg Oral Daily  . carvedilol  25 mg Oral BID WC  . digoxin  125 mcg Oral Daily  . ferrous sulfate  325 mg Oral Q breakfast  . furosemide  40 mg Oral QPM  . furosemide  80 mg Oral Q breakfast  . hydrALAZINE  50 mg Oral TID  . hydroxyurea  500 mg Oral Daily  . insulin NPH Human  10 Units Subcutaneous QAC supper  . insulin NPH Human  20 Units Subcutaneous QAC breakfast  . insulin NPH Human  3-5 Units Subcutaneous QHS  . potassium chloride SA  40 mEq Oral Daily  . simvastatin  20 mg Oral QHS  . sodium chloride  3 mL Intravenous Q12H  . spironolactone  25 mg Oral Daily  . terazosin  2 mg Oral QHS  . Warfarin - Pharmacist Dosing Inpatient   Does not apply q1800    Assessment: 74yo male on Coumadin for AFib.  INR 2.76 this AM, Hg and pltc wnl.  No bleeding noted.  Goal of Therapy:  INR 2-3 Monitor platelets by anticoagulation protocol: Yes   Plan:  Coumadin 5mg  today F/U INR Watch for s/s of bleeding  Gracy Bruins, PharmD Clinical Pharmacist [facility]

## 2015-09-24 NOTE — Progress Notes (Signed)
Patient ID: [name], male   DOB: 1941-07-23, 74 y.o.   MRN: [id number]    Advanced Heart Failure Rounding Note  Primary Cardiologist: Dr Haroldine Laws EP: Dr Lovena Le  Subjective:    Lasix converted to po yesterday, weight down 2 lbs.  No dyspnea.  Still has leg edema.    Objective:   Weight Range: 267 lb (121.11 kg) Body mass index is 35.23 kg/(m^2).   Vital Signs:   Temp:  [97.6 F (36.4 C)-98 F (36.7 C)] 98 F (36.7 C) (12/11 0547) Pulse Rate:  [73-76] 73 (12/10 2024) Resp:  [20] 20 (12/11 0547) BP: (115-135)/(67-79) 115/67 mmHg (12/11 0547) SpO2:  [98 %-100 %] 98 % (12/11 0547) Weight:  [267 lb (121.11 kg)] 267 lb (121.11 kg) (12/11 0547) Last BM Date: 09/19/15  Weight change: Filed Weights   09/22/15 0501 09/23/15 0616 09/24/15 0547  Weight: 277 lb 14.4 oz (126.055 kg) 269 lb 8 oz (122.244 kg) 267 lb (121.11 kg)    Intake/Output:   Intake/Output Summary (Last 24 hours) at 09/24/15 1206 Last data filed at 09/24/15 0936  Gross per 24 hour  Intake    960 ml  Output   1440 ml  Net   -480 ml     Physical Exam:  General: Elderly appearing, no resp distress Psych: Normal affect. HEENT: Normocephalic, atraumatic. Neck: Supple, no bruits. JVD 7-8 cm  Carotids 2+. No thyromegaly or lymphadenopathy.  Heart: PMI laterally displaced. RRR no s3, s4, or murmurs noted. Lungs: CTAB, NAD Abdomen: obese Soft, NT, ND, No HSM, BS + x 4.  Extremities: No clubbing, cyanosis. 1+ ankle edema.   Neuro: Alert and oriented X 3. Moves all extremities spontaneously. Normal pedal pulses  Telemetry: atrial fibrillation with v-pacing   Labs: CBC  Recent Labs  09/24/15 0520  WBC 8.6  HGB 14.0  HCT 44.1  MCV 98.9  PLT 0000000   Basic Metabolic Panel  Recent Labs  09/22/15 0439 09/23/15 0447 09/24/15 0520  NA 139 139 138  K 3.4* 4.0 3.7  CL 95* 94* 92*  CO2 35* 35* 39*  GLUCOSE 99 69 87  BUN 18 21* 23*  CREATININE 1.35* 1.19 1.40*  CALCIUM 8.5* 8.9 8.8*    MG 2.1  --   --     BNP: BNP (last 3 results)  Recent Labs  09/19/15 1245  BNP 705.6*    Other results:    Imaging/Studies:  No results found.  Latest Echo  Latest Cath   Medications:     Scheduled Medications: . acarbose  50 mg Oral TID WC  . allopurinol  300 mg Oral Daily  . aspirin  81 mg Oral Daily  . benazepril  20 mg Oral Daily  . carvedilol  25 mg Oral BID WC  . digoxin  125 mcg Oral Daily  . ferrous sulfate  325 mg Oral Q breakfast  . furosemide  40 mg Oral QPM  . furosemide  80 mg Oral Q breakfast  . hydrALAZINE  50 mg Oral TID  . hydroxyurea  500 mg Oral Daily  . insulin NPH Human  10 Units Subcutaneous QAC supper  . insulin NPH Human  20 Units Subcutaneous QAC breakfast  . insulin NPH Human  3-5 Units Subcutaneous QHS  . potassium chloride SA  40 mEq Oral Daily  . simvastatin  20 mg Oral QHS  . sodium chloride  3 mL Intravenous Q12H  . spironolactone  25 mg Oral Daily  . terazosin  2  mg Oral QHS  . Warfarin - Pharmacist Dosing Inpatient   Does not apply q1800    Infusions:    PRN Medications: sodium chloride, acetaminophen, ondansetron (ZOFRAN) IV, sodium chloride   Assessment/Plan  1. Acute on chronic systolic CHF: Nonischemic cardiomyopathy. EF 30-35% on last echo in 10/15, EF 20-25% this admission. Sounds like gradual onset of symptoms, was volume overloaded when he saw Dr Lovena Le last month. No acute trigger. He was admitted markedly volume overloaded, weight up a lot from baseline. He has Research officer, political party CRT-P device. He has diuresed well, now looks near-euvolemic.  - Continue current po Lasix.  - Continue Coreg, benazepril and hydralazine at current dosing.  Would hold off on Imdur given ongoing Viagra use.  - Continue spironolactone 25 daily. 2. CAD: Moderate nonobstructive CAD in past. Mild troponin elevation, no chest pain. Suspect demand ischemia in the setting of volume overload. Continue statin, does not need aspirin given  warfarin use and stable CAD. 3. Atrial fibrillation: Chronic. Continue warfarin. INR therapeutic.  4. Dispo: Home today.  Followup with coumadin clinic as scheduled.  Needs close followup in CHF clinic with Dr Haroldine Laws, 10 days or so.  Meds: Lasix 80 qam/40 qpm, KCl 40 daily, spironolactone 25 daily, hydralazine 50 tid, Zocor 20 qhs, benazepril 20 daily, warfarin. Can stpp ASA.   Loralie Champagne 09/24/2015 12:06 PM

## 2015-09-25 ENCOUNTER — Encounter (HOSPITAL_COMMUNITY): Payer: Self-pay | Admitting: Student

## 2015-10-03 ENCOUNTER — Ambulatory Visit (INDEPENDENT_AMBULATORY_CARE_PROVIDER_SITE_OTHER): Payer: Medicare Other | Admitting: Pharmacist

## 2015-10-03 DIAGNOSIS — Z7901 Long term (current) use of anticoagulants: Secondary | ICD-10-CM | POA: Diagnosis not present

## 2015-10-03 DIAGNOSIS — I480 Paroxysmal atrial fibrillation: Secondary | ICD-10-CM

## 2015-10-03 DIAGNOSIS — I4891 Unspecified atrial fibrillation: Secondary | ICD-10-CM | POA: Diagnosis not present

## 2015-10-03 LAB — POCT INR: INR: 2.5

## 2015-10-10 ENCOUNTER — Encounter (HOSPITAL_COMMUNITY): Payer: Self-pay | Admitting: Internal Medicine

## 2015-10-10 ENCOUNTER — Ambulatory Visit (HOSPITAL_COMMUNITY)
Admit: 2015-10-10 | Discharge: 2015-10-10 | Disposition: A | Discharge status: Home / Self Care | Payer: Medicare Other | Source: Ambulatory Visit | Attending: Internal Medicine | Admitting: Internal Medicine

## 2015-10-10 VITALS — BP 124/74 | HR 81 | Wt 250.8 lb

## 2015-10-10 DIAGNOSIS — I2510 Atherosclerotic heart disease of native coronary artery without angina pectoris: Secondary | ICD-10-CM | POA: Diagnosis not present

## 2015-10-10 DIAGNOSIS — E119 Type 2 diabetes mellitus without complications: Secondary | ICD-10-CM | POA: Diagnosis not present

## 2015-10-10 DIAGNOSIS — I482 Chronic atrial fibrillation, unspecified: Secondary | ICD-10-CM

## 2015-10-10 DIAGNOSIS — I110 Hypertensive heart disease with heart failure: Secondary | ICD-10-CM | POA: Diagnosis not present

## 2015-10-10 DIAGNOSIS — I10 Essential (primary) hypertension: Secondary | ICD-10-CM | POA: Diagnosis not present

## 2015-10-10 DIAGNOSIS — I5022 Chronic systolic (congestive) heart failure: Secondary | ICD-10-CM | POA: Diagnosis not present

## 2015-10-10 LAB — BRAIN NATRIURETIC PEPTIDE: B Natriuretic Peptide: 328.5 pg/mL — ABNORMAL HIGH (ref 0.0–100.0)

## 2015-10-10 LAB — BASIC METABOLIC PANEL
ANION GAP: 10 (ref 5–15)
BUN: 17 mg/dL (ref 6–20)
CHLORIDE: 104 mmol/L (ref 101–111)
CO2: 29 mmol/L (ref 22–32)
Calcium: 9.5 mg/dL (ref 8.9–10.3)
Creatinine, Ser: 1.4 mg/dL — ABNORMAL HIGH (ref 0.61–1.24)
GFR calc Af Amer: 55 mL/min — ABNORMAL LOW (ref >60)
GFR calc non Af Amer: 48 mL/min — ABNORMAL LOW (ref >60)
GLUCOSE: 90 mg/dL (ref 65–99)
POTASSIUM: 4.1 mmol/L (ref 3.5–5.1)
Sodium: 143 mmol/L (ref 135–145)

## 2015-10-10 MED ORDER — FUROSEMIDE 80 MG PO TABS: 80.0000 mg | ORAL_TABLET | Freq: Every day | ORAL | Status: AC

## 2015-10-10 NOTE — Patient Instructions (Signed)
Labs today  We will contact you in 3 months to schedule your next appointment.  

## 2015-10-10 NOTE — Progress Notes (Signed)
Patient ID: [name], male   DOB: Jun 14, 1941, 74 y.o.   MRN: [id number]  PCP: Dr Arletha Grippe at [facility] in [place] Primary Cardiologist: Dr Tempie Hoist EP: Dr Lovena Le  HPI:  [name] is a 74 y.o. male with a h/o moderate nonobstructive CAD, nonischemic cardiomyopathy with CHF, HTN, atrial arrythmias (now chronic AF) and obesity. He initially had an EF of 20-25% and single chamber ICD was implanted. EF then recovered to 50% (echo 2008) and had ICD explanted and BiV pacer placed due to bradycardia. Had routine follow-up echo in 08/09/10 and showed EF back down to 25-30% which worsened again on Echo 12/06/10 EF 20-25% despite increase in HF medications. Patient underwent R&L cath on 12/21/10, full results as below. Echo 07/2014 EF 30-35%   Admitted 09/19/15 with volume overload after long period of non-compliance with HF follow up.  Hospital course was relatively uncomplicated with diuresis on IV lasix. Overall diuresed > 12 L and down 28 lbs. Discharge weight 267 lbs. Echo 09/20/15 with EF 20-25% with Grade 3 DD  He reports today for post hospital follow up. Weight is down another 17 lbs from discharge. Says he is only taking lasix 80 mg daily (not 80 mg am/ 40 mg pm as directed). Denies SOB walking around house.  Has problems with steps, has to stop after 7-8 steps. No CP or palpitations. No lightheadedness or dizziness.  Watching fluid and salt intake.   R/LHC 12/21/10 1) Mod nonobs CAD (mLAD 70%, m-dLAD 40%; oDx 60%; mCFX 30-40%; pRCA 30%)  2) RA 21 RV 81/16/24 PA 81/36 (56) PCWP 33 (v to 43) PA sat 43% 48%, Fick 4.2/1.6   Past Medical History  Diagnosis Date  . Obesities, morbid ([place])   . Hypertension   . Hyperlipidemia   . Diabetes mellitus   . Polycythemia     vera requiring phlebotomy and treatment with hydroxyurea on a chronic bais. The patient followed by the [facility] for this.  . CHF (congestive heart failure) ([place])     secondary to nonischemic cardiomyopathy. EF initially 20-25% up  to 50% in 2008. Echo 2/12 EF 20-25% . RHC 3/12: RA 21  RV 81/16/24  PA 81/36 (56) PCWP 33 (v to 43) PA sat 43% 48%.status post Medtronic biventricular ICD placement.(with Sprint Fidelis leads)-explanted--now with St.Jude Anthem BivPM   . 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 Atrial fibrillation (HCC)     Chronic. On coumadin.  . Coronary artery disease, non-occlusive     Cath 3/12: mLAD 70%, m-dLAD 40%; Dx 60%; mCFX 30-40%; pRCA 30%)    Current Outpatient Prescriptions  Medication Sig Dispense Refill  . acarbose (PRECOSE) 100 MG tablet Take 50 mg by mouth 3 (three) times daily with meals.    Marland Kitchen allopurinol (ZYLOPRIM) 300 MG tablet Take 300 mg by mouth daily.      . benazepril (LOTENSIN) 20 MG tablet Take 1 tablet (20 mg total) by mouth daily. 30 tablet 6  . carvedilol (COREG) 25 MG tablet Take 25 mg by mouth 2 (two) times daily with a meal.    . digoxin (LANOXIN) 0.125 MG tablet Take 1 tablet (125 mcg total) by mouth daily. 90 tablet 6  . ferrous sulfate 325 (65 FE) MG tablet Take 325 mg by mouth daily with breakfast.     . furosemide (LASIX) 80 MG tablet Take 1 tablet (80 mg total) by mouth daily with breakfast. 30 tablet 6  . hydrALAZINE (APRESOLINE) 50 MG tablet  Take 1 tablet (50 mg total) by mouth 3 (three) times daily. 270 tablet 6  . HYDROPHILIC EX Apply 1 application topically daily as needed (For dry skin/heels).     . hydroxyurea (HYDREA) 500 MG capsule Take 500 mg by mouth daily. May take with food to minimize GI side effects.    . insulin NPH (HUMULIN N,NOVOLIN N) 100 UNIT/ML injection Inject 10-20 Units into the skin See admin instructions. Take 10 units 30 minutes before last meal and 20 units 1 hour before first meal, 5 units in evening, if BS >110, if BS <110, give 2.5 units    . potassium chloride SA (K-DUR,KLOR-CON) 20 MEQ tablet Take 20 mEq by mouth 2 (two) times daily.    . sildenafil (VIAGRA) 100 MG tablet TAKE 1 TABLET BY MOUTH EVERY DAY AS NEEDED  FOR ERECTILE DYSFUNCTION 10 tablet 2  . simvastatin (ZOCOR) 20 MG tablet Take 1 tablet (20 mg total) by mouth at bedtime. 90 tablet 6  . spironolactone (ALDACTONE) 25 MG tablet Take 1 tablet (25 mg total) by mouth daily. 30 tablet 6  . terazosin (HYTRIN) 2 MG capsule Take 2 mg by mouth at bedtime. For help with urination    . warfarin (COUMADIN) 6 MG tablet Take 1 tablet (6 mg total) by mouth daily. Take as directed by the coumadin clinic 120 tablet 3   No current facility-administered medications for this encounter.    No Known Allergies    Social History   Social History  . Marital Status: Married    Spouse Name: N/A  . Number of Children: N/A  . Years of Education: N/A   Occupational History  . Not on file.   Social History Main Topics  . Smoking status: Former Research scientist (life sciences)  . Smokeless tobacco: Not on file  . Alcohol Use: No  . Drug Use: No  . Sexual Activity: Not on file   Other Topics Concern  . Not on file   Social History Narrative   Retired   Married    Tobacco Use - No   Alcohol Use - No     No family history on file.  Filed Vitals:   10/10/15 0926  BP: 124/74  Pulse: 81  Weight: 250 lb 12 oz (113.739 kg)  SpO2: 95%    PHYSICAL EXAM: General: Elderly appearing, no resp distress Psych: Normal affect. HEENT: Normal Neck: Supple, no bruits. JVD 6-7 cm with mild HJR. Carotids 2+. No thyromegaly or nodule noted.Marland Kitchen  Heart: PMI laterally displaced. RRR no s3, s4, or murmurs noted. Lungs: Clear Abdomen: Obese, soft, non-tender, non-distended, No HSM, BS + x 4.  Extremities: No clubbing, cyanosis.  Trace to 1+ pre tibial edema.  Neuro: Alert and oriented X 3. Moves all extremities spontaneously. Normal pedal pulses   ASSESSMENT & PLAN:  1. Chronic systolic CHF: Nonischemic cardiomyopathy. Echo 09/20/15 EF 20-25% with Grade 3 DD s/p CRT-D - volume status much improved on exam. Continue Lasix 80 daily. Can take an extra 40 mg daily if weight starts  to go back up.   - Continue Coreg, benazepril and hydralazine  No nitrates with ongoing viagra use.  - Continue spironolactone to 25 mg daily. - Encouraged to remain compliant with HF follow up. Encouraged to call HF clinic if weight goes up 3 lbs overnight or 5 lbs in a week.  2. CAD:  - Moderate nonobstructive CAD in past. - Continue statin, No ASA warfarin use and stable CAD. 3. Atrial fibrillation: Chronic.Continue warfarin.  Follows at coumadin clinic 4. DM-2 - Per primary 5. HTN - well controlled   Will check BMET/BNP today and follow up 3 months.   Shirley Friar  PA-C 10/10/2015   Patient seen and examined with Oda Kilts, PA-C. We discussed all aspects of the encounter. I agree with the assessment and plan as stated above.   Much improved since discharge. NYHA II. Volume status looks good. He refused Entresto. BP better controlled now. Continue coumadin for AF. Reinforced need for daily weights and reviewed use of sliding scale diuretics.  Will need repeat echo in several months to see if EF improves with re-institution of medical therapy.   Tammi Boulier,MD 6:34 PM

## 2015-10-10 NOTE — Progress Notes (Signed)
Advanced Heart Failure Medication Review by a Pharmacist  Does the patient  feel that his/her medications are working for him/her?  yes  Has the patient been experiencing any side effects to the medications prescribed?  no  Does the patient measure his/her own blood pressure or blood glucose at home?  yes   Does the patient have any problems obtaining medications due to transportation or finances?   no  Understanding of regimen: fair Understanding of indications: fair Potential of compliance: excellent Patient understands to avoid NSAIDs. Patient understands to avoid decongestants.  Issues to address at subsequent visits: None   Pharmacist comments:  [name] is a pleasant 74 yo M presenting with his wife but without a medication list. His wife seems to have a good understanding of his regimen but seemed somewhat confused about his lasix dosing. They did not have any specific medication-related questions or concerns for me at this time.   Ruta Hinds. Velva Harman, PharmD, BCPS, CPP Clinical Pharmacist Pager: [phone] Phone: [phone] 10/10/2015 9:43 AM      Time with patient: 8 minutes Preparation and documentation time: 2 minutes Total time: 10 minutes

## 2015-11-14 ENCOUNTER — Ambulatory Visit (INDEPENDENT_AMBULATORY_CARE_PROVIDER_SITE_OTHER): Payer: Medicare Other | Admitting: *Deleted

## 2015-11-14 DIAGNOSIS — I4891 Unspecified atrial fibrillation: Secondary | ICD-10-CM | POA: Diagnosis not present

## 2015-11-14 DIAGNOSIS — I482 Chronic atrial fibrillation, unspecified: Secondary | ICD-10-CM

## 2015-11-14 DIAGNOSIS — Z7901 Long term (current) use of anticoagulants: Secondary | ICD-10-CM | POA: Diagnosis not present

## 2015-11-14 LAB — POCT INR: INR: 2.6

## 2015-12-06 ENCOUNTER — Ambulatory Visit (INDEPENDENT_AMBULATORY_CARE_PROVIDER_SITE_OTHER): Payer: Medicare Other | Admitting: *Deleted

## 2015-12-06 DIAGNOSIS — Z950 Presence of cardiac pacemaker: Secondary | ICD-10-CM

## 2015-12-06 DIAGNOSIS — I482 Chronic atrial fibrillation, unspecified: Secondary | ICD-10-CM

## 2015-12-06 DIAGNOSIS — I480 Paroxysmal atrial fibrillation: Secondary | ICD-10-CM

## 2015-12-06 NOTE — Progress Notes (Signed)
Remote pacemaker transmission.   

## 2015-12-26 ENCOUNTER — Ambulatory Visit (INDEPENDENT_AMBULATORY_CARE_PROVIDER_SITE_OTHER): Payer: Medicare Other | Admitting: *Deleted

## 2015-12-26 DIAGNOSIS — I480 Paroxysmal atrial fibrillation: Secondary | ICD-10-CM | POA: Diagnosis not present

## 2015-12-26 DIAGNOSIS — Z7901 Long term (current) use of anticoagulants: Secondary | ICD-10-CM

## 2015-12-26 DIAGNOSIS — I4891 Unspecified atrial fibrillation: Secondary | ICD-10-CM

## 2015-12-26 LAB — POCT INR: INR: 2.3

## 2016-01-03 ENCOUNTER — Encounter: Payer: Self-pay | Admitting: Cardiology

## 2016-01-03 LAB — CUP PACEART REMOTE DEVICE CHECK
Battery Remaining Percentage: 17 %
Brady Statistic AP VP Percent: 1 %
Brady Statistic AS VP Percent: 97 %
Brady Statistic RA Percent Paced: 1 %
Implantable Lead Implant Date: 20060828
Implantable Lead Location: 753858
Implantable Lead Model: 5076
Lead Channel Impedance Value: 450 Ohm
Lead Channel Impedance Value: 560 Ohm
Lead Channel Impedance Value: 650 Ohm
Lead Channel Pacing Threshold Amplitude: 0.75 V
Lead Channel Pacing Threshold Pulse Width: 0.5 ms
Lead Channel Setting Pacing Amplitude: 2 V
Lead Channel Setting Pacing Amplitude: 2.5 V
Lead Channel Setting Pacing Pulse Width: 0.5 ms
Lead Channel Setting Pacing Pulse Width: 0.7 ms
MDC IDC LEAD IMPLANT DT: 20060828
MDC IDC LEAD IMPLANT DT: 20110209
MDC IDC LEAD LOCATION: 753859
MDC IDC LEAD LOCATION: 753860
MDC IDC LEAD MODEL: 4194
MDC IDC MSMT BATTERY REMAINING LONGEVITY: 14 mo
MDC IDC MSMT BATTERY VOLTAGE: 2.75 V
MDC IDC MSMT LEADCHNL RV SENSING INTR AMPL: 12 mV
MDC IDC PG SERIAL: 2428292
MDC IDC SESS DTM: 20170222144934
MDC IDC SET LEADCHNL RV PACING AMPLITUDE: 2 V
MDC IDC SET LEADCHNL RV SENSING SENSITIVITY: 2.5 mV
MDC IDC STAT BRADY AP VS PERCENT: 1 %
MDC IDC STAT BRADY AS VS PERCENT: 2.6 %

## 2016-02-06 ENCOUNTER — Ambulatory Visit (INDEPENDENT_AMBULATORY_CARE_PROVIDER_SITE_OTHER): Payer: Medicare Other

## 2016-02-06 DIAGNOSIS — I4891 Unspecified atrial fibrillation: Secondary | ICD-10-CM | POA: Diagnosis not present

## 2016-02-06 DIAGNOSIS — Z7901 Long term (current) use of anticoagulants: Secondary | ICD-10-CM

## 2016-02-06 DIAGNOSIS — I480 Paroxysmal atrial fibrillation: Secondary | ICD-10-CM | POA: Diagnosis not present

## 2016-02-06 LAB — POCT INR: INR: 2.3

## 2016-03-02 ENCOUNTER — Other Ambulatory Visit (HOSPITAL_COMMUNITY): Payer: Self-pay | Admitting: Internal Medicine

## 2016-03-06 ENCOUNTER — Ambulatory Visit (INDEPENDENT_AMBULATORY_CARE_PROVIDER_SITE_OTHER): Payer: Medicare Other | Admitting: *Deleted

## 2016-03-06 DIAGNOSIS — Z950 Presence of cardiac pacemaker: Secondary | ICD-10-CM | POA: Diagnosis not present

## 2016-03-06 DIAGNOSIS — I480 Paroxysmal atrial fibrillation: Secondary | ICD-10-CM

## 2016-03-06 NOTE — Progress Notes (Signed)
Remote pacemaker transmission.   

## 2016-03-21 ENCOUNTER — Ambulatory Visit (INDEPENDENT_AMBULATORY_CARE_PROVIDER_SITE_OTHER): Payer: Medicare Other | Admitting: Surgery

## 2016-03-21 DIAGNOSIS — I4891 Unspecified atrial fibrillation: Secondary | ICD-10-CM | POA: Diagnosis not present

## 2016-03-21 DIAGNOSIS — I480 Paroxysmal atrial fibrillation: Secondary | ICD-10-CM

## 2016-03-21 DIAGNOSIS — Z7901 Long term (current) use of anticoagulants: Secondary | ICD-10-CM | POA: Diagnosis not present

## 2016-03-21 LAB — POCT INR: INR: 2.3

## 2016-03-25 LAB — CUP PACEART REMOTE DEVICE CHECK
Brady Statistic AP VP Percent: 1 %
Brady Statistic AS VP Percent: 97 %
Brady Statistic AS VS Percent: 2.6 %
Implantable Lead Implant Date: 20060828
Implantable Lead Implant Date: 20110209
Implantable Lead Model: 5076
Lead Channel Impedance Value: 430 Ohm
Lead Channel Pacing Threshold Amplitude: 0.625 V
Lead Channel Pacing Threshold Pulse Width: 0.5 ms
Lead Channel Setting Pacing Amplitude: 2 V
Lead Channel Setting Pacing Amplitude: 2.5 V
Lead Channel Setting Pacing Pulse Width: 0.5 ms
Lead Channel Setting Pacing Pulse Width: 0.7 ms
MDC IDC LEAD IMPLANT DT: 20060828
MDC IDC LEAD LOCATION: 753858
MDC IDC LEAD LOCATION: 753859
MDC IDC LEAD LOCATION: 753860
MDC IDC LEAD MODEL: 4194
MDC IDC MSMT BATTERY REMAINING LONGEVITY: 9 mo
MDC IDC MSMT BATTERY REMAINING PERCENTAGE: 10 %
MDC IDC MSMT BATTERY VOLTAGE: 2.71 V
MDC IDC MSMT LEADCHNL LV IMPEDANCE VALUE: 580 Ohm
MDC IDC MSMT LEADCHNL RA IMPEDANCE VALUE: 440 Ohm
MDC IDC MSMT LEADCHNL RV SENSING INTR AMPL: 10.9 mV
MDC IDC PG SERIAL: 2428292
MDC IDC SESS DTM: 20170524124651
MDC IDC SET LEADCHNL RV PACING AMPLITUDE: 2 V
MDC IDC SET LEADCHNL RV SENSING SENSITIVITY: 2.5 mV
MDC IDC STAT BRADY AP VS PERCENT: 1 %
MDC IDC STAT BRADY RA PERCENT PACED: 1 %

## 2016-04-02 ENCOUNTER — Encounter: Payer: Self-pay | Admitting: Cardiology

## 2016-05-02 ENCOUNTER — Ambulatory Visit (INDEPENDENT_AMBULATORY_CARE_PROVIDER_SITE_OTHER): Payer: Medicare Other | Admitting: *Deleted

## 2016-05-02 DIAGNOSIS — I480 Paroxysmal atrial fibrillation: Secondary | ICD-10-CM | POA: Diagnosis not present

## 2016-05-02 DIAGNOSIS — I4891 Unspecified atrial fibrillation: Secondary | ICD-10-CM

## 2016-05-02 DIAGNOSIS — Z7901 Long term (current) use of anticoagulants: Secondary | ICD-10-CM

## 2016-05-02 LAB — POCT INR: INR: 2.3

## 2016-06-02 ENCOUNTER — Other Ambulatory Visit (HOSPITAL_COMMUNITY): Payer: Self-pay | Admitting: Internal Medicine

## 2016-06-05 ENCOUNTER — Ambulatory Visit (INDEPENDENT_AMBULATORY_CARE_PROVIDER_SITE_OTHER): Payer: Medicare Other | Admitting: *Deleted

## 2016-06-05 DIAGNOSIS — I480 Paroxysmal atrial fibrillation: Secondary | ICD-10-CM | POA: Diagnosis not present

## 2016-06-05 DIAGNOSIS — Z950 Presence of cardiac pacemaker: Secondary | ICD-10-CM

## 2016-06-05 NOTE — Progress Notes (Signed)
Remote pacemaker transmission.   

## 2016-06-12 ENCOUNTER — Encounter: Payer: Self-pay | Admitting: Cardiology

## 2016-06-13 ENCOUNTER — Ambulatory Visit (INDEPENDENT_AMBULATORY_CARE_PROVIDER_SITE_OTHER): Payer: Medicare Other | Admitting: *Deleted

## 2016-06-13 DIAGNOSIS — Z7901 Long term (current) use of anticoagulants: Secondary | ICD-10-CM

## 2016-06-13 DIAGNOSIS — I4891 Unspecified atrial fibrillation: Secondary | ICD-10-CM | POA: Diagnosis not present

## 2016-06-13 LAB — POCT INR: INR: 2.3

## 2016-06-18 LAB — CUP PACEART REMOTE DEVICE CHECK
Battery Remaining Longevity: 3 mo
Brady Statistic AP VS Percent: 1 %
Brady Statistic AS VS Percent: 2.1 %
Date Time Interrogation Session: 20170823115208
Implantable Lead Implant Date: 20060828
Implantable Lead Location: 753858
Implantable Lead Location: 753859
Implantable Lead Location: 753860
Implantable Lead Model: 4194
Lead Channel Impedance Value: 630 Ohm
Lead Channel Pacing Threshold Amplitude: 0.625 V
Lead Channel Pacing Threshold Amplitude: 1.5 V
Lead Channel Pacing Threshold Pulse Width: 0.5 ms
Lead Channel Pacing Threshold Pulse Width: 0.7 ms
Lead Channel Sensing Intrinsic Amplitude: 9.9 mV
Lead Channel Setting Pacing Amplitude: 2 V
Lead Channel Setting Sensing Sensitivity: 2.5 mV
MDC IDC LEAD IMPLANT DT: 20060828
MDC IDC LEAD IMPLANT DT: 20110209
MDC IDC MSMT BATTERY REMAINING PERCENTAGE: 3 %
MDC IDC MSMT BATTERY VOLTAGE: 2.65 V
MDC IDC MSMT LEADCHNL RA IMPEDANCE VALUE: 530 Ohm
MDC IDC MSMT LEADCHNL RA SENSING INTR AMPL: 0.5 mV
MDC IDC MSMT LEADCHNL RV IMPEDANCE VALUE: 450 Ohm
MDC IDC SET LEADCHNL LV PACING AMPLITUDE: 2.5 V
MDC IDC SET LEADCHNL LV PACING PULSEWIDTH: 0.7 ms
MDC IDC SET LEADCHNL RA PACING AMPLITUDE: 2 V
MDC IDC SET LEADCHNL RV PACING PULSEWIDTH: 0.5 ms
MDC IDC STAT BRADY AP VP PERCENT: 1 %
MDC IDC STAT BRADY AS VP PERCENT: 97 %
MDC IDC STAT BRADY RA PERCENT PACED: 1 %
Pulse Gen Model: 3210
Pulse Gen Serial Number: 2428292

## 2016-07-23 ENCOUNTER — Telehealth: Payer: Self-pay | Admitting: Cardiology

## 2016-07-23 NOTE — Telephone Encounter (Signed)
Pt's wife called this morning stating that he has been vomiting all weekend and unable to hold anything down. Denies any fever or sick contacts at home. Reports he has been unable to hold down most of his medications. Asking if he can be seen in the clinic today. I advised that they come to the ED for evaluation and possible IV fluids given the length of his symptoms. They were agreeable to this plan.

## 2016-07-25 ENCOUNTER — Ambulatory Visit (INDEPENDENT_AMBULATORY_CARE_PROVIDER_SITE_OTHER): Payer: Medicare Other

## 2016-07-25 ENCOUNTER — Encounter (INDEPENDENT_AMBULATORY_CARE_PROVIDER_SITE_OTHER): Payer: Self-pay

## 2016-07-25 DIAGNOSIS — Z7901 Long term (current) use of anticoagulants: Secondary | ICD-10-CM

## 2016-07-25 DIAGNOSIS — I4891 Unspecified atrial fibrillation: Secondary | ICD-10-CM | POA: Diagnosis not present

## 2016-07-25 LAB — POCT INR: INR: 2.2

## 2016-08-15 ENCOUNTER — Telehealth: Payer: Self-pay | Admitting: Internal Medicine

## 2016-08-15 NOTE — Telephone Encounter (Signed)
New message      Talk to someone about fluid building up around his pacemaker.  He said this has happened before and we drew the "fluid" off.  Please call

## 2016-08-20 NOTE — Telephone Encounter (Signed)
Spoke with patient who reported that area of concern had all but disappeared. I reviewed with GT- he recommends keeping scheduled follow up. Pt verbalizes understanding and will call if anything worsens.

## 2016-08-20 NOTE — Telephone Encounter (Signed)
LATE ENTRY: 11/2    Spoke with patient who reported that there is a small pencil point sized bump overtop of his device. He stated that it is not at the incision site, and denies redness, swelling pain or heat at the site. He denies fever or chills. Will review with GT on 11/3 and return patient call with updates.

## 2016-08-20 NOTE — Telephone Encounter (Deleted)
Spoke with patient who reported that there is a small pencil point sized bump overtop of his device. He stated that it is not at the incision site, and denies redness, swelling pain or heat at the site. He denies fever or chills. Will review with GT on 11/3 and return patient call with updates.

## 2016-08-30 ENCOUNTER — Encounter: Payer: Self-pay | Admitting: Internal Medicine

## 2016-09-01 ENCOUNTER — Other Ambulatory Visit (HOSPITAL_COMMUNITY): Payer: Self-pay | Admitting: Internal Medicine

## 2016-09-01 DIAGNOSIS — I5022 Chronic systolic (congestive) heart failure: Secondary | ICD-10-CM

## 2016-09-01 DIAGNOSIS — I25119 Atherosclerotic heart disease of native coronary artery with unspecified angina pectoris: Secondary | ICD-10-CM

## 2016-09-04 ENCOUNTER — Telehealth: Payer: Self-pay | Admitting: Internal Medicine

## 2016-09-04 NOTE — Telephone Encounter (Signed)
Returned patient's call. He states Kendrick Fries called him and that she worked in Coumadin. Advised him that we did not call him and that it looks like someone left a voicemail to confirm his appt for next week. He verbalized understanding.

## 2016-09-04 NOTE — Telephone Encounter (Signed)
New message  Pt is returning call to coumadin clinic  Please call back

## 2016-09-10 ENCOUNTER — Ambulatory Visit (INDEPENDENT_AMBULATORY_CARE_PROVIDER_SITE_OTHER): Payer: Medicare Other | Admitting: Internal Medicine

## 2016-09-10 ENCOUNTER — Encounter: Payer: Self-pay | Admitting: Internal Medicine

## 2016-09-10 ENCOUNTER — Ambulatory Visit (INDEPENDENT_AMBULATORY_CARE_PROVIDER_SITE_OTHER): Payer: Medicare Other

## 2016-09-10 VITALS — BP 86/50 | HR 77 | Ht 74.5 in | Wt 234.6 lb

## 2016-09-10 DIAGNOSIS — I480 Paroxysmal atrial fibrillation: Secondary | ICD-10-CM

## 2016-09-10 DIAGNOSIS — Z01812 Encounter for preprocedural laboratory examination: Secondary | ICD-10-CM

## 2016-09-10 DIAGNOSIS — I4891 Unspecified atrial fibrillation: Secondary | ICD-10-CM | POA: Diagnosis not present

## 2016-09-10 DIAGNOSIS — Z950 Presence of cardiac pacemaker: Secondary | ICD-10-CM | POA: Diagnosis not present

## 2016-09-10 DIAGNOSIS — Z7901 Long term (current) use of anticoagulants: Secondary | ICD-10-CM | POA: Diagnosis not present

## 2016-09-10 DIAGNOSIS — Z79899 Other long term (current) drug therapy: Secondary | ICD-10-CM

## 2016-09-10 DIAGNOSIS — I25119 Atherosclerotic heart disease of native coronary artery with unspecified angina pectoris: Secondary | ICD-10-CM

## 2016-09-10 LAB — CBC WITH DIFFERENTIAL/PLATELET
BASOS ABS: 0 {cells}/uL (ref 0–200)
Basophils Relative: 0 %
EOS PCT: 3 %
Eosinophils Absolute: 300 cells/uL (ref 15–500)
HEMATOCRIT: 45.7 % (ref 38.5–50.0)
HEMOGLOBIN: 14.7 g/dL (ref 13.2–17.1)
LYMPHS ABS: 2100 {cells}/uL (ref 850–3900)
Lymphocytes Relative: 21 %
MCH: 32.9 pg (ref 27.0–33.0)
MCHC: 32.2 g/dL (ref 32.0–36.0)
MCV: 102.2 fL — ABNORMAL HIGH (ref 80.0–100.0)
MPV: 9.9 fL (ref 7.5–12.5)
Monocytes Absolute: 1000 cells/uL — ABNORMAL HIGH (ref 200–950)
Monocytes Relative: 10 %
NEUTROS PCT: 66 %
Neutro Abs: 6600 cells/uL (ref 1500–7800)
Platelets: 329 10*3/uL (ref 140–400)
RBC: 4.47 MIL/uL (ref 4.20–5.80)
RDW: 15.5 % — ABNORMAL HIGH (ref 11.0–15.0)
WBC: 10 10*3/uL (ref 3.8–10.8)

## 2016-09-10 LAB — CUP PACEART INCLINIC DEVICE CHECK
Battery Voltage: 2.59 V
Brady Statistic RA Percent Paced: 0.42 %
Date Time Interrogation Session: 20171128113026
Implantable Lead Implant Date: 20060828
Implantable Lead Location: 753858
Lead Channel Impedance Value: 487.5 Ohm
Lead Channel Pacing Threshold Amplitude: 0.75 V
Lead Channel Pacing Threshold Amplitude: 0.75 V
Lead Channel Pacing Threshold Pulse Width: 0.5 ms
Lead Channel Pacing Threshold Pulse Width: 0.7 ms
Lead Channel Sensing Intrinsic Amplitude: 12 mV
Lead Channel Setting Pacing Amplitude: 2 V
Lead Channel Setting Pacing Amplitude: 2 V
Lead Channel Setting Pacing Pulse Width: 0.5 ms
Lead Channel Setting Pacing Pulse Width: 0.7 ms
Lead Channel Setting Sensing Sensitivity: 2.5 mV
MDC IDC LEAD IMPLANT DT: 20060828
MDC IDC LEAD IMPLANT DT: 20110209
MDC IDC LEAD LOCATION: 753859
MDC IDC LEAD LOCATION: 753860
MDC IDC LEAD MODEL: 4194
MDC IDC MSMT LEADCHNL LV IMPEDANCE VALUE: 675 Ohm
MDC IDC MSMT LEADCHNL LV PACING THRESHOLD AMPLITUDE: 0.75 V
MDC IDC MSMT LEADCHNL LV PACING THRESHOLD AMPLITUDE: 0.75 V
MDC IDC MSMT LEADCHNL LV PACING THRESHOLD PULSEWIDTH: 0.7 ms
MDC IDC MSMT LEADCHNL RA SENSING INTR AMPL: 0.8 mV
MDC IDC MSMT LEADCHNL RV IMPEDANCE VALUE: 437.5 Ohm
MDC IDC MSMT LEADCHNL RV PACING THRESHOLD PULSEWIDTH: 0.5 ms
MDC IDC PG IMPLANT DT: 20110209
MDC IDC SET LEADCHNL LV PACING AMPLITUDE: 2.5 V
MDC IDC STAT BRADY RV PERCENT PACED: 98 %
Pulse Gen Model: 3210
Pulse Gen Serial Number: 2428292

## 2016-09-10 LAB — PROTIME-INR
INR: 2.5 — ABNORMAL HIGH
PROTHROMBIN TIME: 25.8 s — AB (ref 9.0–11.5)

## 2016-09-10 LAB — BASIC METABOLIC PANEL
BUN: 27 mg/dL — ABNORMAL HIGH (ref 7–25)
CALCIUM: 9.4 mg/dL (ref 8.6–10.3)
CO2: 25 mmol/L (ref 20–31)
Chloride: 106 mmol/L (ref 98–110)
Creat: 1.62 mg/dL — ABNORMAL HIGH (ref 0.70–1.18)
GLUCOSE: 96 mg/dL (ref 65–99)
Potassium: 5.5 mmol/L — ABNORMAL HIGH (ref 3.5–5.3)
SODIUM: 139 mmol/L (ref 135–146)

## 2016-09-10 LAB — POCT INR: INR: 2.4

## 2016-09-10 MED ORDER — BENAZEPRIL HCL 10 MG PO TABS: 10.0000 mg | ORAL_TABLET | Freq: Every day | ORAL | 6 refills | Status: DC

## 2016-09-10 MED ORDER — CARVEDILOL 12.5 MG PO TABS: 12.5000 mg | ORAL_TABLET | Freq: Two times a day (BID) | ORAL | 6 refills | Status: AC

## 2016-09-10 NOTE — Progress Notes (Signed)
HPI [name] returns today for followup. He is a very pleasant 75 year old man with chronic systolic heart failure, atrial fibrillation, a nonischemic cardiomyopathy, status post biventricular pacemaker insertion. The patient denies medical noncompliance. In the interim he has reached ERI on his device. No Known Allergies   Current Outpatient Prescriptions  Medication Sig Dispense Refill  . acarbose (PRECOSE) 100 MG tablet Take 50 mg by mouth 3 (three) times daily with meals.    Marland Kitchen allopurinol (ZYLOPRIM) 300 MG tablet Take 300 mg by mouth daily.      . benazepril (LOTENSIN) 20 MG tablet Take 1 tablet (20 mg total) by mouth daily. 30 tablet 6  . carvedilol (COREG) 25 MG tablet Take 25 mg by mouth 2 (two) times daily with a meal.    . digoxin (LANOXIN) 0.125 MG tablet Take 0.125 mg by mouth daily.    . ferrous sulfate 325 (65 FE) MG tablet Take 325 mg by mouth daily with breakfast.     . furosemide (LASIX) 80 MG tablet Take 1 tablet (80 mg total) by mouth daily with breakfast. Can take an extra 40 mg as needed for swelling 30 tablet 6  . hydrALAZINE (APRESOLINE) 50 MG tablet Take 50 mg by mouth 3 (three) times daily.    Marland Kitchen HYDROPHILIC EX Apply 1 application topically daily as needed (For dry skin/heels).     . hydroxyurea (HYDREA) 500 MG capsule Take 500 mg by mouth daily. May take with food to minimize GI side effects.    . insulin NPH (HUMULIN N,NOVOLIN N) 100 UNIT/ML injection Inject 10-20 Units into the skin See admin instructions. Take 10 units 30 minutes before last meal and 20 units 1 hour before first meal, 5 units in evening, if BS >110, if BS <110, give 2.5 units    . KLOR-CON M20 20 MEQ tablet TAKE 2 TABLETS (40 MEQ TOTAL) DAILY 180 tablet 6  . sildenafil (VIAGRA) 100 MG tablet TAKE 1 TABLET BY MOUTH EVERY DAY AS NEEDED FOR ERECTILE DYSFUNCTION 10 tablet 2  . simvastatin (ZOCOR) 20 MG tablet Take 20 mg by mouth at bedtime.    Marland Kitchen spironolactone (ALDACTONE) 25 MG tablet Take 1 tablet (25  mg total) by mouth daily. 30 tablet 6  . terazosin (HYTRIN) 2 MG capsule Take 2 mg by mouth at bedtime. For help with urination    . warfarin (COUMADIN) 6 MG tablet TAKE AS DIRECTED BY THE COUMADIN CLINIC 120 tablet 2   No current facility-administered medications for this visit.      Past Medical History:  Diagnosis Date  . Atrial fibrillation (HCC)    Chronic. On coumadin.  . CHF (congestive heart failure) ([place])    secondary to nonischemic cardiomyopathy. EF initially 20-25% up to 50% in 2008. Echo 2/12 EF 20-25% . RHC 3/12: RA 21  RV 81/16/24  PA 81/36 (56) PCWP 33 (v to 43) PA sat 43% 48%.status post Medtronic biventricular ICD placement.(with Sprint Fidelis leads)-explanted--now with [employer].Jude Anthem BivPM   . Coronary artery disease, non-occlusive    Cath 3/12: mLAD 70%, m-dLAD 40%; Dx 60%; mCFX 30-40%; pRCA 30%)  . Diabetes mellitus   . Hyperlipidemia   . Hypertension   . Obesities, morbid ([place])   . Polycythemia    vera requiring phlebotomy and treatment with hydroxyurea on a chronic bais. The patient followed by the [facility] for this.  . Wandering pacemaker    History of wandering atrial pacemaker    ROS:   All systems reviewed and negative  except as noted in the HPI.   Past Surgical History:  Procedure Laterality Date  . CARDIAC CATHETERIZATION    . INSERT / REPLACE / REMOVE PACEMAKER     ICD - St. Jude     No family history on file.   Social History   Social History  . Marital status: Married    Spouse name: N/A  . Number of children: N/A  . Years of education: N/A   Occupational History  . Not on file.   Social History Main Topics  . Smoking status: Former Research scientist (life sciences)  . Smokeless tobacco: Never Used  . Alcohol use No  . Drug use: No  . Sexual activity: Not on file   Other Topics Concern  . Not on file   Social History Narrative   Retired   Married    Tobacco Use - No   Alcohol Use - No     BP (!) 86/50   Pulse 77   Ht 6' 2.5" (1.892 m)   Wt 234  lb 9.6 oz (106.4 kg)   BMI 29.72 kg/m   Physical Exam:  Well appearing 75 year old man, NAD HEENT: Unremarkable Neck:  6 cm JVD, no thyromegally Lungs:  Clear with no wheezes, rhonchi, or increased work of breathing. Bilateral rales are present approximately 1/3 the way up. HEART:  Regular rate rhythm, no murmurs, no rubs, no clicks Abd:  soft, positive bowel sounds, no organomegally, no rebound, no guarding Ext:  2 plus pulses, no cyanosis, no clubbing, 1+ peripheral edema bilaterally Skin:  No rashes no nodules Neuro:  CN II through XII intact, motor grossly intact   DEVICE  Normal device function.  See PaceArt for details.   Assess/Plan:  1. Chronic systolic heart failure - his symptoms are class 2-3 mostly low output. As his BP is low, I have asked him to reduce his dose of lisinopril and coreg. 2. PPM - his BiV PPM is at ERI. Will schedule a gen change next week. 3. Atrial fib - his ventricular rate is well controlled in the setting of CHB. He will continue his warfarin. 4. CAD - he denies anginal symptoms. Will follow.  Mikle Bosworth.D.

## 2016-09-10 NOTE — Patient Instructions (Addendum)
Medication Instructions:  Your physician has recommended you make the following change in your medication:  1) DECREASE Benazepril to 10 mg daily 2) DECREASE Carvedilol to 12.5 mg twice daily    Labwork: TODAY: BMET / CBC w/ diff / INR  Testing/Procedures: None Ordered   Follow-Up: Follow-up for a wound check in the Device Clinic 10-14 days from 09/20/16 and with Dr. Lovena Le 91 days after  09/20/16.   Any Other Special Instructions Will Be Listed Below --- HOLD Coumadin 2 days prior to procedure. Last dose will be 09/17/16.  Please wash with the CHG Soap the night before and morning of procedure (follow instruction page "Preparing For Surgery").   Report to the [facility] on 09/20/16 on 9:30 AM  Nothing to eat or drink after midnight the night before procedure  Do not take any medication morning of procedure  You will need someone to drive you home after the procedure    If you need a refill on your cardiac medications before your next appointment, please call your pharmacy.

## 2016-09-12 ENCOUNTER — Other Ambulatory Visit (HOSPITAL_COMMUNITY): Payer: Self-pay | Admitting: Internal Medicine

## 2016-09-13 ENCOUNTER — Telehealth: Payer: Self-pay

## 2016-09-20 ENCOUNTER — Encounter (HOSPITAL_COMMUNITY)
Admission: RE | Disposition: A | Discharge status: Home / Self Care | Payer: Self-pay | Source: Ambulatory Visit | Attending: Internal Medicine

## 2016-09-20 ENCOUNTER — Ambulatory Visit (HOSPITAL_COMMUNITY)
Admission: RE | Admit: 2016-09-20 | Discharge: 2016-09-20 | Disposition: A | Discharge status: Home / Self Care | Payer: Medicare Other | Source: Ambulatory Visit | Attending: Internal Medicine | Admitting: Internal Medicine

## 2016-09-20 DIAGNOSIS — Z45010 Encounter for checking and testing of cardiac pacemaker pulse generator [battery]: Secondary | ICD-10-CM

## 2016-09-20 DIAGNOSIS — I110 Hypertensive heart disease with heart failure: Secondary | ICD-10-CM | POA: Diagnosis not present

## 2016-09-20 DIAGNOSIS — E785 Hyperlipidemia, unspecified: Secondary | ICD-10-CM | POA: Insufficient documentation

## 2016-09-20 DIAGNOSIS — Z87891 Personal history of nicotine dependence: Secondary | ICD-10-CM | POA: Diagnosis not present

## 2016-09-20 DIAGNOSIS — I5022 Chronic systolic (congestive) heart failure: Secondary | ICD-10-CM | POA: Diagnosis not present

## 2016-09-20 DIAGNOSIS — I442 Atrioventricular block, complete: Secondary | ICD-10-CM | POA: Insufficient documentation

## 2016-09-20 DIAGNOSIS — I429 Cardiomyopathy, unspecified: Secondary | ICD-10-CM | POA: Insufficient documentation

## 2016-09-20 DIAGNOSIS — Z950 Presence of cardiac pacemaker: Secondary | ICD-10-CM | POA: Insufficient documentation

## 2016-09-20 DIAGNOSIS — Z7901 Long term (current) use of anticoagulants: Secondary | ICD-10-CM | POA: Diagnosis not present

## 2016-09-20 DIAGNOSIS — Z794 Long term (current) use of insulin: Secondary | ICD-10-CM | POA: Insufficient documentation

## 2016-09-20 DIAGNOSIS — I2510 Atherosclerotic heart disease of native coronary artery without angina pectoris: Secondary | ICD-10-CM | POA: Insufficient documentation

## 2016-09-20 DIAGNOSIS — E119 Type 2 diabetes mellitus without complications: Secondary | ICD-10-CM | POA: Diagnosis not present

## 2016-09-20 DIAGNOSIS — I4891 Unspecified atrial fibrillation: Secondary | ICD-10-CM | POA: Insufficient documentation

## 2016-09-20 HISTORY — PX: EP IMPLANTABLE DEVICE: SHX172B

## 2016-09-20 LAB — PROTIME-INR
INR: 1.65
Prothrombin Time: 19.7 seconds — ABNORMAL HIGH (ref 11.4–15.2)

## 2016-09-20 LAB — SURGICAL PCR SCREEN
MRSA, PCR: NEGATIVE
Staphylococcus aureus: NEGATIVE

## 2016-09-20 LAB — GLUCOSE, CAPILLARY: Glucose-Capillary: 105 mg/dL — ABNORMAL HIGH (ref 65–99)

## 2016-09-20 SURGERY — BIV PACEMAKER GENERATOR CHANGEOUT
Anesthesia: LOCAL

## 2016-09-20 MED ORDER — CEFAZOLIN SODIUM-DEXTROSE 2-4 GM/100ML-% IV SOLN
INTRAVENOUS | Status: AC
  Filled 2016-09-20: qty 100

## 2016-09-20 MED ORDER — CHLORHEXIDINE GLUCONATE 4 % EX LIQD: 60.0000 mL | Freq: Once | CUTANEOUS | Status: DC

## 2016-09-20 MED ORDER — MUPIROCIN 2 % EX OINT
TOPICAL_OINTMENT | CUTANEOUS | Status: AC
  Administered 2016-09-20: 1 via TOPICAL
  Filled 2016-09-20: qty 22

## 2016-09-20 MED ORDER — HYDRALAZINE HCL 20 MG/ML IJ SOLN
INTRAMUSCULAR | Status: DC | PRN
  Administered 2016-09-20: 10 mg via INTRAVENOUS

## 2016-09-20 MED ORDER — LIDOCAINE HCL (PF) 1 % IJ SOLN
INTRAMUSCULAR | Status: DC | PRN
  Administered 2016-09-20: 50 mL

## 2016-09-20 MED ORDER — ONDANSETRON HCL 4 MG/2ML IJ SOLN: 4.0000 mg | Freq: Four times a day (QID) | INTRAMUSCULAR | Status: DC | PRN

## 2016-09-20 MED ORDER — HYDRALAZINE HCL 20 MG/ML IJ SOLN
INTRAMUSCULAR | Status: AC
  Filled 2016-09-20: qty 1

## 2016-09-20 MED ORDER — MUPIROCIN 2 % EX OINT
1.0000 "application " | TOPICAL_OINTMENT | Freq: Once | CUTANEOUS | Status: AC
  Administered 2016-09-20: 1 via TOPICAL
  Filled 2016-09-20: qty 22

## 2016-09-20 MED ORDER — LIDOCAINE HCL (PF) 1 % IJ SOLN
INTRAMUSCULAR | Status: AC
  Filled 2016-09-20: qty 60

## 2016-09-20 MED ORDER — SODIUM CHLORIDE 0.9 % IV SOLN
INTRAVENOUS | Status: DC
  Administered 2016-09-20: 10:00:00 via INTRAVENOUS

## 2016-09-20 MED ORDER — GENTAMICIN SULFATE 40 MG/ML IJ SOLN
80.0000 mg | INTRAMUSCULAR | Status: AC
  Administered 2016-09-20: 80 mg
  Filled 2016-09-20: qty 2

## 2016-09-20 MED ORDER — CEFAZOLIN SODIUM-DEXTROSE 2-4 GM/100ML-% IV SOLN
2.0000 g | INTRAVENOUS | Status: AC
  Administered 2016-09-20: 2 g via INTRAVENOUS

## 2016-09-20 MED ORDER — SODIUM CHLORIDE 0.9 % IR SOLN
Status: AC
  Filled 2016-09-20: qty 2

## 2016-09-20 MED ORDER — ACETAMINOPHEN 325 MG PO TABS: 325.0000 mg | ORAL_TABLET | ORAL | Status: DC | PRN

## 2016-09-20 SURGICAL SUPPLY — 5 items
CABLE SURGICAL S-101-97-12 (CABLE) ×1 IMPLANT
PACEMAKER ALLR CRT-P RF PM3222 (Pacemaker) IMPLANT
PAD DEFIB LIFELINK (PAD) ×1 IMPLANT
PPM ALLURE CRT-P RF PM3222 (Pacemaker) ×2 IMPLANT
TRAY PACEMAKER INSERTION (PACKS) ×1 IMPLANT

## 2016-09-20 NOTE — H&P (View-Only) (Signed)
HPI [name] returns today for followup. He is a very pleasant 75 year old man with chronic systolic heart failure, atrial fibrillation, a nonischemic cardiomyopathy, status post biventricular pacemaker insertion. The patient denies medical noncompliance. In the interim he has reached ERI on his device. No Known Allergies   Current Outpatient Prescriptions  Medication Sig Dispense Refill  . acarbose (PRECOSE) 100 MG tablet Take 50 mg by mouth 3 (three) times daily with meals.    Marland Kitchen allopurinol (ZYLOPRIM) 300 MG tablet Take 300 mg by mouth daily.      . benazepril (LOTENSIN) 20 MG tablet Take 1 tablet (20 mg total) by mouth daily. 30 tablet 6  . carvedilol (COREG) 25 MG tablet Take 25 mg by mouth 2 (two) times daily with a meal.    . digoxin (LANOXIN) 0.125 MG tablet Take 0.125 mg by mouth daily.    . ferrous sulfate 325 (65 FE) MG tablet Take 325 mg by mouth daily with breakfast.     . furosemide (LASIX) 80 MG tablet Take 1 tablet (80 mg total) by mouth daily with breakfast. Can take an extra 40 mg as needed for swelling 30 tablet 6  . hydrALAZINE (APRESOLINE) 50 MG tablet Take 50 mg by mouth 3 (three) times daily.    Marland Kitchen HYDROPHILIC EX Apply 1 application topically daily as needed (For dry skin/heels).     . hydroxyurea (HYDREA) 500 MG capsule Take 500 mg by mouth daily. May take with food to minimize GI side effects.    . insulin NPH (HUMULIN N,NOVOLIN N) 100 UNIT/ML injection Inject 10-20 Units into the skin See admin instructions. Take 10 units 30 minutes before last meal and 20 units 1 hour before first meal, 5 units in evening, if BS >110, if BS <110, give 2.5 units    . KLOR-CON M20 20 MEQ tablet TAKE 2 TABLETS (40 MEQ TOTAL) DAILY 180 tablet 6  . sildenafil (VIAGRA) 100 MG tablet TAKE 1 TABLET BY MOUTH EVERY DAY AS NEEDED FOR ERECTILE DYSFUNCTION 10 tablet 2  . simvastatin (ZOCOR) 20 MG tablet Take 20 mg by mouth at bedtime.    Marland Kitchen spironolactone (ALDACTONE) 25 MG tablet Take 1 tablet (25  mg total) by mouth daily. 30 tablet 6  . terazosin (HYTRIN) 2 MG capsule Take 2 mg by mouth at bedtime. For help with urination    . warfarin (COUMADIN) 6 MG tablet TAKE AS DIRECTED BY THE COUMADIN CLINIC 120 tablet 2   No current facility-administered medications for this visit.      Past Medical History:  Diagnosis Date  . Atrial fibrillation (HCC)    Chronic. On coumadin.  . CHF (congestive heart failure) ([place])    secondary to nonischemic cardiomyopathy. EF initially 20-25% up to 50% in 2008. Echo 2/12 EF 20-25% . RHC 3/12: RA 21  RV 81/16/24  PA 81/36 (56) PCWP 33 (v to 43) PA sat 43% 48%.status post Medtronic biventricular ICD placement.(with Sprint Fidelis leads)-explanted--now with [employer].Jude Anthem BivPM   . Coronary artery disease, non-occlusive    Cath 3/12: mLAD 70%, m-dLAD 40%; Dx 60%; mCFX 30-40%; pRCA 30%)  . Diabetes mellitus   . Hyperlipidemia   . Hypertension   . Obesities, morbid ([place])   . Polycythemia    vera requiring phlebotomy and treatment with hydroxyurea on a chronic bais. The patient followed by the [facility] for this.  . Wandering pacemaker    History of wandering atrial pacemaker    ROS:   All systems reviewed and negative  except as noted in the HPI.   Past Surgical History:  Procedure Laterality Date  . CARDIAC CATHETERIZATION    . INSERT / REPLACE / REMOVE PACEMAKER     ICD - St. Jude     No family history on file.   Social History   Social History  . Marital status: Married    Spouse name: N/A  . Number of children: N/A  . Years of education: N/A   Occupational History  . Not on file.   Social History Main Topics  . Smoking status: Former Research scientist (life sciences)  . Smokeless tobacco: Never Used  . Alcohol use No  . Drug use: No  . Sexual activity: Not on file   Other Topics Concern  . Not on file   Social History Narrative   Retired   Married    Tobacco Use - No   Alcohol Use - No     BP (!) 86/50   Pulse 77   Ht 6' 2.5" (1.892 m)   Wt 234  lb 9.6 oz (106.4 kg)   BMI 29.72 kg/m   Physical Exam:  Well appearing 75 year old man, NAD HEENT: Unremarkable Neck:  6 cm JVD, no thyromegally Lungs:  Clear with no wheezes, rhonchi, or increased work of breathing. Bilateral rales are present approximately 1/3 the way up. HEART:  Regular rate rhythm, no murmurs, no rubs, no clicks Abd:  soft, positive bowel sounds, no organomegally, no rebound, no guarding Ext:  2 plus pulses, no cyanosis, no clubbing, 1+ peripheral edema bilaterally Skin:  No rashes no nodules Neuro:  CN II through XII intact, motor grossly intact   DEVICE  Normal device function.  See PaceArt for details.   Assess/Plan:  1. Chronic systolic heart failure - his symptoms are class 2-3 mostly low output. As his BP is low, I have asked him to reduce his dose of lisinopril and coreg. 2. PPM - his BiV PPM is at ERI. Will schedule a gen change next week. 3. Atrial fib - his ventricular rate is well controlled in the setting of CHB. He will continue his warfarin. 4. CAD - he denies anginal symptoms. Will follow.  Mikle Bosworth.D.

## 2016-09-20 NOTE — Discharge Instructions (Signed)
Pacemaker Battery Change, Care After °Refer to this sheet in the next few weeks. These instructions provide you with information on caring for yourself after your procedure. Your health care provider may also give you more specific instructions. Your treatment has been planned according to current medical practices, but problems sometimes occur. Call your health care provider if you have any problems or questions after your procedure. °WHAT TO EXPECT AFTER THE PROCEDURE °After your procedure, it is typical to have the following sensations: °· Soreness at the pacemaker site. °HOME CARE INSTRUCTIONS  °· Keep the incision clean and dry. °· Unless advised otherwise, you may shower beginning 48 hours after your procedure. °· For the first week after the replacement, avoid stretching motions that pull at the incision site, and avoid heavy exercise with the arm that is on the same side as the incision. °· Take medicines only as directed by your health care provider. °· Keep all follow-up visits as directed by your health care provider. °SEEK MEDICAL CARE IF:  °· You have pain at the incision site that is not relieved by over-the-counter or prescription medicine. °· There is drainage or pus from the incision site. °· There is swelling larger than a lime at the incision site. °· You develop red streaking that extends above or below the incision site. °· You feel brief, intermittent palpitations, light-headedness, or any symptoms that you feel might be related to your heart. °SEEK IMMEDIATE MEDICAL CARE IF:  °· You experience chest pain that is different than the pain at the pacemaker site. °· You experience shortness of breath. °· You have palpitations or irregular heartbeat. °· You have light-headedness that does not go away quickly. °· You faint. °· You have pain that gets worse and is not relieved by medicine. °This information is not intended to replace advice given to you by your health care provider. Make sure you  discuss any questions you have with your health care provider. °Document Released: 07/21/2013 Document Revised: 10/21/2014 Document Reviewed: 07/21/2013 °Elsevier Interactive Patient Education © 2017 [employer] ° °

## 2016-09-20 NOTE — Interval H&P Note (Signed)
History and Physical Interval Note:  09/20/2016 11:45 AM  [name]  has presented today for surgery, with the diagnosis of ERI  The various methods of treatment have been discussed with the patient and family. After consideration of risks, benefits and other options for treatment, the patient has consented to  Procedure(s): BiV Pacemaker Generator Changeout (N/A) as a surgical intervention .  The patient's history has been reviewed, patient examined, no change in status, stable for surgery.  I have reviewed the patient's chart and labs.  Questions were answered to the patient's satisfaction.     Cristopher Peru

## 2016-09-20 NOTE — Interval H&P Note (Signed)
History and Physical Interval Note:  09/20/2016 10:26 AM  [name]  has presented today for surgery, with the diagnosis of ERI  The various methods of treatment have been discussed with the patient and family. After consideration of risks, benefits and other options for treatment, the patient has consented to  Procedure(s): BiV Pacemaker Generator Changeout (N/A) as a surgical intervention .  The patient's history has been reviewed, patient examined, no change in status, stable for surgery.  I have reviewed the patient's chart and labs.  Questions were answered to the patient's satisfaction.     [name]

## 2016-09-23 ENCOUNTER — Encounter (HOSPITAL_COMMUNITY): Payer: Self-pay | Admitting: Internal Medicine

## 2016-09-23 MED FILL — Cefazolin Sodium-Dextrose IV Solution 2 GM/100ML-4%: INTRAVENOUS | Qty: 100 | Status: AC

## 2016-10-02 ENCOUNTER — Ambulatory Visit (INDEPENDENT_AMBULATORY_CARE_PROVIDER_SITE_OTHER): Payer: Medicare Other | Admitting: *Deleted

## 2016-10-02 DIAGNOSIS — I5022 Chronic systolic (congestive) heart failure: Secondary | ICD-10-CM

## 2016-10-02 DIAGNOSIS — Z950 Presence of cardiac pacemaker: Secondary | ICD-10-CM

## 2016-10-02 LAB — CUP PACEART INCLINIC DEVICE CHECK
Battery Voltage: 3.04 V
Brady Statistic RA Percent Paced: 0 %
Brady Statistic RV Percent Paced: 99 %
Implantable Lead Implant Date: 20060828
Implantable Lead Implant Date: 20110209
Implantable Lead Location: 753858
Implantable Lead Location: 753859
Implantable Lead Model: 4194
Implantable Pulse Generator Implant Date: 20171208
Lead Channel Impedance Value: 437.5 Ohm
Lead Channel Impedance Value: 600 Ohm
Lead Channel Pacing Threshold Amplitude: 0.75 V
Lead Channel Pacing Threshold Pulse Width: 0.5 ms
Lead Channel Pacing Threshold Pulse Width: 0.7 ms
Lead Channel Setting Pacing Amplitude: 2 V
Lead Channel Setting Pacing Pulse Width: 0.5 ms
Lead Channel Setting Pacing Pulse Width: 0.7 ms
Lead Channel Setting Sensing Sensitivity: 4 mV
MDC IDC LEAD IMPLANT DT: 20060828
MDC IDC LEAD LOCATION: 753860
MDC IDC MSMT LEADCHNL RV PACING THRESHOLD AMPLITUDE: 0.625 V
MDC IDC PG SERIAL: 7953752
MDC IDC SESS DTM: 20171220160537
MDC IDC SET LEADCHNL LV PACING AMPLITUDE: 2.5 V

## 2016-10-02 NOTE — Progress Notes (Signed)
Wound check appointment. Steri-strips removed. Wound without redness or edema. Incision edges approximated, wound well healed. Normal device function. Thresholds, sensing, and impedances consistent with implant measurements. Device programmed at chronic outputs (auto capture on in RV). Histogram distribution appropriate for patient and level of activity. No high ventricular rates noted. Patient educated about wound care, arm mobility, lifting restrictions. ROV with GT on 01/01/17.  Scabbed abrasion noted over sternal area, likely related to adhesive "patch" use during procedure per patient. Gave antibiotic ointment to patient, instructed to have PCP look at site during appointment on 12/26 if not fully healed by that time. Patient verbalizes understanding and denies additional questions or concerns at this time.

## 2016-10-08 NOTE — Telephone Encounter (Signed)
Error

## 2016-10-22 ENCOUNTER — Ambulatory Visit (INDEPENDENT_AMBULATORY_CARE_PROVIDER_SITE_OTHER): Payer: Medicare Other

## 2016-10-22 DIAGNOSIS — Z7901 Long term (current) use of anticoagulants: Secondary | ICD-10-CM

## 2016-10-22 DIAGNOSIS — I4891 Unspecified atrial fibrillation: Secondary | ICD-10-CM

## 2016-10-22 LAB — POCT INR: INR: 2.8

## 2016-10-30 ENCOUNTER — Encounter (HOSPITAL_COMMUNITY): Payer: Medicare Other | Admitting: Internal Medicine

## 2016-11-25 ENCOUNTER — Telehealth (HOSPITAL_COMMUNITY): Payer: Self-pay | Admitting: *Deleted

## 2016-11-25 ENCOUNTER — Encounter (HOSPITAL_COMMUNITY): Payer: Self-pay | Admitting: Internal Medicine

## 2016-11-25 ENCOUNTER — Ambulatory Visit (HOSPITAL_COMMUNITY)
Admission: RE | Admit: 2016-11-25 | Discharge: 2016-11-25 | Disposition: A | Discharge status: Home / Self Care | Payer: Medicare Other | Source: Ambulatory Visit | Attending: Internal Medicine | Admitting: Internal Medicine

## 2016-11-25 VITALS — BP 104/60 | HR 75 | Wt 238.8 lb

## 2016-11-25 DIAGNOSIS — Z950 Presence of cardiac pacemaker: Secondary | ICD-10-CM | POA: Insufficient documentation

## 2016-11-25 DIAGNOSIS — Z9119 Patient's noncompliance with other medical treatment and regimen: Secondary | ICD-10-CM | POA: Diagnosis not present

## 2016-11-25 DIAGNOSIS — I2510 Atherosclerotic heart disease of native coronary artery without angina pectoris: Secondary | ICD-10-CM | POA: Insufficient documentation

## 2016-11-25 DIAGNOSIS — E875 Hyperkalemia: Secondary | ICD-10-CM

## 2016-11-25 DIAGNOSIS — E119 Type 2 diabetes mellitus without complications: Secondary | ICD-10-CM | POA: Diagnosis not present

## 2016-11-25 DIAGNOSIS — Z79899 Other long term (current) drug therapy: Secondary | ICD-10-CM | POA: Diagnosis not present

## 2016-11-25 DIAGNOSIS — Z794 Long term (current) use of insulin: Secondary | ICD-10-CM | POA: Insufficient documentation

## 2016-11-25 DIAGNOSIS — Z7901 Long term (current) use of anticoagulants: Secondary | ICD-10-CM | POA: Diagnosis not present

## 2016-11-25 DIAGNOSIS — I110 Hypertensive heart disease with heart failure: Secondary | ICD-10-CM | POA: Diagnosis not present

## 2016-11-25 DIAGNOSIS — I482 Chronic atrial fibrillation, unspecified: Secondary | ICD-10-CM

## 2016-11-25 DIAGNOSIS — E669 Obesity, unspecified: Secondary | ICD-10-CM | POA: Diagnosis not present

## 2016-11-25 DIAGNOSIS — I428 Other cardiomyopathies: Secondary | ICD-10-CM | POA: Insufficient documentation

## 2016-11-25 DIAGNOSIS — I5022 Chronic systolic (congestive) heart failure: Secondary | ICD-10-CM | POA: Diagnosis not present

## 2016-11-25 DIAGNOSIS — Z720 Tobacco use: Secondary | ICD-10-CM | POA: Diagnosis not present

## 2016-11-25 LAB — BASIC METABOLIC PANEL
ANION GAP: 5 (ref 5–15)
Anion gap: 3 — ABNORMAL LOW (ref 5–15)
BUN: 26 mg/dL — AB (ref 6–20)
BUN: 25 mg/dL — AB (ref 6–20)
CALCIUM: 9.1 mg/dL (ref 8.9–10.3)
CALCIUM: 9.1 mg/dL (ref 8.9–10.3)
CHLORIDE: 108 mmol/L (ref 101–111)
CO2: 26 mmol/L (ref 22–32)
CO2: 25 mmol/L (ref 22–32)
CREATININE: 1.84 mg/dL — AB (ref 0.61–1.24)
Chloride: 105 mmol/L (ref 101–111)
Creatinine, Ser: 1.77 mg/dL — ABNORMAL HIGH (ref 0.61–1.24)
GFR calc Af Amer: 39 mL/min — ABNORMAL LOW (ref >60)
GFR calc non Af Amer: 36 mL/min — ABNORMAL LOW (ref >60)
GFR, EST AFRICAN AMERICAN: 41 mL/min — AB (ref >60)
GFR, EST NON AFRICAN AMERICAN: 34 mL/min — AB (ref >60)
GLUCOSE: 158 mg/dL — AB (ref 65–99)
GLUCOSE: 111 mg/dL — AB (ref 65–99)
Potassium: 6.4 mmol/L (ref 3.5–5.1)
Potassium: 6 mmol/L — ABNORMAL HIGH (ref 3.5–5.1)
Sodium: 136 mmol/L (ref 135–145)
Sodium: 136 mmol/L (ref 135–145)

## 2016-11-25 LAB — DIGOXIN LEVEL: Digoxin Level: 0.8 ng/mL (ref 0.8–2.0)

## 2016-11-25 NOTE — Addendum Note (Signed)
Encounter addended by: Harvie Junior, CMA on: 11/25/2016 12:09 PM<BR>    Actions taken: Order list changed, Diagnosis association updated

## 2016-11-25 NOTE — Patient Instructions (Signed)
Labs today  We will contact you in 1 year to schedule your next appointment.  

## 2016-11-25 NOTE — Telephone Encounter (Signed)
Received call from Lab, [name]'s K was 6.4 with slight hemolysis, per Dr Haroldine Laws needs recheck asap.  Spoke w/[name], he is aware and will have his wife bring him back for repeat lab.

## 2016-11-25 NOTE — Progress Notes (Signed)
ADVANCED HF CLINIC NOTE  Patient ID: [name], male   DOB: 10/02/1941, 76 y.o.   MRN: [id number]  PCP: Dr Arletha Grippe at [facility] in [place] Primary Cardiologist: Dr Tempie Hoist EP: Dr Lovena Le  HPI:  [name] is a 76 y.o. male with a h/o moderate nonobstructive CAD, nonischemic cardiomyopathy with CHF, HTN, atrial arrythmias (now chronic AF) and obesity. He initially had an EF of 20-25% and single chamber ICD was implanted. EF then recovered to 50% (echo 2008) and had ICD explanted and BiV pacer placed due to bradycardia. Had routine follow-up echo in 08/09/10 and showed EF back down to 25-30% which worsened again on Echo 12/06/10 EF 20-25% despite increase in HF medications. Patient underwent R&L cath on 12/21/10, full results as below. Echo 07/2014 EF 30-35%   Admitted 09/19/15 with volume overload after long period of non-compliance with HF follow up.  Hospital course was relatively uncomplicated with diuresis on IV lasix. Overall diuresed > 12 L and down 28 lbs. Discharge weight 267 lbs. Echo 09/20/15 with EF 20-25% with Grade 3 DD  Underwent generator change for ERI in 11/17.  He reports today for yearly f/u. Refused Entresto at last visit Feels very good. Cotinues to lose weight. Weight down to 238. Says he uses walker occasionally but gets around by holding onto the walls and furniture. No dyspnea, orthopnea or PND. Taking lasix 80 mg daily never takes extra. No CP or palpitations. No lightheadedness or dizziness.  Watching fluid and salt intake. No bleeding on warfarin.  R/LHC 12/21/10 1) Mod nonobs CAD (mLAD 70%, m-dLAD 40%; oDx 60%; mCFX 30-40%; pRCA 30%)  2) RA 21 RV 81/16/24 PA 81/36 (56) PCWP 33 (v to 43) PA sat 43% 48%, Fick 4.2/1.6   Past Medical History:  Diagnosis Date  . Atrial fibrillation (HCC)    Chronic. On coumadin.  . CHF (congestive heart failure) ([place])    secondary to nonischemic cardiomyopathy. EF initially 20-25% up to 50% in 2008. Echo 2/12 EF 20-25% .  RHC 3/12: RA 21  RV 81/16/24  PA 81/36 (56) PCWP 33 (v to 43) PA sat 43% 48%.status post Medtronic biventricular ICD placement.(with Sprint Fidelis leads)-explanted--now with St.Jude Anthem BivPM   . Coronary artery disease, non-occlusive    Cath 3/12: mLAD 70%, m-dLAD 40%; Dx 60%; mCFX 30-40%; pRCA 30%)  . Diabetes mellitus   . Hyperlipidemia   . Hypertension   . Obesities, morbid ([place])   . Polycythemia    vera requiring phlebotomy and treatment with hydroxyurea on a chronic bais. The patient followed by the [facility] for this.  . Wandering pacemaker    History of wandering atrial pacemaker    Current Outpatient Prescriptions  Medication Sig Dispense Refill  . acarbose (PRECOSE) 100 MG tablet Take 50 mg by mouth 3 (three) times daily with meals.    Marland Kitchen allopurinol (ZYLOPRIM) 300 MG tablet Take 300 mg by mouth daily.      . benazepril (LOTENSIN) 10 MG tablet Take 1 tablet (10 mg total) by mouth daily. 30 tablet 6  . carvedilol (COREG) 12.5 MG tablet Take 1 tablet (12.5 mg total) by mouth 2 (two) times daily. 30 tablet 6  . digoxin (LANOXIN) 0.125 MG tablet Take 0.125 mg by mouth daily.    . ferrous sulfate 325 (65 FE) MG tablet Take 325 mg by mouth daily with breakfast.     . furosemide (LASIX) 80 MG tablet Take 1 tablet (80 mg total) by mouth daily with breakfast. Can  take an extra 40 mg as needed for swelling 30 tablet 6  . hydrALAZINE (APRESOLINE) 50 MG tablet Take 50 mg by mouth 3 (three) times daily.    Marland Kitchen HYDROPHILIC EX Apply 1 application topically daily as needed (For dry skin/heels).     . hydroxyurea (HYDREA) 500 MG capsule Take 500 mg by mouth daily. May take with food to minimize GI side effects.    . insulin NPH (HUMULIN N,NOVOLIN N) 100 UNIT/ML injection Inject 14 Units into the skin daily before breakfast.     . KLOR-CON M20 20 MEQ tablet TAKE 2 TABLETS (40 MEQ TOTAL) DAILY 180 tablet 6  . simvastatin (ZOCOR) 20 MG tablet Take 20 mg by mouth at bedtime.    Marland Kitchen spironolactone  (ALDACTONE) 25 MG tablet Take 1 tablet (25 mg total) by mouth daily. 30 tablet 6  . terazosin (HYTRIN) 2 MG capsule Take 2 mg by mouth at bedtime. For help with urination    . warfarin (COUMADIN) 6 MG tablet TAKE AS DIRECTED BY THE COUMADIN CLINIC (Patient taking differently: TAKE AS DIRECTED BY THE COUMADIN CLINIC TAKING 1 TABLET DAILY) 120 tablet 2  . sildenafil (VIAGRA) 100 MG tablet TAKE 1 TABLET BY MOUTH EVERY DAY AS NEEDED FOR ERECTILE DYSFUNCTION (Patient not taking: Reported on 11/25/2016) 10 tablet 2   No current facility-administered medications for this encounter.     No Known Allergies    Social History   Social History  . Marital status: Married    Spouse name: N/A  . Number of children: N/A  . Years of education: N/A   Occupational History  . Not on file.   Social History Main Topics  . Smoking status: Former Research scientist (life sciences)  . Smokeless tobacco: Never Used  . Alcohol use No  . Drug use: No  . Sexual activity: Not on file   Other Topics Concern  . Not on file   Social History Narrative   Retired   Married    Tobacco Use - No   Alcohol Use - No     No family history on file.  Vitals:   11/25/16 0950  BP: 104/60  Pulse: 75  SpO2: 100%  Weight: 238 lb 12 oz (108.3 kg)    PHYSICAL EXAM: General: Elderly appearing, no resp distress. Walks with walker in clinic Psych: Normal affect. HEENT: Normal Neck: Supple, no bruits. JVP 5 cm Carotids 2+. No thyromegaly or nodule noted.Marland Kitchen  Heart: PMI laterally displaced. RRR no s3, s4, or murmurs noted. Lungs: Clear Abdomen: Soft, non-tender, non-distended, No HSM, BS + x 4.  Extremities: No clubbing, cyanosis.  No edema.  Neuro: Alert and oriented X 3. Moves all extremities spontaneously. Normal pedal pulses   ASSESSMENT & PLAN:  1. Chronic systolic CHF: Nonischemic cardiomyopathy. Echo 09/20/15 EF 20-25% with Grade 3 DD s/p CRT-D -NYHA II-III but not very active. Volume status looks ok.  - Continue Coreg,  benazepril and hydralazine  No nitrates with ongoing viagra use.  - Continue spironolactone to 25 mg daily. - Continues to refuse Entresto due to cost. Ask him to discuss it with [place] [facility] doctor. I wrote it down for him to discuss witht hem  - Encouraged to remain compliant with HF follow up. Encouraged to call HF clinic if weight goes up 3 lbs overnight or 5 lbs in a week.  2. CAD:  - Moderate nonobstructive CAD in past.No ischemic symptoms. - Continue statin, No ASA warfarin use and stable CAD. - PCP following lipids. Goal  LDL < 70.  3. Atrial fibrillation: Chronic.Continue warfarin. Follows at coumadin clinic 4. DM-2 - Per primary 5. HTN - well controlled  6. Tobacco use  --encouraged to quit smoking.   Check labs today including BMET and digoxin.  Bensimhon, Daniel,MD 10:18 AM

## 2016-11-25 NOTE — Addendum Note (Signed)
Encounter addended by: Scarlette Calico, RN on: 11/25/2016 10:29 AM<BR>    Actions taken: Order list changed, Diagnosis association updated, Sign clinical note

## 2016-11-25 NOTE — Telephone Encounter (Signed)
Notes Recorded by Scarlette Calico, RN on 11/25/2016 at 5:12 PM EST Per Dr Vaughan Browner, stop KCL hold [name] for 3 days then resume, recheck bmet in 1 week. Pt and wife both aware, agreeable and verbalizes understanding, will have repeat lab at [facility] 2/19

## 2016-12-02 ENCOUNTER — Other Ambulatory Visit: Payer: Medicare Other | Admitting: *Deleted

## 2016-12-02 ENCOUNTER — Ambulatory Visit (INDEPENDENT_AMBULATORY_CARE_PROVIDER_SITE_OTHER): Payer: Medicare Other | Admitting: *Deleted

## 2016-12-02 DIAGNOSIS — I10 Essential (primary) hypertension: Secondary | ICD-10-CM | POA: Diagnosis not present

## 2016-12-02 DIAGNOSIS — I4891 Unspecified atrial fibrillation: Secondary | ICD-10-CM | POA: Diagnosis not present

## 2016-12-02 DIAGNOSIS — I2510 Atherosclerotic heart disease of native coronary artery without angina pectoris: Secondary | ICD-10-CM

## 2016-12-02 DIAGNOSIS — Z7901 Long term (current) use of anticoagulants: Secondary | ICD-10-CM | POA: Diagnosis not present

## 2016-12-02 LAB — BASIC METABOLIC PANEL
BUN/Creatinine Ratio: 15 (ref 10–24)
BUN: 25 mg/dL (ref 8–27)
CO2: 20 mmol/L (ref 18–29)
Calcium: 8.3 mg/dL — ABNORMAL LOW (ref 8.6–10.2)
Chloride: 99 mmol/L (ref 96–106)
Creatinine, Ser: 1.68 mg/dL — ABNORMAL HIGH (ref 0.76–1.27)
GFR, EST AFRICAN AMERICAN: 45 mL/min/{1.73_m2} — AB (ref >59)
GFR, EST NON AFRICAN AMERICAN: 39 mL/min/{1.73_m2} — AB (ref >59)
Glucose: 161 mg/dL — ABNORMAL HIGH (ref 65–99)
POTASSIUM: 4.9 mmol/L (ref 3.5–5.2)
SODIUM: 138 mmol/L (ref 134–144)

## 2016-12-02 LAB — POCT INR: INR: 2.5

## 2016-12-02 NOTE — Addendum Note (Signed)
Addended by: Eulis Foster on: 12/02/2016 09:31 AM   Modules accepted: Orders

## 2016-12-17 ENCOUNTER — Telehealth (HOSPITAL_COMMUNITY): Payer: Self-pay | Admitting: *Deleted

## 2016-12-17 ENCOUNTER — Telehealth: Payer: Self-pay | Admitting: Internal Medicine

## 2016-12-17 NOTE — Telephone Encounter (Signed)
Spoke with pts wife and informed her that there would need to be a clearance form faxed and a release of medical information faxed from the MD/Facility where pt was having surgery before records could be released. Pts wife voiced understanding.

## 2016-12-17 NOTE — Telephone Encounter (Signed)
Patient's wife called saying that pt was going to have Eye surgery next week and the [facility] hospital is requesting for Dr. Clayborne Dana last 3 office notes and patient's last stress test results faxed to them.    Requested records faxed to Myrlene Broker, [place] @ [phone] as requested.

## 2016-12-17 NOTE — Telephone Encounter (Signed)
Patient is calling because he is scheduled to have a surgery and the Pa, Shaune Spittle is requesting that we fax over his recent pacemaker readings to [phone]. Patient would like a follow up call. Thanks.

## 2016-12-18 NOTE — Telephone Encounter (Signed)
When faxing the information it continued to say "No answer." I have attempted 4 times and I called patient's wife back to let her know and she requested I mail the information needed and she will hand deliver to their office.  Records mail today to patient's home address.

## 2016-12-20 ENCOUNTER — Telehealth (HOSPITAL_COMMUNITY): Payer: Self-pay | Admitting: *Deleted

## 2016-12-20 ENCOUNTER — Telehealth: Payer: Self-pay | Admitting: Internal Medicine

## 2016-12-20 NOTE — Telephone Encounter (Signed)
Pt's wife called saying she still hasn't received the records that I mailed to her earlier this week.  She asked me to try and fax them one more time, otherwise she will just come pick them up at our office this afternoon.  I have faxed them as requested for the 6th time to [phone].

## 2016-12-20 NOTE — Telephone Encounter (Signed)
Please see note.

## 2016-12-20 NOTE — Telephone Encounter (Signed)
[name] is calling to f/u..she requested [facility] records [facility] to [facility], please call to discuss. Thanks.

## 2016-12-20 NOTE — Telephone Encounter (Signed)
Spoke with patient he was asking for Hospital Records to be faxed about Battery Change on Pacemaker. I made him aware I cannot release hospital records provided him with [facility] records number.

## 2017-01-01 ENCOUNTER — Ambulatory Visit (INDEPENDENT_AMBULATORY_CARE_PROVIDER_SITE_OTHER): Payer: Medicare Other | Admitting: Internal Medicine

## 2017-01-01 ENCOUNTER — Encounter: Payer: Self-pay | Admitting: Internal Medicine

## 2017-01-01 VITALS — BP 128/86 | HR 80 | Ht 73.0 in | Wt 248.6 lb

## 2017-01-01 DIAGNOSIS — I482 Chronic atrial fibrillation, unspecified: Secondary | ICD-10-CM

## 2017-01-01 DIAGNOSIS — I2510 Atherosclerotic heart disease of native coronary artery without angina pectoris: Secondary | ICD-10-CM

## 2017-01-01 DIAGNOSIS — I5022 Chronic systolic (congestive) heart failure: Secondary | ICD-10-CM

## 2017-01-01 LAB — CUP PACEART INCLINIC DEVICE CHECK
Battery Voltage: 2.99 V
Brady Statistic RA Percent Paced: 0 %
Brady Statistic RV Percent Paced: 98 %
Implantable Lead Implant Date: 20060828
Implantable Lead Implant Date: 20060828
Implantable Lead Location: 753859
Implantable Lead Location: 753860
Implantable Lead Model: 4194
Lead Channel Impedance Value: 450 Ohm
Lead Channel Impedance Value: 600 Ohm
Lead Channel Pacing Threshold Amplitude: 0.75 V
Lead Channel Pacing Threshold Amplitude: 0.75 V
Lead Channel Pacing Threshold Pulse Width: 0.5 ms
Lead Channel Pacing Threshold Pulse Width: 0.7 ms
Lead Channel Setting Pacing Pulse Width: 0.5 ms
Lead Channel Setting Sensing Sensitivity: 4 mV
MDC IDC LEAD IMPLANT DT: 20110209
MDC IDC LEAD LOCATION: 753858
MDC IDC MSMT LEADCHNL LV PACING THRESHOLD AMPLITUDE: 1 V
MDC IDC MSMT LEADCHNL LV PACING THRESHOLD AMPLITUDE: 1 V
MDC IDC MSMT LEADCHNL LV PACING THRESHOLD PULSEWIDTH: 0.7 ms
MDC IDC MSMT LEADCHNL RV PACING THRESHOLD PULSEWIDTH: 0.5 ms
MDC IDC MSMT LEADCHNL RV SENSING INTR AMPL: 12 mV
MDC IDC PG IMPLANT DT: 20171208
MDC IDC PG SERIAL: 7953752
MDC IDC SESS DTM: 20180321143554
MDC IDC SET LEADCHNL LV PACING AMPLITUDE: 2.5 V
MDC IDC SET LEADCHNL LV PACING PULSEWIDTH: 0.7 ms
MDC IDC SET LEADCHNL RV PACING AMPLITUDE: 2 V

## 2017-01-01 NOTE — Progress Notes (Signed)
HPI Mr. Paredez returns today for followup. He is a very pleasant 76 year old man with chronic systolic heart failure, CHB, atrial fibrillation, a nonischemic cardiomyopathy, status post biventricular pacemaker insertion. The patient denies medical noncompliance. In the interim he has undergone PPM generator change out. He feels well.  No Known Allergies   Current Outpatient Prescriptions  Medication Sig Dispense Refill  . allopurinol (ZYLOPRIM) 300 MG tablet Take 300 mg by mouth daily.      . benazepril (LOTENSIN) 10 MG tablet Take 1 tablet (10 mg total) by mouth daily. 30 tablet 6  . carvedilol (COREG) 12.5 MG tablet Take 1 tablet (12.5 mg total) by mouth 2 (two) times daily. 30 tablet 6  . digoxin (LANOXIN) 0.125 MG tablet Take 0.125 mg by mouth daily.    . ferrous sulfate 325 (65 FE) MG tablet Take 325 mg by mouth daily with breakfast.     . furosemide (LASIX) 80 MG tablet Take 1 tablet (80 mg total) by mouth daily with breakfast. Can take an extra 40 mg as needed for swelling 30 tablet 6  . hydrALAZINE (APRESOLINE) 50 MG tablet Take 50 mg by mouth 3 (three) times daily.    Marland Kitchen HYDROPHILIC EX Apply 1 application topically daily as needed (For dry skin/heels).     . hydroxyurea (HYDREA) 500 MG capsule Take 500 mg by mouth daily. May take with food to minimize GI side effects.    . insulin NPH (HUMULIN N,NOVOLIN N) 100 UNIT/ML injection Inject 14 Units into the skin daily before breakfast.     . sildenafil (VIAGRA) 100 MG tablet TAKE 1 TABLET BY MOUTH EVERY DAY AS NEEDED FOR ERECTILE DYSFUNCTION 10 tablet 2  . simvastatin (ZOCOR) 20 MG tablet Take 20 mg by mouth at bedtime.    Marland Kitchen spironolactone (ALDACTONE) 25 MG tablet Take 1 tablet (25 mg total) by mouth daily. 30 tablet 6  . terazosin (HYTRIN) 2 MG capsule Take 2 mg by mouth at bedtime. For help with urination    . warfarin (COUMADIN) 6 MG tablet TAKE AS DIRECTED BY THE COUMADIN CLINIC (Patient taking differently: TAKE AS DIRECTED BY THE  COUMADIN CLINIC TAKING 1 TABLET DAILY) 120 tablet 2   No current facility-administered medications for this visit.      Past Medical History:  Diagnosis Date  . Atrial fibrillation (HCC)    Chronic. On coumadin.  . CHF (congestive heart failure) ([place])    secondary to nonischemic cardiomyopathy. EF initially 20-25% up to 50% in 2008. Echo 2/12 EF 20-25% . RHC 3/12: RA 21  RV 81/16/24  PA 81/36 (56) PCWP 33 (v to 43) PA sat 43% 48%.status post Medtronic biventricular ICD placement.(with Sprint Fidelis leads)-explanted--now with St.Jude Anthem BivPM   . Coronary artery disease, non-occlusive    Cath 3/12: mLAD 70%, m-dLAD 40%; Dx 60%; mCFX 30-40%; pRCA 30%)  . Diabetes mellitus   . Hyperlipidemia   . Hypertension   . Obesities, morbid ([place])   . Polycythemia    vera requiring phlebotomy and treatment with hydroxyurea on a chronic bais. The patient followed by the [facility] for this.  . 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   Past Surgical History:  Procedure Laterality Date  . CARDIAC CATHETERIZATION    . EP IMPLANTABLE DEVICE N/A 09/20/2016   Procedure: BiV Pacemaker Generator Changeout;  Surgeon: Evans Lance, MD;  Location: [place]  CV LAB;  Service: Cardiovascular;  Laterality: N/A;  . INSERT / REPLACE / REMOVE PACEMAKER     ICD - St. Jude     No family history on file.   Social History   Social History  . Marital status: Married    Spouse name: N/A  . Number of children: N/A  . Years of education: N/A   Occupational History  . Not on file.   Social History Main Topics  . Smoking status: Former Research scientist (life sciences)  . Smokeless tobacco: Never Used  . Alcohol use No  . Drug use: No  . Sexual activity: Not on file   Other Topics Concern  . Not on file   Social History Narrative   Retired   Married    Tobacco Use - No   Alcohol Use - No     BP 128/86   Pulse 80   Ht 6'  1" (1.854 m)   Wt 248 lb 9.6 oz (112.8 kg)   SpO2 97%   BMI 32.80 kg/m   Physical Exam:  Well appearing 76 year old man, NAD HEENT: Unremarkable Neck:  6 cm JVD, no thyromegally Lungs:  Clear with no wheezes, rhonchi, or increased work of breathing. Bilateral rales are present approximately 1/3 the way up. HEART:  Regular rate rhythm, no murmurs, no rubs, no clicks Abd:  soft, positive bowel sounds, no organomegally, no rebound, no guarding Ext:  2 plus pulses, no cyanosis, no clubbing, 1+ peripheral edema bilaterally Skin:  No rashes no nodules Neuro:  CN II through XII intact, motor grossly intact   DEVICE  Normal device function.  See PaceArt for details.   Assess/Plan:  1. Chronic systolic heart failure - his symptoms are class 2. He has previously reduced his dose of beta blocker and ACE inhibitor and he feels better. As he has been on digoxin for a long time, I am reluctant to stop this though we might in the future. 2. PPM - his BiV PPM is working normally and has over 7 years of battery longevity. 3. Atrial fib - his ventricular rate is well controlled in the setting of CHB. He will continue his warfarin. 4. CAD - he denies anginal symptoms. Will follow.  Mikle Bosworth.D.

## 2017-01-01 NOTE — Patient Instructions (Signed)
Medication Instructions:  None ordered  Labwork: None ordered  Testing/Procedures: None ordered  Follow-Up: Remote monitoring is used to monitor your Pacemaker from home. This monitoring reduces the number of office visits required to check your device to one time per year. It allows [place] to keep an eye on the functioning of your device to ensure it is working properly. You are scheduled for a device check from home on 04/02/17. You may send your transmission at any time that day. If you have a wireless device, the transmission will be sent automatically. After your physician reviews your transmission, you will receive a postcard with your next transmission date.  Your physician wants you to follow-up in: 9 months with Dr. Lovena Le. You will receive a reminder letter in the mail two months in advance. If you don't receive a letter, please call our office to schedule the follow-up appointment.    Any Other Special Instructions Will Be Listed Below (If Applicable).     If you need a refill on your cardiac medications before your next appointment, please call your pharmacy.

## 2017-01-13 ENCOUNTER — Ambulatory Visit (INDEPENDENT_AMBULATORY_CARE_PROVIDER_SITE_OTHER): Payer: Medicare Other

## 2017-01-13 DIAGNOSIS — Z7901 Long term (current) use of anticoagulants: Secondary | ICD-10-CM

## 2017-01-13 DIAGNOSIS — I2510 Atherosclerotic heart disease of native coronary artery without angina pectoris: Secondary | ICD-10-CM

## 2017-01-13 DIAGNOSIS — I4891 Unspecified atrial fibrillation: Secondary | ICD-10-CM

## 2017-01-13 LAB — POCT INR: INR: 3.4

## 2017-02-10 ENCOUNTER — Ambulatory Visit (INDEPENDENT_AMBULATORY_CARE_PROVIDER_SITE_OTHER): Payer: Medicare Other | Admitting: *Deleted

## 2017-02-10 DIAGNOSIS — I4891 Unspecified atrial fibrillation: Secondary | ICD-10-CM | POA: Diagnosis not present

## 2017-02-10 DIAGNOSIS — I2510 Atherosclerotic heart disease of native coronary artery without angina pectoris: Secondary | ICD-10-CM

## 2017-02-10 DIAGNOSIS — Z7901 Long term (current) use of anticoagulants: Secondary | ICD-10-CM

## 2017-02-10 LAB — POCT INR: INR: 5.9

## 2017-02-20 ENCOUNTER — Ambulatory Visit (INDEPENDENT_AMBULATORY_CARE_PROVIDER_SITE_OTHER): Payer: Medicare Other | Admitting: *Deleted

## 2017-02-20 DIAGNOSIS — Z7901 Long term (current) use of anticoagulants: Secondary | ICD-10-CM | POA: Diagnosis not present

## 2017-02-20 DIAGNOSIS — I4891 Unspecified atrial fibrillation: Secondary | ICD-10-CM | POA: Diagnosis not present

## 2017-02-20 DIAGNOSIS — I2510 Atherosclerotic heart disease of native coronary artery without angina pectoris: Secondary | ICD-10-CM

## 2017-02-20 LAB — POCT INR: INR: 3.2

## 2017-03-06 ENCOUNTER — Other Ambulatory Visit (HOSPITAL_COMMUNITY): Payer: Self-pay | Admitting: Pharmacist

## 2017-03-06 ENCOUNTER — Ambulatory Visit (INDEPENDENT_AMBULATORY_CARE_PROVIDER_SITE_OTHER): Payer: Medicare Other | Admitting: *Deleted

## 2017-03-06 DIAGNOSIS — I4891 Unspecified atrial fibrillation: Secondary | ICD-10-CM | POA: Diagnosis not present

## 2017-03-06 DIAGNOSIS — Z7901 Long term (current) use of anticoagulants: Secondary | ICD-10-CM

## 2017-03-06 DIAGNOSIS — I2510 Atherosclerotic heart disease of native coronary artery without angina pectoris: Secondary | ICD-10-CM

## 2017-03-06 LAB — POCT INR: INR: 2.4

## 2017-03-25 ENCOUNTER — Ambulatory Visit (INDEPENDENT_AMBULATORY_CARE_PROVIDER_SITE_OTHER): Payer: Medicare Other

## 2017-03-25 DIAGNOSIS — I4891 Unspecified atrial fibrillation: Secondary | ICD-10-CM

## 2017-03-25 DIAGNOSIS — Z7901 Long term (current) use of anticoagulants: Secondary | ICD-10-CM

## 2017-03-25 DIAGNOSIS — I2510 Atherosclerotic heart disease of native coronary artery without angina pectoris: Secondary | ICD-10-CM

## 2017-03-25 LAB — POCT INR: INR: 2

## 2017-04-02 ENCOUNTER — Ambulatory Visit (INDEPENDENT_AMBULATORY_CARE_PROVIDER_SITE_OTHER): Payer: Medicare Other | Admitting: *Deleted

## 2017-04-02 DIAGNOSIS — I5022 Chronic systolic (congestive) heart failure: Secondary | ICD-10-CM

## 2017-04-02 DIAGNOSIS — I482 Chronic atrial fibrillation, unspecified: Secondary | ICD-10-CM

## 2017-04-03 NOTE — Progress Notes (Signed)
Remote pacemaker transmission.   

## 2017-04-04 ENCOUNTER — Encounter: Payer: Self-pay | Admitting: Cardiology

## 2017-04-16 LAB — CUP PACEART REMOTE DEVICE CHECK
Battery Remaining Percentage: 95.5 %
Implantable Lead Implant Date: 20060828
Implantable Lead Implant Date: 20110209
Implantable Lead Location: 753860
Implantable Lead Model: 5076
Implantable Pulse Generator Implant Date: 20171208
Lead Channel Impedance Value: 450 Ohm
Lead Channel Impedance Value: 590 Ohm
Lead Channel Pacing Threshold Amplitude: 0.75 V
Lead Channel Pacing Threshold Pulse Width: 0.5 ms
Lead Channel Sensing Intrinsic Amplitude: 12 mV
Lead Channel Setting Pacing Amplitude: 2.5 V
Lead Channel Setting Pacing Pulse Width: 0.5 ms
Lead Channel Setting Pacing Pulse Width: 0.7 ms
MDC IDC LEAD IMPLANT DT: 20060828
MDC IDC LEAD LOCATION: 753858
MDC IDC LEAD LOCATION: 753859
MDC IDC MSMT BATTERY REMAINING LONGEVITY: 88 mo
MDC IDC MSMT BATTERY VOLTAGE: 2.99 V
MDC IDC MSMT LEADCHNL LV PACING THRESHOLD AMPLITUDE: 1 V
MDC IDC MSMT LEADCHNL LV PACING THRESHOLD PULSEWIDTH: 0.7 ms
MDC IDC SESS DTM: 20180620064148
MDC IDC SET LEADCHNL RV PACING AMPLITUDE: 2 V
MDC IDC SET LEADCHNL RV SENSING SENSITIVITY: 4 mV
Pulse Gen Model: 3222
Pulse Gen Serial Number: 7953752

## 2017-04-22 ENCOUNTER — Ambulatory Visit (INDEPENDENT_AMBULATORY_CARE_PROVIDER_SITE_OTHER): Payer: Medicare Other | Admitting: *Deleted

## 2017-04-22 DIAGNOSIS — Z7901 Long term (current) use of anticoagulants: Secondary | ICD-10-CM

## 2017-04-22 DIAGNOSIS — I4891 Unspecified atrial fibrillation: Secondary | ICD-10-CM

## 2017-04-22 DIAGNOSIS — I2510 Atherosclerotic heart disease of native coronary artery without angina pectoris: Secondary | ICD-10-CM

## 2017-04-22 LAB — POCT INR: INR: 2

## 2017-05-20 ENCOUNTER — Ambulatory Visit (INDEPENDENT_AMBULATORY_CARE_PROVIDER_SITE_OTHER): Payer: Medicare Other | Admitting: Pharmacist

## 2017-05-20 ENCOUNTER — Encounter (INDEPENDENT_AMBULATORY_CARE_PROVIDER_SITE_OTHER): Payer: Self-pay

## 2017-05-20 DIAGNOSIS — I4891 Unspecified atrial fibrillation: Secondary | ICD-10-CM

## 2017-05-20 DIAGNOSIS — Z7901 Long term (current) use of anticoagulants: Secondary | ICD-10-CM

## 2017-05-20 DIAGNOSIS — I2510 Atherosclerotic heart disease of native coronary artery without angina pectoris: Secondary | ICD-10-CM

## 2017-05-20 LAB — POCT INR: INR: 2.1

## 2017-06-13 ENCOUNTER — Telehealth: Payer: Self-pay

## 2017-06-13 NOTE — Telephone Encounter (Signed)
Follow up  ° ° ° °Pt is returning call to RN. °

## 2017-06-13 NOTE — Telephone Encounter (Signed)
Spoke with patient regarding [place] on 8/30 at 11pm. Patient reports taking all medication as prescribed and that he was asleep at the time of the event. I explained that we will continue to monitor and call back if there are any further recommendations.

## 2017-06-13 NOTE — Telephone Encounter (Signed)
LVM for call back in regards to [facility] episode

## 2017-07-01 ENCOUNTER — Ambulatory Visit (INDEPENDENT_AMBULATORY_CARE_PROVIDER_SITE_OTHER): Payer: Medicare Other

## 2017-07-01 DIAGNOSIS — Z7901 Long term (current) use of anticoagulants: Secondary | ICD-10-CM | POA: Diagnosis not present

## 2017-07-01 DIAGNOSIS — I4891 Unspecified atrial fibrillation: Secondary | ICD-10-CM

## 2017-07-01 LAB — POCT INR: INR: 2

## 2017-07-02 ENCOUNTER — Ambulatory Visit (INDEPENDENT_AMBULATORY_CARE_PROVIDER_SITE_OTHER): Payer: Medicare Other | Admitting: *Deleted

## 2017-07-02 DIAGNOSIS — I5022 Chronic systolic (congestive) heart failure: Secondary | ICD-10-CM | POA: Diagnosis not present

## 2017-07-02 DIAGNOSIS — Z950 Presence of cardiac pacemaker: Secondary | ICD-10-CM

## 2017-07-02 NOTE — Progress Notes (Signed)
Remote pacemaker transmission.   

## 2017-07-03 LAB — CUP PACEART REMOTE DEVICE CHECK
Battery Remaining Longevity: 89 mo
Battery Remaining Percentage: 95.5 %
Battery Voltage: 2.99 V
Implantable Lead Implant Date: 20060828
Implantable Lead Implant Date: 20060828
Implantable Lead Implant Date: 20110209
Implantable Lead Location: 753859
Implantable Lead Location: 753860
Implantable Lead Model: 4194
Implantable Lead Model: 5076
Lead Channel Impedance Value: 440 Ohm
Lead Channel Impedance Value: 710 Ohm
Lead Channel Pacing Threshold Amplitude: 0.75 V
Lead Channel Pacing Threshold Amplitude: 1 V
Lead Channel Pacing Threshold Pulse Width: 0.5 ms
Lead Channel Setting Pacing Amplitude: 2.5 V
Lead Channel Setting Pacing Pulse Width: 0.5 ms
MDC IDC LEAD LOCATION: 753858
MDC IDC MSMT LEADCHNL LV PACING THRESHOLD PULSEWIDTH: 0.7 ms
MDC IDC MSMT LEADCHNL RV SENSING INTR AMPL: 12 mV
MDC IDC PG IMPLANT DT: 20171208
MDC IDC PG SERIAL: 7953752
MDC IDC SESS DTM: 20180919060012
MDC IDC SET LEADCHNL LV PACING PULSEWIDTH: 0.7 ms
MDC IDC SET LEADCHNL RV PACING AMPLITUDE: 2 V
MDC IDC SET LEADCHNL RV SENSING SENSITIVITY: 4 mV

## 2017-07-04 ENCOUNTER — Encounter: Payer: Self-pay | Admitting: Cardiology

## 2017-07-29 IMAGING — DX DG CHEST 2V
2 series · 2 of 2 positions shown · non-contrast
Comparison: December 21, 2010

CLINICAL DATA: Shortness of breath for 1 day

EXAM:
CHEST  2 VIEW

[w chest pa]
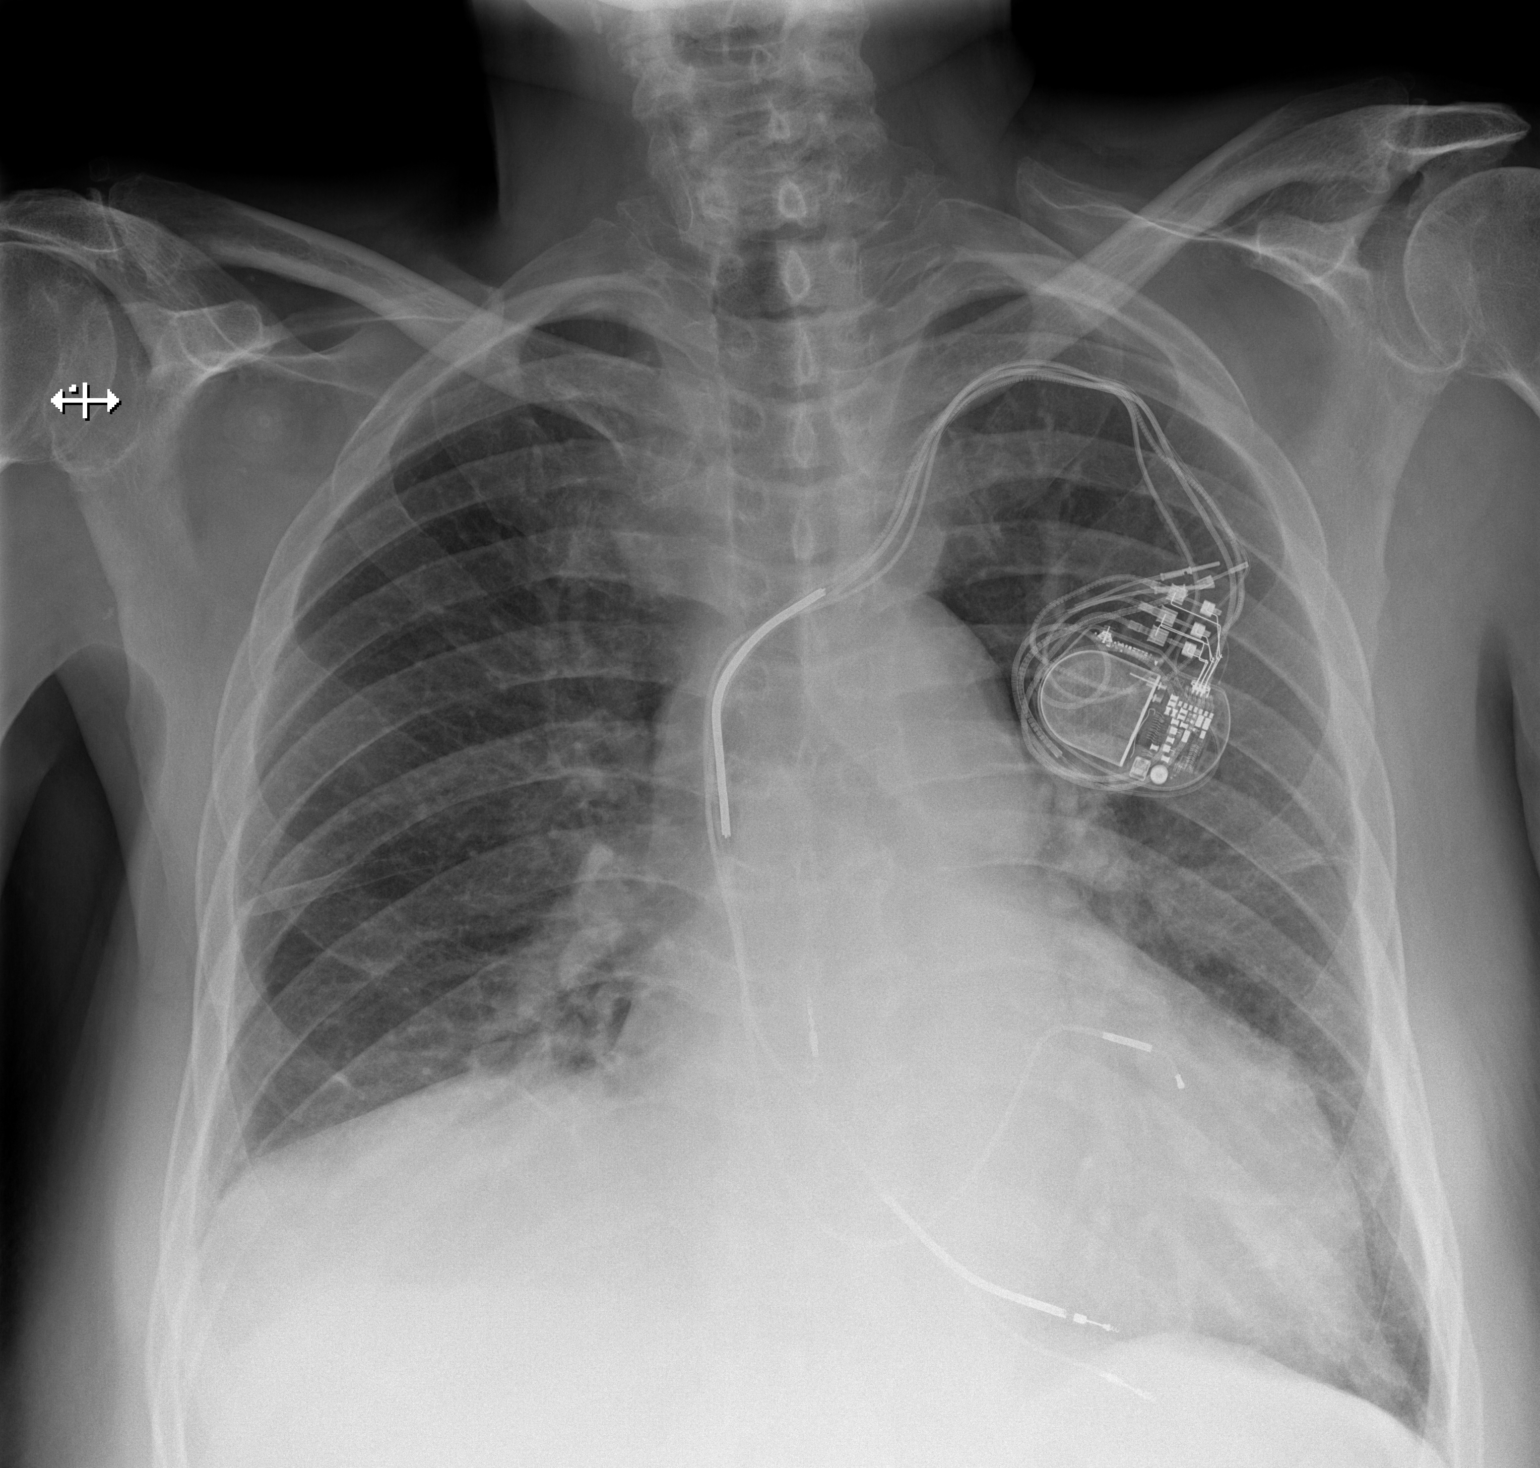

[w chest lat]
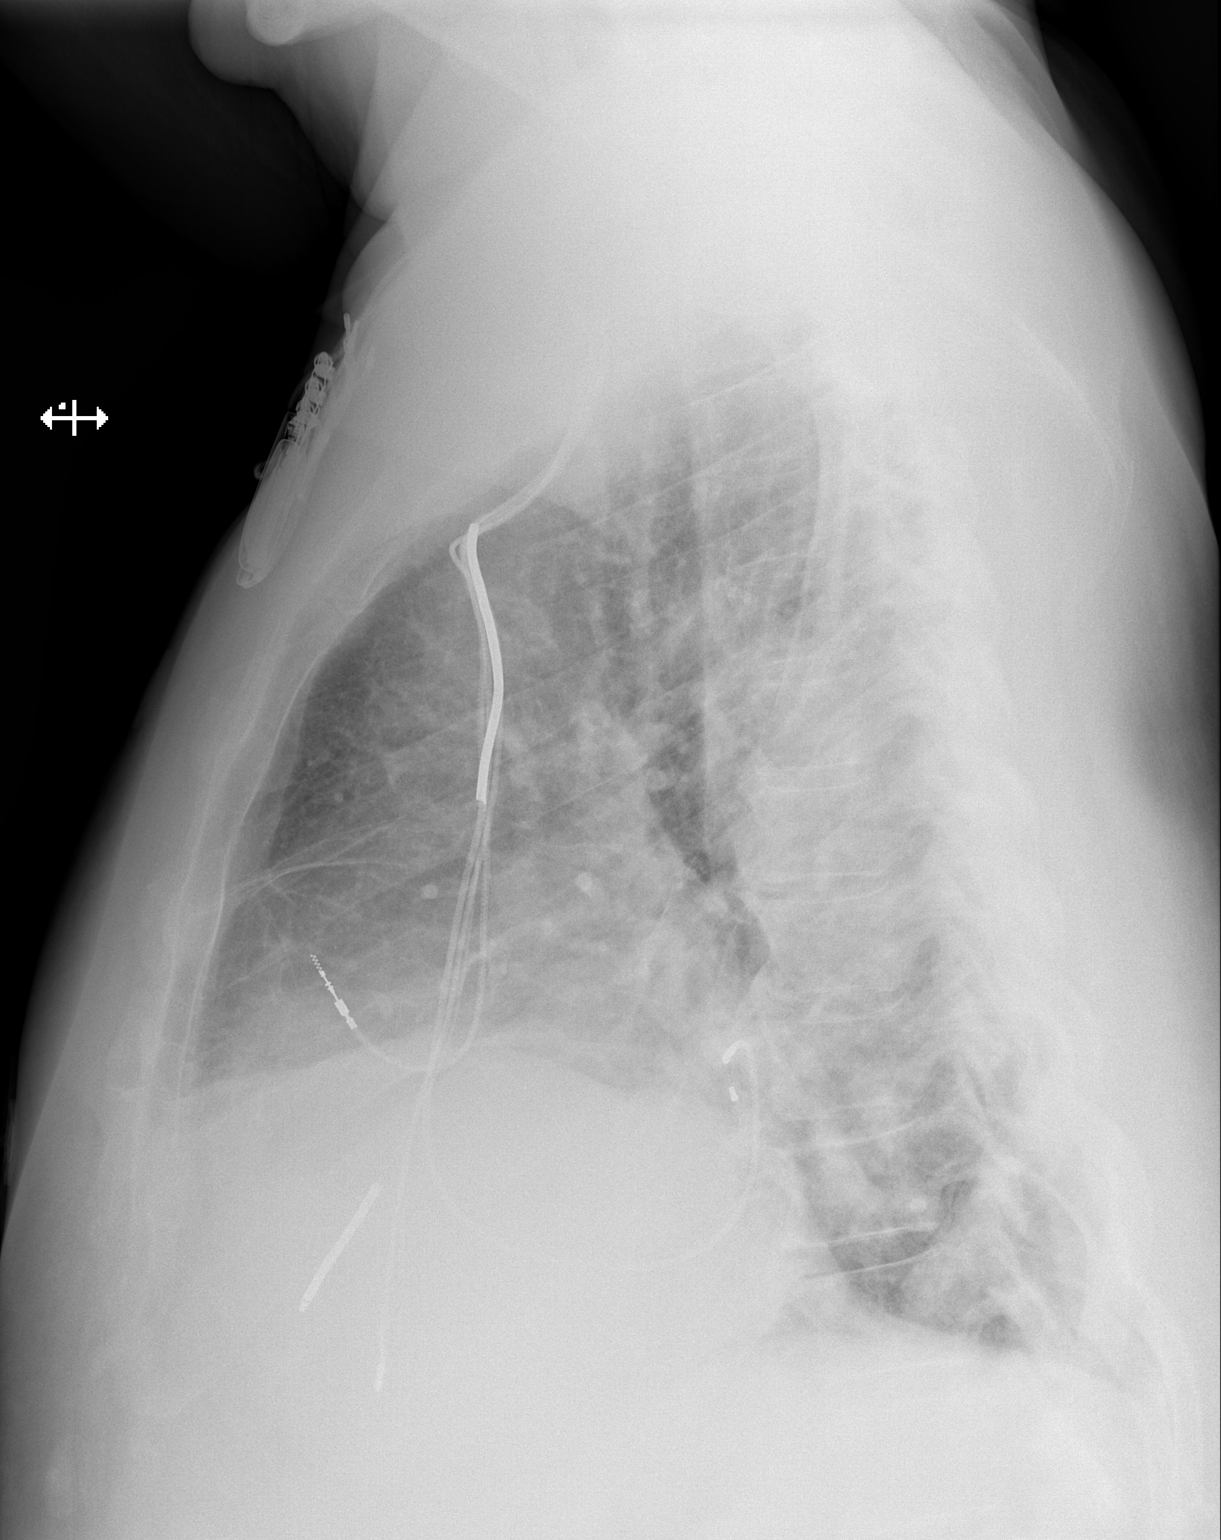

[2 of 2 positions shown; findings below may reference images not displayed]

FINDINGS: There is mild scarring in the left mid lung and left base regions.
There is no edema or consolidation. The heart is upper normal in
size with pulmonary vascularity within normal limits. Pacemaker
leads are attached to the right atrium, right ventricle, and left
ventricle. No pneumothorax. No adenopathy. There is mild
degenerative change in the thoracic spine. Stable mild elevation of
the right hemidiaphragm persists.
IMPRESSION: No edema or consolidation. Areas of mild atelectasis on the left. No
change in cardiac silhouette.

## 2017-08-12 ENCOUNTER — Ambulatory Visit (INDEPENDENT_AMBULATORY_CARE_PROVIDER_SITE_OTHER): Payer: Medicare Other

## 2017-08-12 DIAGNOSIS — I4891 Unspecified atrial fibrillation: Secondary | ICD-10-CM | POA: Diagnosis not present

## 2017-08-12 DIAGNOSIS — Z7901 Long term (current) use of anticoagulants: Secondary | ICD-10-CM | POA: Diagnosis not present

## 2017-08-12 LAB — POCT INR: INR: 2.3

## 2017-10-01 ENCOUNTER — Ambulatory Visit (INDEPENDENT_AMBULATORY_CARE_PROVIDER_SITE_OTHER): Payer: Medicare Other | Admitting: *Deleted

## 2017-10-01 DIAGNOSIS — I480 Paroxysmal atrial fibrillation: Secondary | ICD-10-CM

## 2017-10-01 DIAGNOSIS — I5022 Chronic systolic (congestive) heart failure: Secondary | ICD-10-CM | POA: Diagnosis not present

## 2017-10-01 NOTE — Progress Notes (Signed)
Remote pacemaker transmission.   

## 2017-10-02 ENCOUNTER — Encounter: Payer: Self-pay | Admitting: Cardiology

## 2017-10-03 LAB — CUP PACEART REMOTE DEVICE CHECK
Battery Remaining Percentage: 95.5 %
Battery Voltage: 2.99 V
Date Time Interrogation Session: 20181219070016
Implantable Lead Implant Date: 20060828
Implantable Lead Implant Date: 20110209
Implantable Lead Location: 753858
Implantable Lead Model: 5076
Implantable Pulse Generator Implant Date: 20171208
Lead Channel Impedance Value: 460 Ohm
Lead Channel Impedance Value: 710 Ohm
Lead Channel Pacing Threshold Amplitude: 0.625 V
Lead Channel Pacing Threshold Amplitude: 1 V
Lead Channel Pacing Threshold Pulse Width: 0.7 ms
Lead Channel Sensing Intrinsic Amplitude: 12 mV
Lead Channel Setting Pacing Amplitude: 2 V
Lead Channel Setting Pacing Amplitude: 2.5 V
Lead Channel Setting Pacing Pulse Width: 0.5 ms
Lead Channel Setting Pacing Pulse Width: 0.7 ms
Lead Channel Setting Sensing Sensitivity: 4 mV
MDC IDC LEAD IMPLANT DT: 20060828
MDC IDC LEAD LOCATION: 753859
MDC IDC LEAD LOCATION: 753860
MDC IDC MSMT BATTERY REMAINING LONGEVITY: 96 mo
MDC IDC MSMT LEADCHNL RV PACING THRESHOLD PULSEWIDTH: 0.5 ms
MDC IDC PG SERIAL: 7953752

## 2017-10-09 ENCOUNTER — Encounter: Payer: Self-pay | Admitting: Internal Medicine

## 2017-10-09 ENCOUNTER — Ambulatory Visit (INDEPENDENT_AMBULATORY_CARE_PROVIDER_SITE_OTHER): Payer: Medicare Other | Admitting: Internal Medicine

## 2017-10-09 VITALS — BP 100/62 | HR 79 | Ht 73.0 in | Wt 240.6 lb

## 2017-10-09 DIAGNOSIS — I480 Paroxysmal atrial fibrillation: Secondary | ICD-10-CM | POA: Diagnosis not present

## 2017-10-09 DIAGNOSIS — I2510 Atherosclerotic heart disease of native coronary artery without angina pectoris: Secondary | ICD-10-CM

## 2017-10-09 DIAGNOSIS — I5022 Chronic systolic (congestive) heart failure: Secondary | ICD-10-CM | POA: Diagnosis not present

## 2017-10-09 DIAGNOSIS — Z950 Presence of cardiac pacemaker: Secondary | ICD-10-CM

## 2017-10-09 LAB — CUP PACEART INCLINIC DEVICE CHECK
Battery Remaining Longevity: 90 mo
Battery Voltage: 2.99 V
Brady Statistic RA Percent Paced: 0 %
Implantable Lead Implant Date: 20110209
Implantable Lead Location: 753859
Implantable Lead Location: 753860
Implantable Lead Model: 4194
Implantable Lead Model: 5076
Implantable Pulse Generator Implant Date: 20171208
Lead Channel Impedance Value: 437.5 Ohm
Lead Channel Impedance Value: 712.5 Ohm
Lead Channel Pacing Threshold Amplitude: 0.75 V
Lead Channel Pacing Threshold Amplitude: 0.75 V
Lead Channel Pacing Threshold Amplitude: 0.75 V
Lead Channel Pacing Threshold Amplitude: 0.75 V
Lead Channel Pacing Threshold Pulse Width: 0.5 ms
Lead Channel Pacing Threshold Pulse Width: 0.5 ms
Lead Channel Pacing Threshold Pulse Width: 0.7 ms
Lead Channel Pacing Threshold Pulse Width: 0.7 ms
Lead Channel Setting Pacing Amplitude: 2 V
Lead Channel Setting Pacing Amplitude: 2.5 V
Lead Channel Setting Sensing Sensitivity: 4 mV
MDC IDC LEAD IMPLANT DT: 20060828
MDC IDC LEAD IMPLANT DT: 20060828
MDC IDC LEAD LOCATION: 753858
MDC IDC MSMT LEADCHNL RV SENSING INTR AMPL: 12 mV
MDC IDC PG SERIAL: 7953752
MDC IDC SESS DTM: 20181227162639
MDC IDC SET LEADCHNL LV PACING PULSEWIDTH: 0.7 ms
MDC IDC SET LEADCHNL RV PACING PULSEWIDTH: 0.5 ms
MDC IDC STAT BRADY RV PERCENT PACED: 97 %

## 2017-10-09 NOTE — Progress Notes (Signed)
HPI [name] returns today for ongoing evaluation and management of his pacemaker, chronic atrial fibrillation, complete heart block, and chronic systolic heart failure.  In the interim he is done well.  He has not been in the hospital and denies peripheral edema.  No chest pain or shortness of breath.  No syncope. No Known Allergies   Current Outpatient Medications  Medication Sig Dispense Refill  . allopurinol (ZYLOPRIM) 300 MG tablet Take 300 mg by mouth daily.      . carvedilol (COREG) 12.5 MG tablet Take 1 tablet (12.5 mg total) by mouth 2 (two) times daily. 30 tablet 6  . digoxin (LANOXIN) 0.125 MG tablet Take 0.125 mg by mouth daily.    . ferrous sulfate 325 (65 FE) MG tablet Take 325 mg by mouth daily with breakfast.     . furosemide (LASIX) 80 MG tablet Take 1 tablet (80 mg total) by mouth daily with breakfast. Can take an extra 40 mg as needed for swelling 30 tablet 6  . hydrALAZINE (APRESOLINE) 50 MG tablet Take 50 mg by mouth 3 (three) times daily.    Marland Kitchen HYDROPHILIC EX Apply 1 application topically daily as needed (For dry skin/heels).     . hydroxyurea (HYDREA) 500 MG capsule Take 500 mg by mouth daily. May take with food to minimize GI side effects.    . insulin NPH (HUMULIN N,NOVOLIN N) 100 UNIT/ML injection Inject 14 Units into the skin daily before breakfast.     . metformin (FORTAMET) 500 MG (OSM) 24 hr tablet Take 500 mg by mouth daily with breakfast.    . sacubitril-valsartan (ENTRESTO) 24-26 MG Take 1 tablet by mouth 2 (two) times daily.    . sildenafil (VIAGRA) 100 MG tablet TAKE 1 TABLET BY MOUTH EVERY DAY AS NEEDED FOR ERECTILE DYSFUNCTION 10 tablet 2  . simvastatin (ZOCOR) 20 MG tablet Take 20 mg by mouth at bedtime.    Marland Kitchen spironolactone (ALDACTONE) 25 MG tablet Take 1 tablet (25 mg total) by mouth daily. 30 tablet 6  . terazosin (HYTRIN) 2 MG capsule Take 2 mg by mouth at bedtime. For help with urination    . warfarin (COUMADIN) 6 MG tablet TAKE AS DIRECTED  BY THE COUMADIN CLINIC (Patient taking differently: TAKE AS DIRECTED BY THE COUMADIN CLINIC TAKING 1 TABLET DAILY) 120 tablet 2   No current facility-administered medications for this visit.      Past Medical History:  Diagnosis Date  . Atrial fibrillation (HCC)    Chronic. On coumadin.  . CHF (congestive heart failure) ([place])    secondary to nonischemic cardiomyopathy. EF initially 20-25% up to 50% in 2008. Echo 2/12 EF 20-25% . RHC 3/12: RA 21  RV 81/16/24  PA 81/36 (56) PCWP 33 (v to 43) PA sat 43% 48%.status post Medtronic biventricular ICD placement.(with Sprint Fidelis leads)-explanted--now with St.Jude Anthem BivPM   . Coronary artery disease, non-occlusive    Cath 3/12: mLAD 70%, m-dLAD 40%; Dx 60%; mCFX 30-40%; pRCA 30%)  . Diabetes mellitus   . Hyperlipidemia   . Hypertension   . Obesities, morbid ([place])   . Polycythemia    vera requiring phlebotomy and treatment with hydroxyurea on a chronic bais. The patient followed by the [facility] for this.  . 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   Past Surgical History:  Procedure Laterality Date  .  CARDIAC CATHETERIZATION    . EP IMPLANTABLE DEVICE N/A 09/20/2016   Procedure: BiV Pacemaker Generator Changeout;  Surgeon: Evans Lance, MD;  Location: [facility];  Service: Cardiovascular;  Laterality: N/A;  . INSERT / REPLACE / REMOVE PACEMAKER     ICD - St. Jude     No family history on file.   Social History   Socioeconomic History  . Marital status: Married    Spouse name: Not on file  . Number of children: Not on file  . Years of education: Not on file  . Highest education level: Not on file  Social Needs  . Financial resource strain: Not on file  . Food insecurity - worry: Not on file  . Food insecurity - inability: Not on file  . Transportation needs - medical: Not on file  . Transportation needs - non-medical: Not  on file  Occupational History  . Not on file  Tobacco Use  . Smoking status: Former Research scientist (life sciences)  . Smokeless tobacco: Never Used  Substance and Sexual Activity  . Alcohol use: No  . Drug use: No  . Sexual activity: Not on file  Other Topics Concern  . Not on file  Social History Narrative   Retired   Married    Tobacco Use - No   Alcohol Use - No     BP 100/62   Pulse 79   Ht 6\' 1"  (1.854 m)   Wt 240 lb 9.6 oz (109.1 kg)   SpO2 96%   BMI 31.74 kg/m   Physical Exam:  Well appearing 76 year old man, NAD HEENT: Unremarkable Neck: 7 cm JVD, no thyromegally Lymphatics:  No adenopathy Back:  No CVA tenderness Lungs:  Clear, with no wheezes, rales, or rhonchi HEART:  Regular rate rhythm, no murmurs, no rubs, no clicks Abd:  soft, positive bowel sounds, no organomegally, no rebound, no guarding Ext:  2 plus pulses, no edema, no cyanosis, no clubbing Skin:  No rashes no nodules Neuro:  CN II through XII intact, motor grossly intact  DEVICE  Normal device function.  See PaceArt for details.   Assess/Plan: 1.  Chronic systolic heart failure -his symptoms are class IIa.  He will continue his current medications as prescribed.  No change at this time.  He is encouraged to maintain a low-sodium diet and remain active. 2.  Pacemaker -his [place] dual-chamber biventricular pacemaker is working normally.  We will plan to recheck in several months.  He has approximately 7 years of battery longevity 3.  Atrial fibrillation -his ventricular rate is well controlled.  He will continue systemic anticoagulation with warfarin. 4.  Coronary artery disease -he denies anginal symptoms.  He is not as active as he once was but continues to remain fairly active.  No change in medications.  Cristopher Peru, MD

## 2017-10-09 NOTE — Patient Instructions (Signed)
Medication Instructions:  Your physician recommends that you continue on your current medications as directed. Please refer to the Current Medication list given to you today.  Labwork: None ordered.  Testing/Procedures: None ordered.  Follow-Up: Your physician wants you to follow-up in: one year with Dr. Lovena Le.   You will receive a reminder letter in the mail two months in advance. If you don't receive a letter, please call our office to schedule the follow-up appointment.  Remote monitoring is used to monitor your Pacemaker from home. This monitoring reduces the number of office visits required to check your device to one time per year. It allows [place] to keep an eye on the functioning of your device to ensure it is working properly. You are scheduled for a device check from home on 12/31/2017. You may send your transmission at any time that day. If you have a wireless device, the transmission will be sent automatically. After your physician reviews your transmission, you will receive a postcard with your next transmission date.    Any Other Special Instructions Will Be Listed Below (If Applicable).     If you need a refill on your cardiac medications before your next appointment, please call your pharmacy.

## 2017-10-21 ENCOUNTER — Ambulatory Visit (INDEPENDENT_AMBULATORY_CARE_PROVIDER_SITE_OTHER): Payer: Medicare Other | Admitting: Pharmacist

## 2017-10-21 DIAGNOSIS — Z7901 Long term (current) use of anticoagulants: Secondary | ICD-10-CM

## 2017-10-21 DIAGNOSIS — I4891 Unspecified atrial fibrillation: Secondary | ICD-10-CM

## 2017-10-21 LAB — POCT INR: INR: 3.4

## 2017-10-21 NOTE — Patient Instructions (Signed)
Description   No Coumadin today then continue on same dosage 1 tablet (6mg ) daily except 1/2 tablet on Mondays and Fridays.  Recheck in 4 weeks.  Call our office if you are placed on any new medications or if you have any upcoming procedures [phone].

## 2017-11-18 ENCOUNTER — Ambulatory Visit (INDEPENDENT_AMBULATORY_CARE_PROVIDER_SITE_OTHER): Payer: Medicare Other

## 2017-11-18 ENCOUNTER — Other Ambulatory Visit: Payer: Self-pay

## 2017-11-18 DIAGNOSIS — Z7901 Long term (current) use of anticoagulants: Secondary | ICD-10-CM

## 2017-11-18 DIAGNOSIS — I4891 Unspecified atrial fibrillation: Secondary | ICD-10-CM | POA: Diagnosis not present

## 2017-11-18 LAB — POCT INR: INR: 2.8

## 2017-11-18 NOTE — Telephone Encounter (Signed)
This is Dr. Clayborne Dana pt he wrote this prescription. Please address

## 2017-11-18 NOTE — Telephone Encounter (Signed)
Pt seen in Coumadin Clinic requesting refill of Viagra sent to [facility].  Pt states Dr Haroldine Laws rx for him.

## 2017-11-18 NOTE — Patient Instructions (Signed)
Description   Continue on same dosage 1 tablet (6mg ) daily except 1/2 tablet on Mondays and Fridays.  Recheck in 4 weeks.  Call our office if you are placed on any new medications or if you have any upcoming procedures [phone].

## 2017-11-28 MED ORDER — SILDENAFIL CITRATE 100 MG PO TABS: ORAL_TABLET | ORAL | 2 refills | Status: AC

## 2017-12-08 ENCOUNTER — Telehealth: Payer: Self-pay

## 2017-12-08 NOTE — Telephone Encounter (Signed)
Spoke with patient about VHR noted on remote transmission. Patient states that he is "feeling good". Patient denies any symptoms at time of event and confirmed that he is taking all medication as prescribed.

## 2017-12-12 ENCOUNTER — Other Ambulatory Visit: Payer: Medicare Other | Admitting: *Deleted

## 2017-12-12 ENCOUNTER — Telehealth: Payer: Self-pay | Admitting: Internal Medicine

## 2017-12-12 NOTE — Telephone Encounter (Signed)
Returned call to Pt.  No answer.  No VM available.

## 2017-12-12 NOTE — Telephone Encounter (Signed)
New Message     Patient called wants to speak with Dr Lovena Le or Sonia Baller regarding a urine test ?

## 2017-12-16 ENCOUNTER — Ambulatory Visit (INDEPENDENT_AMBULATORY_CARE_PROVIDER_SITE_OTHER): Payer: Medicare Other | Admitting: *Deleted

## 2017-12-16 DIAGNOSIS — I4891 Unspecified atrial fibrillation: Secondary | ICD-10-CM

## 2017-12-16 DIAGNOSIS — I482 Chronic atrial fibrillation, unspecified: Secondary | ICD-10-CM

## 2017-12-16 DIAGNOSIS — Z7901 Long term (current) use of anticoagulants: Secondary | ICD-10-CM | POA: Diagnosis not present

## 2017-12-16 DIAGNOSIS — Z5181 Encounter for therapeutic drug level monitoring: Secondary | ICD-10-CM

## 2017-12-16 LAB — POCT INR: INR: 1.7

## 2017-12-16 NOTE — Patient Instructions (Signed)
Description   Today March 5th take 1 and 1/2 tablets then continue on same dosage 1 tablet (6mg ) daily except 1/2 tablet on Mondays and Fridays.  Recheck in 2 weeks.  Call our office if you are placed on any new medications or if you have any upcoming procedures [phone].

## 2017-12-17 ENCOUNTER — Other Ambulatory Visit: Payer: Self-pay | Admitting: Internal Medicine

## 2017-12-17 NOTE — Telephone Encounter (Signed)
Will await call back if needed.  Pt was in office yesterday and had no needs.

## 2017-12-22 ENCOUNTER — Other Ambulatory Visit: Payer: Self-pay | Admitting: Internal Medicine

## 2017-12-30 ENCOUNTER — Ambulatory Visit (INDEPENDENT_AMBULATORY_CARE_PROVIDER_SITE_OTHER): Payer: Medicare Other | Admitting: *Deleted

## 2017-12-30 DIAGNOSIS — Z7901 Long term (current) use of anticoagulants: Secondary | ICD-10-CM

## 2017-12-30 DIAGNOSIS — I4891 Unspecified atrial fibrillation: Secondary | ICD-10-CM | POA: Diagnosis not present

## 2017-12-30 LAB — POCT INR: INR: 1.5

## 2017-12-30 NOTE — Patient Instructions (Signed)
Description   Today and tomorrow take 1.5 tablets, then change your dosage to 1 tablet (6mg ) daily except 1/2 tablet on Mondays.  Recheck in 10 days.   Call our office if you are placed on any new medications or if you have any upcoming procedures [phone].

## 2017-12-31 ENCOUNTER — Ambulatory Visit (INDEPENDENT_AMBULATORY_CARE_PROVIDER_SITE_OTHER): Payer: Medicare Other | Admitting: *Deleted

## 2017-12-31 DIAGNOSIS — I5022 Chronic systolic (congestive) heart failure: Secondary | ICD-10-CM

## 2017-12-31 DIAGNOSIS — I4891 Unspecified atrial fibrillation: Secondary | ICD-10-CM | POA: Diagnosis not present

## 2017-12-31 NOTE — Progress Notes (Signed)
Remote pacemaker transmission.   

## 2018-01-01 ENCOUNTER — Encounter: Payer: Self-pay | Admitting: Cardiology

## 2018-01-02 LAB — CUP PACEART REMOTE DEVICE CHECK
Date Time Interrogation Session: 20190320060014
Implantable Lead Implant Date: 20060828
Implantable Lead Location: 753858
Implantable Pulse Generator Implant Date: 20171208
Lead Channel Impedance Value: 430 Ohm
Lead Channel Pacing Threshold Amplitude: 0.5 V
Lead Channel Pacing Threshold Pulse Width: 0.7 ms
Lead Channel Sensing Intrinsic Amplitude: 12 mV
Lead Channel Setting Pacing Amplitude: 2 V
Lead Channel Setting Pacing Amplitude: 2.5 V
Lead Channel Setting Pacing Pulse Width: 0.5 ms
Lead Channel Setting Pacing Pulse Width: 0.7 ms
Lead Channel Setting Sensing Sensitivity: 4 mV
MDC IDC LEAD IMPLANT DT: 20060828
MDC IDC LEAD IMPLANT DT: 20110209
MDC IDC LEAD LOCATION: 753859
MDC IDC LEAD LOCATION: 753860
MDC IDC MSMT BATTERY REMAINING LONGEVITY: 94 mo
MDC IDC MSMT BATTERY REMAINING PERCENTAGE: 95.5 %
MDC IDC MSMT BATTERY VOLTAGE: 2.99 V
MDC IDC MSMT LEADCHNL LV IMPEDANCE VALUE: 690 Ohm
MDC IDC MSMT LEADCHNL LV PACING THRESHOLD AMPLITUDE: 0.75 V
MDC IDC MSMT LEADCHNL RV PACING THRESHOLD PULSEWIDTH: 0.5 ms
Pulse Gen Model: 3222
Pulse Gen Serial Number: 7953752

## 2018-01-09 ENCOUNTER — Ambulatory Visit (INDEPENDENT_AMBULATORY_CARE_PROVIDER_SITE_OTHER): Payer: Medicare Other | Admitting: Pharmacist

## 2018-01-09 DIAGNOSIS — Z7901 Long term (current) use of anticoagulants: Secondary | ICD-10-CM

## 2018-01-09 DIAGNOSIS — I4891 Unspecified atrial fibrillation: Secondary | ICD-10-CM

## 2018-01-09 LAB — POCT INR: INR: 3.2

## 2018-01-09 NOTE — Patient Instructions (Signed)
Description   Take 1/2 tablet toady, then continue taking 1 tablet daily except 1/2 tablet on Mondays.  Recheck in 2 weeks.   Call our office if you are placed on any new medications or if you have any upcoming procedures [phone].

## 2018-01-23 ENCOUNTER — Ambulatory Visit (INDEPENDENT_AMBULATORY_CARE_PROVIDER_SITE_OTHER): Payer: Medicare Other | Admitting: *Deleted

## 2018-01-23 DIAGNOSIS — I4891 Unspecified atrial fibrillation: Secondary | ICD-10-CM | POA: Diagnosis not present

## 2018-01-23 DIAGNOSIS — Z7901 Long term (current) use of anticoagulants: Secondary | ICD-10-CM | POA: Diagnosis not present

## 2018-01-23 LAB — POCT INR: INR: 2.2

## 2018-01-23 NOTE — Patient Instructions (Signed)
Description   Continue taking 1 tablet daily except 1/2 tablet on Mondays.  Recheck in 3 weeks.   Call our office if you are placed on any new medications or if you have any upcoming procedures [phone].

## 2018-02-03 ENCOUNTER — Other Ambulatory Visit: Payer: Self-pay | Admitting: *Deleted

## 2018-02-03 NOTE — Telephone Encounter (Addendum)
Pt called to request prescription for Warfarin be sent to [facility] not the [facility]. Wife states she will come pick up written prescription if needed. They gave me a number to call the [place] [phone] to send in prescription. Advised if unable to reach the [facility] will call them back & they verbalized understanding. Also, the pt's Home telephone number has changed to [phone]. Spoke with the Pharmacist at the [facility] & she stated that the prescription has to be approved by the PCP (Dr. Arletha Grippe) at [facility] first & then it can be faxed to [phone], did fax prescription at this time; she transferred me back to the main line to speak with Dr. Arletha Grippe for approval. Damaris Schooner with Renetta at [facility] & she took the message regarding this & stated she would forward to Dr. Arletha Grippe & her RN. Called the pt back to update them & they are aware the [facility] needs [facility].  02/03/18 @ 407pm-pt aware that we have not received any calls back from [place] and if he is about to run out of medication then we would need to send to an alternative pharmacy until his PCP at the [place] approves the prescription that was sent.

## 2018-02-04 ENCOUNTER — Other Ambulatory Visit: Payer: Self-pay | Admitting: *Deleted

## 2018-02-04 MED ORDER — WARFARIN SODIUM 6 MG PO TABS: ORAL_TABLET | ORAL | 1 refills | Status: DC

## 2018-02-04 MED ORDER — WARFARIN SODIUM 6 MG PO TABS: ORAL_TABLET | ORAL | 0 refills | Status: DC

## 2018-02-04 NOTE — Telephone Encounter (Signed)
Called pt see if he had an alternative pharmacy to send his Warfarin. Pt states he has been paying thousands of dollars for the medication, pt then stated well not that high but he need the med free. Advised that we do not price the med and that is an insurance thing and he understood.  Pt asked me to speak to his wife and see where to send the refill, she got on the phone and she stated to send the med to [facility] off [address] and 90 day supply to [facility] (where they previously have received the med); wife stated the med cost them $12 and that is fine. The past has a dose of Warfarin for tonight but then will be out so sending to [facility] per request and they will pick up tomorrow.

## 2018-02-13 ENCOUNTER — Ambulatory Visit (INDEPENDENT_AMBULATORY_CARE_PROVIDER_SITE_OTHER): Payer: Medicare Other | Admitting: *Deleted

## 2018-02-13 DIAGNOSIS — I482 Chronic atrial fibrillation, unspecified: Secondary | ICD-10-CM

## 2018-02-13 DIAGNOSIS — I4891 Unspecified atrial fibrillation: Secondary | ICD-10-CM | POA: Diagnosis not present

## 2018-02-13 DIAGNOSIS — Z5181 Encounter for therapeutic drug level monitoring: Secondary | ICD-10-CM

## 2018-02-13 DIAGNOSIS — Z7901 Long term (current) use of anticoagulants: Secondary | ICD-10-CM | POA: Diagnosis not present

## 2018-02-13 LAB — POCT INR: INR: 2.3

## 2018-02-13 NOTE — Patient Instructions (Signed)
Description   Continue taking 1 tablet daily except 1/2 tablet on Mondays.  Recheck in 4 weeks.   Call our office if you are placed on any new medications or if you have any upcoming procedures [phone].

## 2018-03-13 ENCOUNTER — Ambulatory Visit (INDEPENDENT_AMBULATORY_CARE_PROVIDER_SITE_OTHER): Payer: Medicare Other | Admitting: *Deleted

## 2018-03-13 DIAGNOSIS — Z7901 Long term (current) use of anticoagulants: Secondary | ICD-10-CM | POA: Diagnosis not present

## 2018-03-13 DIAGNOSIS — I4891 Unspecified atrial fibrillation: Secondary | ICD-10-CM | POA: Diagnosis not present

## 2018-03-13 LAB — POCT INR: INR: 2.8 (ref 2.0–3.0)

## 2018-03-13 NOTE — Patient Instructions (Signed)
Description   Continue taking 1 tablet daily except 1/2 tablet on Mondays.  Recheck in 6 weeks.   Call our office if you are placed on any new medications or if you have any upcoming procedures [phone].      

## 2018-04-01 ENCOUNTER — Ambulatory Visit (INDEPENDENT_AMBULATORY_CARE_PROVIDER_SITE_OTHER): Payer: Medicare Other | Admitting: *Deleted

## 2018-04-01 ENCOUNTER — Other Ambulatory Visit: Payer: Self-pay | Admitting: Internal Medicine

## 2018-04-01 DIAGNOSIS — I482 Chronic atrial fibrillation, unspecified: Secondary | ICD-10-CM

## 2018-04-01 DIAGNOSIS — I5022 Chronic systolic (congestive) heart failure: Secondary | ICD-10-CM

## 2018-04-01 NOTE — Progress Notes (Signed)
Remote pacemaker transmission.   

## 2018-04-21 LAB — CUP PACEART REMOTE DEVICE CHECK
Battery Remaining Longevity: 94 mo
Battery Remaining Percentage: 95.5 %
Date Time Interrogation Session: 20190619105840
Implantable Lead Implant Date: 20060828
Implantable Lead Implant Date: 20060828
Implantable Lead Location: 753858
Implantable Lead Location: 753860
Lead Channel Impedance Value: 480 Ohm
Lead Channel Impedance Value: 610 Ohm
Lead Channel Pacing Threshold Amplitude: 0.625 V
Lead Channel Pacing Threshold Pulse Width: 0.5 ms
Lead Channel Pacing Threshold Pulse Width: 0.7 ms
Lead Channel Setting Pacing Amplitude: 2.5 V
Lead Channel Setting Pacing Pulse Width: 0.5 ms
Lead Channel Setting Pacing Pulse Width: 0.7 ms
Lead Channel Setting Sensing Sensitivity: 4 mV
MDC IDC LEAD IMPLANT DT: 20110209
MDC IDC LEAD LOCATION: 753859
MDC IDC MSMT BATTERY VOLTAGE: 2.99 V
MDC IDC MSMT LEADCHNL LV PACING THRESHOLD AMPLITUDE: 0.75 V
MDC IDC MSMT LEADCHNL RV SENSING INTR AMPL: 12 mV
MDC IDC PG IMPLANT DT: 20171208
MDC IDC SET LEADCHNL RV PACING AMPLITUDE: 2 V
Pulse Gen Model: 3222
Pulse Gen Serial Number: 7953752

## 2018-04-24 ENCOUNTER — Ambulatory Visit (INDEPENDENT_AMBULATORY_CARE_PROVIDER_SITE_OTHER): Payer: Medicare Other | Admitting: *Deleted

## 2018-04-24 DIAGNOSIS — I482 Chronic atrial fibrillation, unspecified: Secondary | ICD-10-CM

## 2018-04-24 DIAGNOSIS — I4891 Unspecified atrial fibrillation: Secondary | ICD-10-CM

## 2018-04-24 DIAGNOSIS — Z7901 Long term (current) use of anticoagulants: Secondary | ICD-10-CM | POA: Diagnosis not present

## 2018-04-24 DIAGNOSIS — Z5181 Encounter for therapeutic drug level monitoring: Secondary | ICD-10-CM

## 2018-04-24 LAB — POCT INR: INR: 2 (ref 2.0–3.0)

## 2018-04-24 NOTE — Patient Instructions (Signed)
Description   Continue taking 1 tablet daily except 1/2 tablet on Mondays.  Recheck in 6 weeks.   Call our office if you are placed on any new medications or if you have any upcoming procedures [phone].

## 2018-06-05 ENCOUNTER — Encounter (INDEPENDENT_AMBULATORY_CARE_PROVIDER_SITE_OTHER): Payer: Self-pay

## 2018-06-05 ENCOUNTER — Ambulatory Visit (INDEPENDENT_AMBULATORY_CARE_PROVIDER_SITE_OTHER): Payer: Medicare Other

## 2018-06-05 DIAGNOSIS — I4891 Unspecified atrial fibrillation: Secondary | ICD-10-CM | POA: Diagnosis not present

## 2018-06-05 DIAGNOSIS — Z7901 Long term (current) use of anticoagulants: Secondary | ICD-10-CM

## 2018-06-05 LAB — POCT INR: INR: 1.9 — AB (ref 2.0–3.0)

## 2018-06-05 NOTE — Patient Instructions (Signed)
Description   Take 1.5 tablets today, then resume same dosage 1 tablet daily except 1/2 tablet on Mondays.  Recheck in 5 weeks.   Call our office if you are placed on any new medications or if you have any upcoming procedures [phone].

## 2018-06-19 ENCOUNTER — Other Ambulatory Visit: Payer: Self-pay | Admitting: Internal Medicine

## 2018-07-01 ENCOUNTER — Ambulatory Visit (INDEPENDENT_AMBULATORY_CARE_PROVIDER_SITE_OTHER): Payer: Medicare Other | Admitting: *Deleted

## 2018-07-01 DIAGNOSIS — I4891 Unspecified atrial fibrillation: Secondary | ICD-10-CM | POA: Diagnosis not present

## 2018-07-01 DIAGNOSIS — I5022 Chronic systolic (congestive) heart failure: Secondary | ICD-10-CM

## 2018-07-01 NOTE — Progress Notes (Signed)
Remote pacemaker transmission.   

## 2018-07-10 ENCOUNTER — Ambulatory Visit (INDEPENDENT_AMBULATORY_CARE_PROVIDER_SITE_OTHER): Payer: Medicare Other | Admitting: *Deleted

## 2018-07-10 DIAGNOSIS — Z7901 Long term (current) use of anticoagulants: Secondary | ICD-10-CM | POA: Diagnosis not present

## 2018-07-10 DIAGNOSIS — I4891 Unspecified atrial fibrillation: Secondary | ICD-10-CM

## 2018-07-10 LAB — POCT INR: INR: 1.9 — AB (ref 2.0–3.0)

## 2018-07-10 NOTE — Patient Instructions (Signed)
Description   Take 1.5 tablets today, then start taking 1 tablet daily.  Recheck in 3 weeks.   Call our office if you are placed on any new medications or if you have any upcoming procedures [phone].

## 2018-07-30 ENCOUNTER — Ambulatory Visit (INDEPENDENT_AMBULATORY_CARE_PROVIDER_SITE_OTHER): Payer: Medicare Other | Admitting: *Deleted

## 2018-07-30 DIAGNOSIS — Z7901 Long term (current) use of anticoagulants: Secondary | ICD-10-CM

## 2018-07-30 DIAGNOSIS — I4891 Unspecified atrial fibrillation: Secondary | ICD-10-CM | POA: Diagnosis not present

## 2018-07-30 LAB — POCT INR: INR: 2.7 (ref 2.0–3.0)

## 2018-07-30 NOTE — Patient Instructions (Addendum)
Description   Continue taking 1 tablet daily.  Recheck in 3 weeks.   Call our office if you are placed on any new medications or if you have any upcoming procedures [phone].

## 2018-08-03 LAB — CUP PACEART REMOTE DEVICE CHECK
Battery Voltage: 2.99 V
Implantable Lead Implant Date: 20060828
Implantable Lead Location: 753858
Implantable Lead Location: 753859
Implantable Lead Model: 4194
Implantable Pulse Generator Implant Date: 20171208
Lead Channel Impedance Value: 400 Ohm
Lead Channel Pacing Threshold Amplitude: 0.75 V
Lead Channel Pacing Threshold Pulse Width: 0.7 ms
Lead Channel Sensing Intrinsic Amplitude: 8.8 mV
Lead Channel Setting Pacing Amplitude: 2 V
Lead Channel Setting Pacing Pulse Width: 0.5 ms
Lead Channel Setting Sensing Sensitivity: 4 mV
MDC IDC LEAD IMPLANT DT: 20060828
MDC IDC LEAD IMPLANT DT: 20110209
MDC IDC LEAD LOCATION: 753860
MDC IDC MSMT BATTERY REMAINING LONGEVITY: 89 mo
MDC IDC MSMT BATTERY REMAINING PERCENTAGE: 95.5 %
MDC IDC MSMT LEADCHNL LV IMPEDANCE VALUE: 560 Ohm
MDC IDC MSMT LEADCHNL RV PACING THRESHOLD AMPLITUDE: 0.625 V
MDC IDC MSMT LEADCHNL RV PACING THRESHOLD PULSEWIDTH: 0.5 ms
MDC IDC PG SERIAL: 7953752
MDC IDC SESS DTM: 20190918081552
MDC IDC SET LEADCHNL LV PACING AMPLITUDE: 2.5 V
MDC IDC SET LEADCHNL LV PACING PULSEWIDTH: 0.7 ms

## 2018-08-20 ENCOUNTER — Ambulatory Visit (INDEPENDENT_AMBULATORY_CARE_PROVIDER_SITE_OTHER): Payer: Medicare Other

## 2018-08-20 ENCOUNTER — Other Ambulatory Visit: Payer: Self-pay

## 2018-08-20 DIAGNOSIS — Z7901 Long term (current) use of anticoagulants: Secondary | ICD-10-CM | POA: Diagnosis not present

## 2018-08-20 DIAGNOSIS — I4891 Unspecified atrial fibrillation: Secondary | ICD-10-CM | POA: Diagnosis not present

## 2018-08-20 LAB — POCT INR: INR: 2.8 (ref 2.0–3.0)

## 2018-08-20 NOTE — Patient Instructions (Signed)
Description   Continue taking 1 tablet daily.  Recheck in 4 weeks.   Call our office if you are placed on any new medications or if you have any upcoming procedures [phone].

## 2018-08-20 NOTE — Telephone Encounter (Signed)
This is Dr. Bensimhon's pt 

## 2018-08-20 NOTE — Telephone Encounter (Signed)
Pt seen in Coumadin Clinic requesting refill of Viagra sent to [facility] on [address].  Thanks.

## 2018-09-16 ENCOUNTER — Encounter: Payer: Self-pay | Admitting: Internal Medicine

## 2018-09-16 ENCOUNTER — Ambulatory Visit (INDEPENDENT_AMBULATORY_CARE_PROVIDER_SITE_OTHER): Payer: Medicare Other | Admitting: *Deleted

## 2018-09-16 ENCOUNTER — Ambulatory Visit (INDEPENDENT_AMBULATORY_CARE_PROVIDER_SITE_OTHER): Payer: Medicare Other | Admitting: Internal Medicine

## 2018-09-16 VITALS — BP 114/58 | HR 70 | Ht 73.0 in | Wt 224.0 lb

## 2018-09-16 DIAGNOSIS — Z950 Presence of cardiac pacemaker: Secondary | ICD-10-CM | POA: Diagnosis not present

## 2018-09-16 DIAGNOSIS — I5022 Chronic systolic (congestive) heart failure: Secondary | ICD-10-CM

## 2018-09-16 DIAGNOSIS — I4891 Unspecified atrial fibrillation: Secondary | ICD-10-CM | POA: Diagnosis not present

## 2018-09-16 DIAGNOSIS — Z7901 Long term (current) use of anticoagulants: Secondary | ICD-10-CM

## 2018-09-16 DIAGNOSIS — I480 Paroxysmal atrial fibrillation: Secondary | ICD-10-CM

## 2018-09-16 DIAGNOSIS — I442 Atrioventricular block, complete: Secondary | ICD-10-CM

## 2018-09-16 LAB — CUP PACEART INCLINIC DEVICE CHECK
Battery Remaining Longevity: 87 mo
Battery Voltage: 2.99 V
Brady Statistic RA Percent Paced: 0 %
Brady Statistic RV Percent Paced: 97 %
Date Time Interrogation Session: 20191204140238
Implantable Lead Implant Date: 20060828
Implantable Lead Implant Date: 20060828
Implantable Lead Implant Date: 20110209
Implantable Lead Location: 753858
Implantable Lead Location: 753859
Implantable Lead Location: 753860
Implantable Lead Model: 4194
Implantable Lead Model: 5076
Implantable Pulse Generator Implant Date: 20171208
Lead Channel Impedance Value: 425 Ohm
Lead Channel Impedance Value: 675 Ohm
Lead Channel Pacing Threshold Amplitude: 1 V
Lead Channel Pacing Threshold Pulse Width: 0.5 ms
Lead Channel Pacing Threshold Pulse Width: 0.7 ms
Lead Channel Sensing Intrinsic Amplitude: 12 mV
Lead Channel Setting Pacing Amplitude: 2 V
Lead Channel Setting Pacing Amplitude: 2.5 V
Lead Channel Setting Pacing Pulse Width: 0.5 ms
Lead Channel Setting Pacing Pulse Width: 0.7 ms
Lead Channel Setting Sensing Sensitivity: 4 mV
MDC IDC MSMT LEADCHNL RV PACING THRESHOLD AMPLITUDE: 0.75 V
Pulse Gen Model: 3222
Pulse Gen Serial Number: 7953752

## 2018-09-16 LAB — POCT INR: INR: 3 (ref 2.0–3.0)

## 2018-09-16 NOTE — Patient Instructions (Signed)
Description   Continue taking 1 tablet daily.  Recheck in 6 weeks.   Call our office if you are placed on any new medications or if you have any upcoming procedures [phone].

## 2018-09-16 NOTE — Progress Notes (Signed)
HPI [name] returns today for ongoing evaluation and management of his pacemaker, chronic atrial fibrillation, complete heart block, and chronic systolic heart failure.  In the interim he is done well. He is on maximal medical therapy.  He has not been in the hospital and denies peripheral edema.  No chest pain or shortness of breath.  No syncope. No Known Allergies   Current Outpatient Medications  Medication Sig Dispense Refill  . allopurinol (ZYLOPRIM) 300 MG tablet Take 300 mg by mouth daily.      . carvedilol (COREG) 12.5 MG tablet Take 1 tablet (12.5 mg total) by mouth 2 (two) times daily. 30 tablet 6  . digoxin (LANOXIN) 0.125 MG tablet Take 0.125 mg by mouth daily.    . ferrous sulfate 325 (65 FE) MG tablet Take 325 mg by mouth daily with breakfast.     . furosemide (LASIX) 80 MG tablet Take 1 tablet (80 mg total) by mouth daily with breakfast. Can take an extra 40 mg as needed for swelling 30 tablet 6  . hydrALAZINE (APRESOLINE) 50 MG tablet Take 50 mg by mouth 3 (three) times daily.    Marland Kitchen HYDROPHILIC EX Apply 1 application topically daily as needed (For dry skin/heels).     . hydroxyurea (HYDREA) 500 MG capsule Take 500 mg by mouth daily. May take with food to minimize GI side effects.    Marland Kitchen LANTUS SOLOSTAR 100 UNIT/ML Solostar Pen Inject 14 Units as directed daily.    . metFORMIN (GLUCOPHAGE-XR) 500 MG 24 hr tablet Take 1,000 mg by mouth daily.    . sacubitril-valsartan (ENTRESTO) 24-26 MG Take 1 tablet by mouth 2 (two) times daily.    . sildenafil (VIAGRA) 100 MG tablet TAKE 1 TABLET BY MOUTH EVERY DAY AS NEEDED FOR ERECTILE DYSFUNCTION 10 tablet 2  . simvastatin (ZOCOR) 20 MG tablet Take 20 mg by mouth at bedtime.    Marland Kitchen spironolactone (ALDACTONE) 25 MG tablet Take 1 tablet (25 mg total) by mouth daily. 30 tablet 6  . terazosin (HYTRIN) 2 MG capsule Take 2 mg by mouth at bedtime. For help with urination    . warfarin (COUMADIN) 6 MG tablet TAKE 1 TABLET DAILY EXCEPT  ONE-HALF (1/2) TABLET ON MONDAY OR AS DIRECTED BY COUMADIN CLINIC (100 TABLETS IS A 90 DAY SUPPLY) 100 tablet 1   No current facility-administered medications for this visit.      Past Medical History:  Diagnosis Date  . Atrial fibrillation (HCC)    Chronic. On coumadin.  . CHF (congestive heart failure) ([place])    secondary to nonischemic cardiomyopathy. EF initially 20-25% up to 50% in 2008. Echo 2/12 EF 20-25% . RHC 3/12: RA 21  RV 81/16/24  PA 81/36 (56) PCWP 33 (v to 43) PA sat 43% 48%.status post Medtronic biventricular ICD placement.(with Sprint Fidelis leads)-explanted--now with St.Jude Anthem BivPM   . Coronary artery disease, non-occlusive    Cath 3/12: mLAD 70%, m-dLAD 40%; Dx 60%; mCFX 30-40%; pRCA 30%)  . Diabetes mellitus   . Hyperlipidemia   . Hypertension   . Obesities, morbid ([place])   . Polycythemia    vera requiring phlebotomy and treatment with hydroxyurea on a chronic bais. The patient followed by the [facility] for this.  . 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   Past Surgical History:  Procedure Laterality Date  . CARDIAC  CATHETERIZATION    . EP IMPLANTABLE DEVICE N/A 09/20/2016   Procedure: BiV Pacemaker Generator Changeout;  Surgeon: Evans Lance, MD;  Location: [facility];  Service: Cardiovascular;  Laterality: N/A;  . INSERT / REPLACE / REMOVE PACEMAKER     ICD - St. Jude     Family History  Family history unknown: Yes     Social History   Socioeconomic History  . Marital status: Married    Spouse name: Not on file  . Number of children: Not on file  . Years of education: Not on file  . Highest education level: Not on file  Occupational History  . Not on file  Social Needs  . Financial resource strain: Not on file  . Food insecurity:    Worry: Not on file    Inability: Not on file  . Transportation needs:    Medical: Not on file     Non-medical: Not on file  Tobacco Use  . Smoking status: Former Research scientist (life sciences)  . Smokeless tobacco: Never Used  Substance and Sexual Activity  . Alcohol use: No  . Drug use: No  . Sexual activity: Not on file  Lifestyle  . Physical activity:    Days per week: Not on file    Minutes per session: Not on file  . Stress: Not on file  Relationships  . Social connections:    Talks on phone: Not on file    Gets together: Not on file    Attends religious service: Not on file    Active member of club or organization: Not on file    Attends meetings of clubs or organizations: Not on file    Relationship status: Not on file  . Intimate partner violence:    Fear of current or ex partner: Not on file    Emotionally abused: Not on file    Physically abused: Not on file    Forced sexual activity: Not on file  Other Topics Concern  . Not on file  Social History Narrative   Retired   Married    Tobacco Use - No   Alcohol Use - No     BP (!) 114/58   Pulse 70   Ht 6\' 1"  (1.854 m)   Wt 224 lb (101.6 kg)   BMI 29.55 kg/m   Physical Exam:  Well appearing NAD HEENT: Unremarkable Neck:  No JVD, no thyromegally Lymphatics:  No adenopathy Back:  No CVA tenderness Lungs:  Clear with no wheezes HEART:  Regular rate rhythm, no murmurs, no rubs, no clicks Abd:  soft, positive bowel sounds, no organomegally, no rebound, no guarding Ext:  2 plus pulses, no edema, no cyanosis, no clubbing Skin:  No rashes no nodules Neuro:  CN II through XII intact, motor grossly intact  EKG - atrial fib with biv pacing  DEVICE  Normal device function.  See PaceArt for details.   Assess/Plan: 1. Atrial fib - his ventricular rate is well controlled and he is tolerating his systemic anti-coagulation.  2. Chronic systolic heart failure - his symptoms are class 2. He will continue his current meds. 3. PPM - his St. Jude Biv PPM is working normally.  4. HTN - his blood pressure is well controlled. No change in  his meds.  Mikle Bosworth.D.

## 2018-09-16 NOTE — Patient Instructions (Signed)
Medication Instructions:  Your physician recommends that you continue on your current medications as directed. Please refer to the Current Medication list given to you today.  Labwork: None ordered.  Testing/Procedures: None ordered.  Follow-Up: Your physician wants you to follow-up in: one year with Dr. Lovena Le.   You will receive a reminder letter in the mail two months in advance. If you don't receive a letter, please call our office to schedule the follow-up appointment.  Remote monitoring is used to monitor your Pacemaker from home. This monitoring reduces the number of office visits required to check your device to one time per year. It allows [place] to keep an eye on the functioning of your device to ensure it is working properly. You are scheduled for a device check from home on 09/30/2018. You may send your transmission at any time that day. If you have a wireless device, the transmission will be sent automatically. After your physician reviews your transmission, you will receive a postcard with your next transmission date.  Any Other Special Instructions Will Be Listed Below (If Applicable).  If you need a refill on your cardiac medications before your next appointment, please call your pharmacy.

## 2018-09-23 ENCOUNTER — Other Ambulatory Visit: Payer: Self-pay | Admitting: Internal Medicine

## 2018-09-30 ENCOUNTER — Ambulatory Visit (INDEPENDENT_AMBULATORY_CARE_PROVIDER_SITE_OTHER): Payer: Medicare Other

## 2018-09-30 DIAGNOSIS — I442 Atrioventricular block, complete: Secondary | ICD-10-CM | POA: Diagnosis not present

## 2018-09-30 DIAGNOSIS — I5022 Chronic systolic (congestive) heart failure: Secondary | ICD-10-CM

## 2018-09-30 NOTE — Progress Notes (Signed)
Remote pacemaker transmission.   

## 2018-10-01 ENCOUNTER — Encounter: Payer: Self-pay | Admitting: Cardiology

## 2018-10-18 LAB — CUP PACEART REMOTE DEVICE CHECK
Battery Remaining Longevity: 92 mo
Battery Remaining Percentage: 95.5 %
Battery Voltage: 2.99 V
Date Time Interrogation Session: 20191218114134
Implantable Lead Implant Date: 20060828
Implantable Lead Implant Date: 20060828
Implantable Lead Location: 753858
Implantable Lead Location: 753860
Implantable Lead Model: 4194
Implantable Pulse Generator Implant Date: 20171208
Lead Channel Impedance Value: 450 Ohm
Lead Channel Impedance Value: 640 Ohm
Lead Channel Pacing Threshold Amplitude: 0.625 V
Lead Channel Pacing Threshold Amplitude: 1 V
Lead Channel Pacing Threshold Pulse Width: 0.7 ms
Lead Channel Sensing Intrinsic Amplitude: 12 mV
Lead Channel Setting Pacing Amplitude: 2 V
Lead Channel Setting Pacing Pulse Width: 0.5 ms
Lead Channel Setting Pacing Pulse Width: 0.7 ms
Lead Channel Setting Sensing Sensitivity: 4 mV
MDC IDC LEAD IMPLANT DT: 20110209
MDC IDC LEAD LOCATION: 753859
MDC IDC MSMT LEADCHNL RV PACING THRESHOLD PULSEWIDTH: 0.5 ms
MDC IDC SET LEADCHNL LV PACING AMPLITUDE: 2.5 V
Pulse Gen Model: 3222
Pulse Gen Serial Number: 7953752

## 2018-10-29 ENCOUNTER — Ambulatory Visit (INDEPENDENT_AMBULATORY_CARE_PROVIDER_SITE_OTHER): Payer: Medicare Other | Admitting: Pharmacist

## 2018-10-29 ENCOUNTER — Encounter (INDEPENDENT_AMBULATORY_CARE_PROVIDER_SITE_OTHER): Payer: Self-pay

## 2018-10-29 DIAGNOSIS — I4891 Unspecified atrial fibrillation: Secondary | ICD-10-CM | POA: Diagnosis not present

## 2018-10-29 DIAGNOSIS — Z7901 Long term (current) use of anticoagulants: Secondary | ICD-10-CM | POA: Diagnosis not present

## 2018-10-29 LAB — POCT INR: INR: 3.1 — AB (ref 2.0–3.0)

## 2018-10-29 NOTE — Patient Instructions (Signed)
Take 1/2 tablet tonight then continue taking 1 tablet daily.  Recheck in 6 weeks.   Call our office if you are placed on any new medications or if you have any upcoming procedures [phone].

## 2018-12-10 ENCOUNTER — Ambulatory Visit (INDEPENDENT_AMBULATORY_CARE_PROVIDER_SITE_OTHER): Payer: Medicare Other | Admitting: *Deleted

## 2018-12-10 DIAGNOSIS — Z7901 Long term (current) use of anticoagulants: Secondary | ICD-10-CM

## 2018-12-10 DIAGNOSIS — I4891 Unspecified atrial fibrillation: Secondary | ICD-10-CM | POA: Diagnosis not present

## 2018-12-10 LAB — POCT INR: INR: 3.2 — AB (ref 2.0–3.0)

## 2018-12-10 NOTE — Patient Instructions (Addendum)
Description   Take 1/2 tablet today then start taking 1 tablet daily except 1/2 tablet on Thursdays.  Recheck in 2 weeks.   Call our office if you are placed on any new medications or if you have any upcoming procedures [phone].

## 2018-12-18 ENCOUNTER — Encounter: Payer: Self-pay | Admitting: Nurse Practitioner

## 2018-12-18 NOTE — Progress Notes (Signed)
[facility] alert received for 3 HVR episodes, longest 48 beats. Pt with underlying complete heart block. Routed to Dr Lovena Le for review. Pt has had similar HVR episodes in the past. Known permanent AF.    Chanetta Marshall, NP 12/18/2018 8:39 AM Alerts adjusted - non actionable per Dr Emi Holes, NP 12/20/2018 2:01 PM

## 2018-12-19 NOTE — Progress Notes (Signed)
He has a long h/o this. No new treatment. GT

## 2018-12-24 ENCOUNTER — Ambulatory Visit (INDEPENDENT_AMBULATORY_CARE_PROVIDER_SITE_OTHER): Payer: Medicare Other | Admitting: Pharmacist

## 2018-12-24 ENCOUNTER — Other Ambulatory Visit: Payer: Self-pay

## 2018-12-24 DIAGNOSIS — Z7901 Long term (current) use of anticoagulants: Secondary | ICD-10-CM

## 2018-12-24 DIAGNOSIS — I4891 Unspecified atrial fibrillation: Secondary | ICD-10-CM | POA: Diagnosis not present

## 2018-12-24 LAB — POCT INR
INR: 2.8 (ref 2.0–3.0)
INR: 2.8 (ref 2.0–3.0)

## 2018-12-24 NOTE — Patient Instructions (Signed)
Continue taking 1 tablet daily except 1/2 tablet on Thursdays.  Recheck in 3 weeks.   Call our office if you are placed on any new medications or if you have any upcoming procedures [phone].

## 2018-12-30 ENCOUNTER — Other Ambulatory Visit: Payer: Self-pay

## 2018-12-30 ENCOUNTER — Ambulatory Visit (INDEPENDENT_AMBULATORY_CARE_PROVIDER_SITE_OTHER): Payer: Medicare Other | Admitting: *Deleted

## 2018-12-30 DIAGNOSIS — Z950 Presence of cardiac pacemaker: Secondary | ICD-10-CM

## 2018-12-30 DIAGNOSIS — I442 Atrioventricular block, complete: Secondary | ICD-10-CM | POA: Diagnosis not present

## 2018-12-30 DIAGNOSIS — I5022 Chronic systolic (congestive) heart failure: Secondary | ICD-10-CM

## 2018-12-30 LAB — CUP PACEART REMOTE DEVICE CHECK
Battery Remaining Longevity: 92 mo
Battery Remaining Percentage: 95.5 %
Battery Voltage: 2.99 V
Implantable Lead Implant Date: 20060828
Implantable Lead Implant Date: 20110209
Implantable Lead Location: 753858
Implantable Lead Location: 753859
Implantable Lead Location: 753860
Implantable Lead Model: 4194
Implantable Lead Model: 5076
Implantable Pulse Generator Implant Date: 20171208
Lead Channel Impedance Value: 450 Ohm
Lead Channel Impedance Value: 610 Ohm
Lead Channel Pacing Threshold Amplitude: 0.625 V
Lead Channel Pacing Threshold Amplitude: 1 V
Lead Channel Pacing Threshold Pulse Width: 0.5 ms
Lead Channel Pacing Threshold Pulse Width: 0.7 ms
Lead Channel Sensing Intrinsic Amplitude: 12 mV
Lead Channel Setting Pacing Amplitude: 2 V
Lead Channel Setting Pacing Amplitude: 2.5 V
Lead Channel Setting Pacing Pulse Width: 0.5 ms
Lead Channel Setting Pacing Pulse Width: 0.7 ms
Lead Channel Setting Sensing Sensitivity: 4 mV
MDC IDC LEAD IMPLANT DT: 20060828
MDC IDC SESS DTM: 20200318094112
Pulse Gen Model: 3222
Pulse Gen Serial Number: 7953752

## 2019-01-07 ENCOUNTER — Encounter: Payer: Self-pay | Admitting: Cardiology

## 2019-01-07 NOTE — Progress Notes (Signed)
Remote pacemaker transmission.   

## 2019-01-13 ENCOUNTER — Telehealth: Payer: Self-pay

## 2019-01-13 NOTE — Telephone Encounter (Signed)
Called to screen for the covid virus and the patient asked to be rescheduled to 01/19/19 and made aware of the drive up

## 2019-01-18 ENCOUNTER — Telehealth: Payer: Self-pay

## 2019-01-18 NOTE — Telephone Encounter (Signed)

## 2019-01-19 ENCOUNTER — Other Ambulatory Visit: Payer: Self-pay

## 2019-01-19 ENCOUNTER — Ambulatory Visit (INDEPENDENT_AMBULATORY_CARE_PROVIDER_SITE_OTHER): Payer: Medicare Other | Admitting: *Deleted

## 2019-01-19 DIAGNOSIS — Z7901 Long term (current) use of anticoagulants: Secondary | ICD-10-CM

## 2019-01-19 DIAGNOSIS — I4891 Unspecified atrial fibrillation: Secondary | ICD-10-CM

## 2019-01-19 LAB — POCT INR: INR: 5 — AB (ref 2.0–3.0)

## 2019-01-28 ENCOUNTER — Telehealth: Payer: Self-pay

## 2019-01-28 NOTE — Telephone Encounter (Signed)
lmom for prescreen  

## 2019-01-29 ENCOUNTER — Ambulatory Visit (INDEPENDENT_AMBULATORY_CARE_PROVIDER_SITE_OTHER): Payer: Medicare Other

## 2019-01-29 ENCOUNTER — Other Ambulatory Visit: Payer: Self-pay

## 2019-01-29 DIAGNOSIS — I4891 Unspecified atrial fibrillation: Secondary | ICD-10-CM | POA: Diagnosis not present

## 2019-01-29 DIAGNOSIS — Z7901 Long term (current) use of anticoagulants: Secondary | ICD-10-CM

## 2019-01-29 LAB — POCT INR: INR: 3.8 — AB (ref 2.0–3.0)

## 2019-01-29 NOTE — Patient Instructions (Signed)
Description   Called spoke with pt's wife who comes to pt's appts with him, advised to have pt skip today's dosage of Coumadin, then start taking 1 tablet daily except 1/2 tablet on Sundays and Thursdays.  Recheck in 2 weeks.  Call our office if you are placed on any new medications or if you have any upcoming procedures [phone].

## 2019-02-10 ENCOUNTER — Telehealth: Payer: Self-pay

## 2019-02-10 NOTE — Telephone Encounter (Signed)

## 2019-02-11 ENCOUNTER — Other Ambulatory Visit: Payer: Self-pay

## 2019-02-11 ENCOUNTER — Ambulatory Visit (INDEPENDENT_AMBULATORY_CARE_PROVIDER_SITE_OTHER): Payer: Medicare Other | Admitting: Pharmacist

## 2019-02-11 DIAGNOSIS — I4891 Unspecified atrial fibrillation: Secondary | ICD-10-CM

## 2019-02-11 DIAGNOSIS — Z7901 Long term (current) use of anticoagulants: Secondary | ICD-10-CM

## 2019-02-11 LAB — POCT INR: INR: 2.9 (ref 2.0–3.0)

## 2019-03-05 ENCOUNTER — Telehealth: Payer: Self-pay

## 2019-03-05 NOTE — Telephone Encounter (Signed)

## 2019-03-09 ENCOUNTER — Other Ambulatory Visit: Payer: Self-pay

## 2019-03-09 ENCOUNTER — Ambulatory Visit (INDEPENDENT_AMBULATORY_CARE_PROVIDER_SITE_OTHER): Payer: Medicare Other | Admitting: Pharmacist

## 2019-03-09 DIAGNOSIS — Z7901 Long term (current) use of anticoagulants: Secondary | ICD-10-CM

## 2019-03-09 DIAGNOSIS — I5043 Acute on chronic combined systolic (congestive) and diastolic (congestive) heart failure: Secondary | ICD-10-CM

## 2019-03-09 DIAGNOSIS — I4891 Unspecified atrial fibrillation: Secondary | ICD-10-CM

## 2019-03-09 LAB — POCT INR: INR: 2.5 (ref 2.0–3.0)

## 2019-03-31 ENCOUNTER — Ambulatory Visit (INDEPENDENT_AMBULATORY_CARE_PROVIDER_SITE_OTHER): Payer: Medicare Other | Admitting: *Deleted

## 2019-03-31 DIAGNOSIS — I442 Atrioventricular block, complete: Secondary | ICD-10-CM | POA: Diagnosis not present

## 2019-03-31 DIAGNOSIS — I5022 Chronic systolic (congestive) heart failure: Secondary | ICD-10-CM

## 2019-03-31 LAB — CUP PACEART REMOTE DEVICE CHECK
Battery Remaining Longevity: 92 mo
Battery Remaining Percentage: 95.5 %
Battery Voltage: 2.98 V
Date Time Interrogation Session: 20200617102455
Implantable Lead Implant Date: 20060828
Implantable Lead Implant Date: 20060828
Implantable Lead Implant Date: 20110209
Implantable Lead Location: 753858
Implantable Lead Location: 753859
Implantable Lead Location: 753860
Implantable Lead Model: 4194
Implantable Lead Model: 5076
Implantable Pulse Generator Implant Date: 20171208
Lead Channel Impedance Value: 430 Ohm
Lead Channel Impedance Value: 610 Ohm
Lead Channel Pacing Threshold Amplitude: 0.625 V
Lead Channel Pacing Threshold Amplitude: 1 V
Lead Channel Pacing Threshold Pulse Width: 0.5 ms
Lead Channel Pacing Threshold Pulse Width: 0.7 ms
Lead Channel Sensing Intrinsic Amplitude: 12 mV
Lead Channel Setting Pacing Amplitude: 2 V
Lead Channel Setting Pacing Amplitude: 2.5 V
Lead Channel Setting Pacing Pulse Width: 0.5 ms
Lead Channel Setting Pacing Pulse Width: 0.7 ms
Lead Channel Setting Sensing Sensitivity: 4 mV
Pulse Gen Model: 3222
Pulse Gen Serial Number: 7953752

## 2019-04-13 ENCOUNTER — Telehealth: Payer: Self-pay

## 2019-04-13 NOTE — Telephone Encounter (Signed)
Unable to lmom for prescreen  

## 2019-04-14 ENCOUNTER — Encounter: Payer: Self-pay | Admitting: Cardiology

## 2019-04-14 NOTE — Progress Notes (Signed)
Remote pacemaker transmission.   

## 2019-04-19 NOTE — Telephone Encounter (Signed)

## 2019-04-20 ENCOUNTER — Ambulatory Visit (INDEPENDENT_AMBULATORY_CARE_PROVIDER_SITE_OTHER): Payer: Medicare Other | Admitting: *Deleted

## 2019-04-20 ENCOUNTER — Other Ambulatory Visit: Payer: Self-pay

## 2019-04-20 DIAGNOSIS — I4891 Unspecified atrial fibrillation: Secondary | ICD-10-CM

## 2019-04-20 DIAGNOSIS — Z7901 Long term (current) use of anticoagulants: Secondary | ICD-10-CM

## 2019-04-20 LAB — POCT INR: INR: 2.4 (ref 2.0–3.0)

## 2019-04-20 NOTE — Patient Instructions (Signed)
Description   Continue taking 1 tablet daily except 1/2 tablet on Sundays and Thursdays.  Recheck in 6 weeks.  Call our office if you are placed on any new medications or if you have any upcoming procedures [phone].

## 2019-05-12 ENCOUNTER — Other Ambulatory Visit: Payer: Self-pay | Admitting: *Deleted

## 2019-05-12 MED ORDER — WARFARIN SODIUM 6 MG PO TABS: ORAL_TABLET | ORAL | 1 refills | Status: DC

## 2019-06-01 ENCOUNTER — Ambulatory Visit (INDEPENDENT_AMBULATORY_CARE_PROVIDER_SITE_OTHER): Payer: Medicare Other | Admitting: *Deleted

## 2019-06-01 ENCOUNTER — Other Ambulatory Visit: Payer: Self-pay

## 2019-06-01 DIAGNOSIS — Z7901 Long term (current) use of anticoagulants: Secondary | ICD-10-CM | POA: Diagnosis not present

## 2019-06-01 DIAGNOSIS — I4891 Unspecified atrial fibrillation: Secondary | ICD-10-CM | POA: Diagnosis not present

## 2019-06-01 LAB — POCT INR: INR: 1.9 — AB (ref 2.0–3.0)

## 2019-06-01 NOTE — Patient Instructions (Signed)
Description   Today take 1.5 tablets then continue taking 1 tablet daily except 1/2 tablet on Sundays and Thursdays.  Recheck in 6 weeks.  Call our office if you are placed on any new medications or if you have any upcoming procedures [phone].

## 2019-06-07 ENCOUNTER — Emergency Department (HOSPITAL_COMMUNITY)
Admission: EM | Admit: 2019-06-07 | Discharge: 2019-06-08 | Disposition: A | Discharge status: Home / Self Care | Payer: Medicare Other | Source: Home / Self Care

## 2019-06-07 ENCOUNTER — Other Ambulatory Visit: Payer: Self-pay

## 2019-06-07 ENCOUNTER — Encounter (HOSPITAL_COMMUNITY): Payer: Self-pay | Admitting: Emergency Medicine

## 2019-06-07 ENCOUNTER — Encounter (HOSPITAL_COMMUNITY): Payer: Self-pay

## 2019-06-07 ENCOUNTER — Telehealth: Payer: Self-pay | Admitting: Internal Medicine

## 2019-06-07 ENCOUNTER — Emergency Department (HOSPITAL_COMMUNITY)
Admission: EM | Admit: 2019-06-07 | Discharge: 2019-06-07 | Disposition: A | Discharge status: Home / Self Care | Payer: Medicare Other | Attending: Emergency Medicine | Admitting: Emergency Medicine

## 2019-06-07 DIAGNOSIS — Z5321 Procedure and treatment not carried out due to patient leaving prior to being seen by health care provider: Secondary | ICD-10-CM | POA: Insufficient documentation

## 2019-06-07 DIAGNOSIS — M545 Low back pain: Secondary | ICD-10-CM | POA: Diagnosis present

## 2019-06-07 LAB — COMPREHENSIVE METABOLIC PANEL
ALT: 20 U/L (ref 0–44)
AST: 21 U/L (ref 15–41)
Albumin: 3.9 g/dL (ref 3.5–5.0)
Alkaline Phosphatase: 69 U/L (ref 38–126)
Anion gap: 12 (ref 5–15)
BUN: 19 mg/dL (ref 8–23)
CO2: 23 mmol/L (ref 22–32)
Calcium: 9.2 mg/dL (ref 8.9–10.3)
Chloride: 100 mmol/L (ref 98–111)
Creatinine, Ser: 1.18 mg/dL (ref 0.61–1.24)
GFR calc Af Amer: >60 mL/min (ref >60)
GFR calc non Af Amer: 58 mL/min — ABNORMAL LOW (ref >60)
Glucose, Bld: 163 mg/dL — ABNORMAL HIGH (ref 70–99)
Potassium: 4.6 mmol/L (ref 3.5–5.1)
Sodium: 135 mmol/L (ref 135–145)
Total Bilirubin: 1.5 mg/dL — ABNORMAL HIGH (ref 0.3–1.2)
Total Protein: 7.4 g/dL (ref 6.5–8.1)

## 2019-06-07 LAB — CBC
HCT: 44.6 % (ref 39.0–52.0)
Hemoglobin: 14.7 g/dL (ref 13.0–17.0)
MCH: 37.1 pg — ABNORMAL HIGH (ref 26.0–34.0)
MCHC: 33 g/dL (ref 30.0–36.0)
MCV: 112.6 fL — ABNORMAL HIGH (ref 80.0–100.0)
Platelets: 357 10*3/uL (ref 150–400)
RBC: 3.96 MIL/uL — ABNORMAL LOW (ref 4.22–5.81)
RDW: 14.1 % (ref 11.5–15.5)
WBC: 12 10*3/uL — ABNORMAL HIGH (ref 4.0–10.5)
nRBC: 0 % (ref 0.0–0.2)

## 2019-06-07 LAB — LIPASE, BLOOD: Lipase: 18 U/L (ref 11–51)

## 2019-06-07 MED ORDER — SODIUM CHLORIDE 0.9% FLUSH: 3.0000 mL | Freq: Once | INTRAVENOUS | Status: DC

## 2019-06-07 NOTE — Telephone Encounter (Signed)
  Patient is having back pain and urinating a lot and and he would like to speak to a nurse. He states that they were told to call Dr Lovena Le

## 2019-06-07 NOTE — ED Triage Notes (Signed)
Patient reports low back pain with polyuria and oliguria onset yesterday , denies injury or fall.

## 2019-06-07 NOTE — Telephone Encounter (Signed)
Attempted to return call.  No answer and did not go to a VM.  DPR states do not leave messages.  Pt with back pain and frequent urination which suggests he has a kidney infection.  Will attempt to call tomorrow.  Refer to PCP/urgent care.

## 2019-06-07 NOTE — ED Triage Notes (Signed)
Pt reports lower back pain since yesterday and states he is note urinating as much as he should. Pt poor historian. Alert, nad noted.

## 2019-06-07 NOTE — ED Notes (Signed)
Pt states wife, "I need to lay down, you got to take me home." He was encouraged to stay 3 times by myself and family. Pt declines.

## 2019-06-08 ENCOUNTER — Telehealth (HOSPITAL_COMMUNITY): Payer: Self-pay | Admitting: Cardiology

## 2019-06-08 NOTE — ED Notes (Signed)
Pt did not respond for vitals update

## 2019-06-08 NOTE — ED Notes (Signed)
Pt does not respond for vitals when called

## 2019-06-08 NOTE — Telephone Encounter (Signed)
Per review of Pt's medical chart, Pt went to ER but left AMA.  Pt called heart failure office this morning and was advised to follow up with PCP.  No action needed at this time.

## 2019-06-08 NOTE — Telephone Encounter (Signed)
Patient/paitents wife called with concerns of back pain and infrequent urination. Patient does not weigh daily, denies SOB, CP, cough, swelling, difficulty sleeping at night, increased fatigue/ sluggishness.  Patient reports to taking medications daily  Advised above concerns do not seem cardiac/ HF in nature should call PCP further further work up of back pain and decreased urination. May take the 40 mg of Lasix if needed for fluid as ordered per Dr Haroldine Laws.   Voiced understanding and nothing further needed at this time

## 2019-06-30 ENCOUNTER — Ambulatory Visit (INDEPENDENT_AMBULATORY_CARE_PROVIDER_SITE_OTHER): Payer: Medicare Other | Admitting: *Deleted

## 2019-06-30 DIAGNOSIS — I442 Atrioventricular block, complete: Secondary | ICD-10-CM

## 2019-06-30 DIAGNOSIS — I5022 Chronic systolic (congestive) heart failure: Secondary | ICD-10-CM

## 2019-06-30 LAB — CUP PACEART REMOTE DEVICE CHECK
Battery Remaining Longevity: 94 mo
Battery Remaining Percentage: 95.5 %
Battery Voltage: 2.99 V
Date Time Interrogation Session: 20200916060023
Implantable Lead Implant Date: 20060828
Implantable Lead Implant Date: 20060828
Implantable Lead Implant Date: 20110209
Implantable Lead Location: 753858
Implantable Lead Location: 753859
Implantable Lead Location: 753860
Implantable Lead Model: 4194
Implantable Lead Model: 5076
Implantable Pulse Generator Implant Date: 20171208
Lead Channel Impedance Value: 460 Ohm
Lead Channel Impedance Value: 630 Ohm
Lead Channel Pacing Threshold Amplitude: 0.5 V
Lead Channel Pacing Threshold Amplitude: 1 V
Lead Channel Pacing Threshold Pulse Width: 0.5 ms
Lead Channel Pacing Threshold Pulse Width: 0.7 ms
Lead Channel Sensing Intrinsic Amplitude: 12 mV
Lead Channel Setting Pacing Amplitude: 2 V
Lead Channel Setting Pacing Amplitude: 2.5 V
Lead Channel Setting Pacing Pulse Width: 0.5 ms
Lead Channel Setting Pacing Pulse Width: 0.7 ms
Lead Channel Setting Sensing Sensitivity: 4 mV
Pulse Gen Model: 3222
Pulse Gen Serial Number: 7953752

## 2019-07-06 ENCOUNTER — Encounter: Payer: Self-pay | Admitting: Cardiology

## 2019-07-06 NOTE — Progress Notes (Signed)
Remote pacemaker transmission.   

## 2019-07-13 ENCOUNTER — Other Ambulatory Visit: Payer: Self-pay

## 2019-07-13 ENCOUNTER — Encounter (INDEPENDENT_AMBULATORY_CARE_PROVIDER_SITE_OTHER): Payer: Self-pay

## 2019-07-13 ENCOUNTER — Ambulatory Visit (INDEPENDENT_AMBULATORY_CARE_PROVIDER_SITE_OTHER): Payer: Medicare Other | Admitting: *Deleted

## 2019-07-13 DIAGNOSIS — I4891 Unspecified atrial fibrillation: Secondary | ICD-10-CM

## 2019-07-13 DIAGNOSIS — Z7901 Long term (current) use of anticoagulants: Secondary | ICD-10-CM | POA: Diagnosis not present

## 2019-07-13 LAB — POCT INR: INR: 3.6 — AB (ref 2.0–3.0)

## 2019-07-13 NOTE — Patient Instructions (Signed)
Description   Hold today, then continue taking 1 tablet daily except 1/2 tablet on Sundays and Thursdays.  Recheck in 3 weeks.  Call our office if you are placed on any new medications or if you have any upcoming procedures [phone].

## 2019-08-04 ENCOUNTER — Other Ambulatory Visit: Payer: Self-pay

## 2019-08-04 ENCOUNTER — Ambulatory Visit (INDEPENDENT_AMBULATORY_CARE_PROVIDER_SITE_OTHER): Payer: Medicare Other | Admitting: *Deleted

## 2019-08-04 DIAGNOSIS — Z7901 Long term (current) use of anticoagulants: Secondary | ICD-10-CM | POA: Diagnosis not present

## 2019-08-04 DIAGNOSIS — I4891 Unspecified atrial fibrillation: Secondary | ICD-10-CM

## 2019-08-04 LAB — POCT INR: INR: 2.1 (ref 2.0–3.0)

## 2019-08-04 NOTE — Patient Instructions (Signed)
Description   Continue taking 1 tablet daily except 1/2 tablet on Sundays and Thursdays.  Recheck in 4 weeks.  Call our office if you are placed on any new medications or if you have any upcoming procedures [phone].

## 2019-09-01 ENCOUNTER — Ambulatory Visit (INDEPENDENT_AMBULATORY_CARE_PROVIDER_SITE_OTHER): Payer: Medicare Other

## 2019-09-01 ENCOUNTER — Other Ambulatory Visit: Payer: Self-pay

## 2019-09-01 DIAGNOSIS — I4891 Unspecified atrial fibrillation: Secondary | ICD-10-CM | POA: Diagnosis not present

## 2019-09-01 DIAGNOSIS — Z7901 Long term (current) use of anticoagulants: Secondary | ICD-10-CM

## 2019-09-01 LAB — POCT INR: INR: 3.1 — AB (ref 2.0–3.0)

## 2019-09-01 NOTE — Patient Instructions (Signed)
Description   Continue taking 1 tablet daily except 1/2 tablet on Sundays and Thursdays. Eat extra greens today or tomorrow. Recheck in 4 weeks.  Call our office if you are placed on any new medications or if you have any upcoming procedures [phone].

## 2019-09-29 ENCOUNTER — Ambulatory Visit (INDEPENDENT_AMBULATORY_CARE_PROVIDER_SITE_OTHER): Payer: Medicare Other | Admitting: *Deleted

## 2019-09-29 DIAGNOSIS — Z950 Presence of cardiac pacemaker: Secondary | ICD-10-CM | POA: Diagnosis not present

## 2019-09-30 LAB — CUP PACEART REMOTE DEVICE CHECK
Battery Remaining Longevity: 94 mo
Battery Remaining Percentage: 95.5 %
Battery Voltage: 2.98 V
Date Time Interrogation Session: 20201216113916
Implantable Lead Implant Date: 20060828
Implantable Lead Implant Date: 20060828
Implantable Lead Implant Date: 20110209
Implantable Lead Location: 753858
Implantable Lead Location: 753859
Implantable Lead Location: 753860
Implantable Lead Model: 4194
Implantable Lead Model: 5076
Implantable Pulse Generator Implant Date: 20171208
Lead Channel Impedance Value: 410 Ohm
Lead Channel Impedance Value: 650 Ohm
Lead Channel Pacing Threshold Amplitude: 0.625 V
Lead Channel Pacing Threshold Amplitude: 1 V
Lead Channel Pacing Threshold Pulse Width: 0.5 ms
Lead Channel Pacing Threshold Pulse Width: 0.7 ms
Lead Channel Sensing Intrinsic Amplitude: 12 mV
Lead Channel Setting Pacing Amplitude: 2 V
Lead Channel Setting Pacing Amplitude: 2.5 V
Lead Channel Setting Pacing Pulse Width: 0.5 ms
Lead Channel Setting Pacing Pulse Width: 0.7 ms
Lead Channel Setting Sensing Sensitivity: 4 mV
Pulse Gen Model: 3222
Pulse Gen Serial Number: 7953752

## 2019-10-04 ENCOUNTER — Ambulatory Visit (INDEPENDENT_AMBULATORY_CARE_PROVIDER_SITE_OTHER): Payer: Medicare Other

## 2019-10-04 ENCOUNTER — Other Ambulatory Visit: Payer: Self-pay

## 2019-10-04 DIAGNOSIS — Z7901 Long term (current) use of anticoagulants: Secondary | ICD-10-CM

## 2019-10-04 DIAGNOSIS — I4891 Unspecified atrial fibrillation: Secondary | ICD-10-CM | POA: Diagnosis not present

## 2019-10-04 LAB — POCT INR: INR: 2.9 (ref 2.0–3.0)

## 2019-10-04 NOTE — Patient Instructions (Signed)
Description   Continue taking 1 tablet daily except 1/2 tablet on Sundays and Thursdays.  Recheck in 4 weeks.  Call our office if you are placed on any new medications or if you have any upcoming procedures [phone].

## 2019-11-10 ENCOUNTER — Other Ambulatory Visit: Payer: Self-pay

## 2019-11-10 ENCOUNTER — Ambulatory Visit (INDEPENDENT_AMBULATORY_CARE_PROVIDER_SITE_OTHER): Payer: Medicare Other | Admitting: *Deleted

## 2019-11-10 DIAGNOSIS — I4891 Unspecified atrial fibrillation: Secondary | ICD-10-CM

## 2019-11-10 DIAGNOSIS — Z7901 Long term (current) use of anticoagulants: Secondary | ICD-10-CM

## 2019-11-10 LAB — POCT INR: INR: 1.6 — AB (ref 2.0–3.0)

## 2019-11-10 NOTE — Patient Instructions (Signed)
Description   Today take 1.5 tablets then continue taking 1 tablet daily except 1/2 tablet on Sundays and Thursdays.  Recheck in 3 weeks.  Call our office if you are placed on any new medications or if you have any upcoming procedures [phone].

## 2019-12-01 ENCOUNTER — Ambulatory Visit (INDEPENDENT_AMBULATORY_CARE_PROVIDER_SITE_OTHER): Payer: Medicare Other

## 2019-12-01 ENCOUNTER — Other Ambulatory Visit: Payer: Self-pay

## 2019-12-01 DIAGNOSIS — Z7901 Long term (current) use of anticoagulants: Secondary | ICD-10-CM

## 2019-12-01 DIAGNOSIS — I4891 Unspecified atrial fibrillation: Secondary | ICD-10-CM

## 2019-12-01 LAB — POCT INR: INR: 2.6 (ref 2.0–3.0)

## 2019-12-01 NOTE — Patient Instructions (Signed)
Description   Continue on same dosage 1 tablet daily except 1/2 tablet on Sundays and Thursdays.  Recheck in 4 weeks.  Call our office if you are placed on any new medications or if you have any upcoming procedures [phone].

## 2019-12-29 ENCOUNTER — Other Ambulatory Visit: Payer: Self-pay | Admitting: *Deleted

## 2019-12-29 ENCOUNTER — Ambulatory Visit (INDEPENDENT_AMBULATORY_CARE_PROVIDER_SITE_OTHER): Payer: Medicare Other | Admitting: *Deleted

## 2019-12-29 ENCOUNTER — Other Ambulatory Visit: Payer: Self-pay

## 2019-12-29 DIAGNOSIS — Z950 Presence of cardiac pacemaker: Secondary | ICD-10-CM | POA: Diagnosis not present

## 2019-12-29 DIAGNOSIS — Z7901 Long term (current) use of anticoagulants: Secondary | ICD-10-CM

## 2019-12-29 DIAGNOSIS — I4891 Unspecified atrial fibrillation: Secondary | ICD-10-CM

## 2019-12-29 DIAGNOSIS — Z5181 Encounter for therapeutic drug level monitoring: Secondary | ICD-10-CM

## 2019-12-29 LAB — CUP PACEART REMOTE DEVICE CHECK
Battery Remaining Longevity: 95 mo
Battery Remaining Percentage: 95.5 %
Battery Voltage: 2.98 V
Date Time Interrogation Session: 20210317020014
Implantable Lead Implant Date: 20060828
Implantable Lead Implant Date: 20060828
Implantable Lead Implant Date: 20110209
Implantable Lead Location: 753858
Implantable Lead Location: 753859
Implantable Lead Location: 753860
Implantable Lead Model: 4194
Implantable Lead Model: 5076
Implantable Pulse Generator Implant Date: 20171208
Lead Channel Impedance Value: 450 Ohm
Lead Channel Impedance Value: 690 Ohm
Lead Channel Pacing Threshold Amplitude: 0.625 V
Lead Channel Pacing Threshold Amplitude: 1 V
Lead Channel Pacing Threshold Pulse Width: 0.5 ms
Lead Channel Pacing Threshold Pulse Width: 0.7 ms
Lead Channel Sensing Intrinsic Amplitude: 12 mV
Lead Channel Setting Pacing Amplitude: 2 V
Lead Channel Setting Pacing Amplitude: 2.5 V
Lead Channel Setting Pacing Pulse Width: 0.5 ms
Lead Channel Setting Pacing Pulse Width: 0.7 ms
Lead Channel Setting Sensing Sensitivity: 4 mV
Pulse Gen Model: 3222
Pulse Gen Serial Number: 7953752

## 2019-12-29 LAB — POCT INR: INR: 2.6 (ref 2.0–3.0)

## 2019-12-29 MED ORDER — WARFARIN SODIUM 6 MG PO TABS: ORAL_TABLET | ORAL | 0 refills | Status: AC

## 2019-12-29 NOTE — Patient Instructions (Signed)
Description   Continue on same dosage 1 tablet daily except 1/2 tablet on Sundays and Thursdays.  Recheck in 5 weeks.  Call our office if you are placed on any new medications or if you have any upcoming procedures [phone].

## 2019-12-30 NOTE — Progress Notes (Signed)
PPM Remote  

## 2020-02-04 ENCOUNTER — Other Ambulatory Visit: Payer: Self-pay

## 2020-02-04 ENCOUNTER — Ambulatory Visit (INDEPENDENT_AMBULATORY_CARE_PROVIDER_SITE_OTHER): Payer: Medicare Other | Admitting: Pharmacist

## 2020-02-04 ENCOUNTER — Encounter: Payer: Self-pay | Admitting: Internal Medicine

## 2020-02-04 ENCOUNTER — Ambulatory Visit (INDEPENDENT_AMBULATORY_CARE_PROVIDER_SITE_OTHER): Payer: Medicare Other | Admitting: Internal Medicine

## 2020-02-04 VITALS — BP 122/68 | HR 75 | Ht 75.0 in | Wt 215.0 lb

## 2020-02-04 DIAGNOSIS — I4891 Unspecified atrial fibrillation: Secondary | ICD-10-CM | POA: Diagnosis not present

## 2020-02-04 DIAGNOSIS — Z95810 Presence of automatic (implantable) cardiac defibrillator: Secondary | ICD-10-CM | POA: Diagnosis not present

## 2020-02-04 DIAGNOSIS — I5022 Chronic systolic (congestive) heart failure: Secondary | ICD-10-CM | POA: Diagnosis not present

## 2020-02-04 DIAGNOSIS — Z7901 Long term (current) use of anticoagulants: Secondary | ICD-10-CM

## 2020-02-04 LAB — CUP PACEART INCLINIC DEVICE CHECK
Battery Remaining Longevity: 86 mo
Battery Voltage: 2.98 V
Brady Statistic RA Percent Paced: 0 %
Brady Statistic RV Percent Paced: 97 %
Date Time Interrogation Session: 20210423112600
Implantable Lead Implant Date: 20060828
Implantable Lead Implant Date: 20060828
Implantable Lead Implant Date: 20110209
Implantable Lead Location: 753858
Implantable Lead Location: 753859
Implantable Lead Location: 753860
Implantable Lead Model: 4194
Implantable Lead Model: 5076
Implantable Pulse Generator Implant Date: 20171208
Lead Channel Impedance Value: 425 Ohm
Lead Channel Impedance Value: 637.5 Ohm
Lead Channel Pacing Threshold Amplitude: 0.75 V
Lead Channel Pacing Threshold Amplitude: 0.75 V
Lead Channel Pacing Threshold Pulse Width: 0.5 ms
Lead Channel Pacing Threshold Pulse Width: 0.7 ms
Lead Channel Setting Pacing Amplitude: 2 V
Lead Channel Setting Pacing Amplitude: 2.5 V
Lead Channel Setting Pacing Pulse Width: 0.5 ms
Lead Channel Setting Pacing Pulse Width: 0.7 ms
Lead Channel Setting Sensing Sensitivity: 4 mV
Pulse Gen Model: 3222
Pulse Gen Serial Number: 7953752

## 2020-02-04 LAB — POCT INR: INR: 2.9 (ref 2.0–3.0)

## 2020-02-04 NOTE — Patient Instructions (Signed)
Medication Instructions:  Your physician recommends that you continue on your current medications as directed. Please refer to the Current Medication list given to you today.  Labwork: None ordered.  Testing/Procedures: None ordered.  Follow-Up: Your physician wants you to follow-up in: one year with Dr. Lovena Le.   You will receive a reminder letter in the mail two months in advance. If you don't receive a letter, please call our office to schedule the follow-up appointment.  Remote monitoring is used to monitor your Pacemaker from home. This monitoring reduces the number of office visits required to check your device to one time per year. It allows [place] to keep an eye on the functioning of your device to ensure it is working properly. You are scheduled for a device check from home on 03/29/2020. You may send your transmission at any time that day. If you have a wireless device, the transmission will be sent automatically. After your physician reviews your transmission, you will receive a postcard with your next transmission date.  Any Other Special Instructions Will Be Listed Below (If Applicable).  If you need a refill on your cardiac medications before your next appointment, please call your pharmacy.

## 2020-02-04 NOTE — Progress Notes (Signed)
HPI Mr. Defina returns today for followup. He is a pleasant 79 yo woman with a h/o CHB, chronic atrial fib, NSVT, chronic systolic heart failure, s/p PPM insertion. He has done well in the interim with no chest pain. He has not gotten the Covid vaccine. He denies syncope, fever, or chills. No Known Allergies   Current Outpatient Medications  Medication Sig Dispense Refill  . allopurinol (ZYLOPRIM) 300 MG tablet Take 300 mg by mouth daily.      . carvedilol (COREG) 12.5 MG tablet Take 1 tablet (12.5 mg total) by mouth 2 (two) times daily. 30 tablet 6  . digoxin (LANOXIN) 0.125 MG tablet Take 0.125 mg by mouth daily.    . ferrous sulfate 325 (65 FE) MG tablet Take 325 mg by mouth daily with breakfast.     . furosemide (LASIX) 80 MG tablet Take 1 tablet (80 mg total) by mouth daily with breakfast. Can take an extra 40 mg as needed for swelling 30 tablet 6  . hydrALAZINE (APRESOLINE) 50 MG tablet Take 50 mg by mouth 3 (three) times daily.    Marland Kitchen HYDROPHILIC EX Apply 1 application topically daily as needed (For dry skin/heels).     Marland Kitchen LANTUS SOLOSTAR 100 UNIT/ML Solostar Pen Inject 14 Units as directed daily.    . metFORMIN (GLUCOPHAGE-XR) 500 MG 24 hr tablet Take 1,000 mg by mouth daily.    Marland Kitchen REFRESH TEARS 0.5 % SOLN Place 1 drop into both eyes as needed for dry eyes.    . sacubitril-valsartan (ENTRESTO) 24-26 MG Take 1 tablet by mouth 2 (two) times daily.    . sildenafil (VIAGRA) 100 MG tablet TAKE 1 TABLET BY MOUTH EVERY DAY AS NEEDED FOR ERECTILE DYSFUNCTION 10 tablet 2  . simvastatin (ZOCOR) 20 MG tablet Take 20 mg by mouth at bedtime.    Marland Kitchen spironolactone (ALDACTONE) 25 MG tablet Take 1 tablet (25 mg total) by mouth daily. 30 tablet 6  . terazosin (HYTRIN) 2 MG capsule Take 2 mg by mouth at bedtime. For help with urination    . warfarin (COUMADIN) 6 MG tablet TAKE 1 TABLET DAILY EXCEPT ONE-HALF (1/2) TABLET ON MONDAY OR AS DIRECTED BY COUMADIN CLINIC (100 TABLETS IS A 90 DAY SUPPLY) 100  tablet 0   No current facility-administered medications for this visit.     Past Medical History:  Diagnosis Date  . Atrial fibrillation (HCC)    Chronic. On coumadin.  . CHF (congestive heart failure) ([place])    secondary to nonischemic cardiomyopathy. EF initially 20-25% up to 50% in 2008. Echo 2/12 EF 20-25% . RHC 3/12: RA 21  RV 81/16/24  PA 81/36 (56) PCWP 33 (v to 43) PA sat 43% 48%.status post Medtronic biventricular ICD placement.(with Sprint Fidelis leads)-explanted--now with St.Jude Anthem BivPM   . Coronary artery disease, non-occlusive    Cath 3/12: mLAD 70%, m-dLAD 40%; Dx 60%; mCFX 30-40%; pRCA 30%)  . Diabetes mellitus   . Hyperlipidemia   . Hypertension   . Obesities, morbid ([place])   . Polycythemia    vera requiring phlebotomy and treatment with hydroxyurea on a chronic bais. The patient followed by the [facility] for this.  . 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   Past Surgical History:  Procedure Laterality Date  . CARDIAC CATHETERIZATION    . EP IMPLANTABLE DEVICE N/A 09/20/2016   Procedure:  BiV Pacemaker Generator Changeout;  Surgeon: Evans Lance, MD;  Location: [facility];  Service: Cardiovascular;  Laterality: N/A;  . INSERT / REPLACE / REMOVE PACEMAKER     ICD - St. Jude     Family History  Family history unknown: Yes     Social History   Socioeconomic History  . Marital status: Married    Spouse name: Not on file  . Number of children: Not on file  . Years of education: Not on file  . Highest education level: Not on file  Occupational History  . Not on file  Tobacco Use  . Smoking status: Former Research scientist (life sciences)  . Smokeless tobacco: Never Used  Substance and Sexual Activity  . Alcohol use: No  . Drug use: No  . Sexual activity: Not on file  Other Topics Concern  . Not on file  Social History Narrative   Retired   Married    Tobacco Use - No    Alcohol Use - No   Social Determinants of Radio broadcast assistant Strain:   . Difficulty of Paying Living Expenses:   Food Insecurity:   . Worried About Charity fundraiser in the Last Year:   . Arboriculturist in the Last Year:   Transportation Needs:   . Film/video editor (Medical):   Marland Kitchen Lack of Transportation (Non-Medical):   Physical Activity:   . Days of Exercise per Week:   . Minutes of Exercise per Session:   Stress:   . Feeling of Stress :   Social Connections:   . Frequency of Communication with Friends and Family:   . Frequency of Social Gatherings with Friends and Family:   . Attends Religious Services:   . Active Member of Clubs or Organizations:   . Attends Archivist Meetings:   Marland Kitchen Marital Status:   Intimate Partner Violence:   . Fear of Current or Ex-Partner:   . Emotionally Abused:   Marland Kitchen Physically Abused:   . Sexually Abused:      BP 122/68   Pulse 75   Ht 6\' 3"  (1.905 m)   Wt 215 lb (97.5 kg)   SpO2 95%   BMI 26.87 kg/m   Physical Exam:  Well appearing NAD HEENT: Unremarkable Neck:  No JVD, no thyromegally Lymphatics:  No adenopathy Back:  No CVA tenderness Lungs:  Clear with no wheezes HEART:  Regular rate rhythm, no murmurs, no rubs, no clicks Abd:  soft, positive bowel sounds, no organomegally, no rebound, no guarding Ext:  2 plus pulses, no edema, no cyanosis, no clubbing Skin:  No rashes no nodules Neuro:  CN II through XII intact, motor grossly intact  DEVICE  Normal device function.  See PaceArt for details.   Assess/Plan: 1. CHB - he is asymptomatic, s/p PPM insertion.  2. PPM - his medtronic dDD PM is working normally.  3. Chronic systolic heart failure - his symptoms are class 2. He will continue his current meds. 4. VT - he has had some NSVT but no sustained episodes. No change in his meds.  Mikle Bosworth.D.

## 2020-02-04 NOTE — Patient Instructions (Signed)
Continue on same dosage 1 tablet daily except 1/2 tablet on Sundays and Thursdays.  Recheck in 6 weeks.  Call our office if you are placed on any new medications or if you have any upcoming procedures [phone].

## 2020-02-07 ENCOUNTER — Telehealth: Payer: Self-pay

## 2020-02-07 NOTE — Telephone Encounter (Signed)
Call placed to Pt.  Advised per Dr. Lovena Le AND Dr. Haroldine Laws Pt should get the covid vaccine.  Pt indicates understanding-gave him phone number [place] [facility]

## 2020-03-14 DEATH — deceased

## 2020-03-21 ENCOUNTER — Encounter (HOSPITAL_COMMUNITY): Payer: Self-pay | Admitting: *Deleted

## 2020-03-21 NOTE — Progress Notes (Signed)
Received pt's death certificate form [place], form completed and signed by Dr Haroldine Laws, [place] aware to p/u
